# Patient Record
Sex: Male | Born: 1960
Health system: Southern US, Community
[De-identification: ages and names within clinical notes are randomized; demographics above are authoritative.]

## PROBLEM LIST (undated history)

## (undated) DIAGNOSIS — I10 Essential (primary) hypertension: Secondary | ICD-10-CM

## (undated) DIAGNOSIS — R10A1 Flank pain, right side: Secondary | ICD-10-CM

## (undated) DIAGNOSIS — M51369 Other intervertebral disc degeneration, lumbar region without mention of lumbar back pain or lower extremity pain: Secondary | ICD-10-CM

## (undated) DIAGNOSIS — K760 Fatty (change of) liver, not elsewhere classified: Secondary | ICD-10-CM

## (undated) DIAGNOSIS — R7303 Prediabetes: Secondary | ICD-10-CM

## (undated) DIAGNOSIS — I2510 Atherosclerotic heart disease of native coronary artery without angina pectoris: Secondary | ICD-10-CM

## (undated) DIAGNOSIS — K5790 Diverticulosis of intestine, part unspecified, without perforation or abscess without bleeding: Secondary | ICD-10-CM

## (undated) DIAGNOSIS — Z8711 Personal history of peptic ulcer disease: Secondary | ICD-10-CM

## (undated) DIAGNOSIS — Z8719 Personal history of other diseases of the digestive system: Secondary | ICD-10-CM

## (undated) DIAGNOSIS — E785 Hyperlipidemia, unspecified: Secondary | ICD-10-CM

## (undated) DIAGNOSIS — M5136 Other intervertebral disc degeneration, lumbar region: Secondary | ICD-10-CM

## (undated) DIAGNOSIS — H532 Diplopia: Secondary | ICD-10-CM

## (undated) DIAGNOSIS — A630 Anogenital (venereal) warts: Secondary | ICD-10-CM

## (undated) DIAGNOSIS — F39 Unspecified mood [affective] disorder: Secondary | ICD-10-CM

## (undated) DIAGNOSIS — I701 Atherosclerosis of renal artery: Secondary | ICD-10-CM

## (undated) DIAGNOSIS — R109 Unspecified abdominal pain: Secondary | ICD-10-CM

## (undated) HISTORY — DX: Diplopia: H53.2

## (undated) HISTORY — DX: Essential (primary) hypertension: I10

## (undated) HISTORY — DX: Prediabetes: R73.03

## (undated) HISTORY — DX: Personal history of peptic ulcer disease: Z87.11

## (undated) HISTORY — DX: Diverticulosis of intestine, part unspecified, without perforation or abscess without bleeding: K57.90

## (undated) HISTORY — DX: Other intervertebral disc degeneration, lumbar region without mention of lumbar back pain or lower extremity pain: M51.369

## (undated) HISTORY — DX: Atherosclerotic heart disease of native coronary artery without angina pectoris: I25.10

## (undated) HISTORY — DX: Anogenital (venereal) warts: A63.0

## (undated) HISTORY — DX: Other intervertebral disc degeneration, lumbar region: M51.36

## (undated) HISTORY — DX: Unspecified mood (affective) disorder: F39

## (undated) HISTORY — DX: Hyperlipidemia, unspecified: E78.5

## (undated) HISTORY — DX: Fatty (change of) liver, not elsewhere classified: K76.0

## (undated) HISTORY — PX: PALATE SURGERY: SHX729

## (undated) HISTORY — DX: Personal history of other diseases of the digestive system: Z87.19

## (undated) HISTORY — DX: Unspecified abdominal pain: R10.9

## (undated) HISTORY — DX: Flank pain, right side: R10.A1

## (undated) HISTORY — DX: Atherosclerosis of renal artery: I70.1

---
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
## 1981-12-11 HISTORY — PX: KNEE SURGERY: SHX244

## 2003-03-16 ENCOUNTER — Emergency Department (HOSPITAL_COMMUNITY): Admission: EM | Admit: 2003-03-16 | Discharge: 2003-03-16 | Payer: Self-pay | Admitting: Emergency Medicine

## 2005-12-11 HISTORY — PX: KNEE SURGERY: SHX244

## 2005-12-11 HISTORY — PX: COLONOSCOPY: SHX174

## 2007-08-21 ENCOUNTER — Observation Stay (HOSPITAL_COMMUNITY): Admission: AD | Admit: 2007-08-21 | Discharge: 2007-08-22 | Payer: Self-pay | Admitting: Interventional Cardiology

## 2007-08-21 ENCOUNTER — Encounter: Payer: Self-pay | Admitting: Emergency Medicine

## 2009-09-13 ENCOUNTER — Encounter: Payer: Self-pay | Admitting: Emergency Medicine

## 2009-09-13 ENCOUNTER — Observation Stay (HOSPITAL_COMMUNITY): Admission: EM | Admit: 2009-09-13 | Discharge: 2009-09-15 | Payer: Self-pay | Admitting: Cardiovascular Disease

## 2009-09-13 ENCOUNTER — Ambulatory Visit: Payer: Self-pay | Admitting: Diagnostic Radiology

## 2009-09-14 HISTORY — PX: CORONARY STENT PLACEMENT: SHX1402

## 2009-12-11 DIAGNOSIS — I701 Atherosclerosis of renal artery: Secondary | ICD-10-CM

## 2009-12-11 HISTORY — DX: Atherosclerosis of renal artery: I70.1

## 2010-02-01 ENCOUNTER — Inpatient Hospital Stay (HOSPITAL_COMMUNITY): Admission: EM | Admit: 2010-02-01 | Discharge: 2010-02-02 | Payer: Self-pay | Admitting: Emergency Medicine

## 2010-09-27 ENCOUNTER — Ambulatory Visit: Payer: Self-pay | Admitting: Family Medicine

## 2010-09-27 ENCOUNTER — Encounter
Admission: RE | Admit: 2010-09-27 | Discharge: 2010-11-23 | Payer: Self-pay | Source: Home / Self Care | Attending: Family Medicine | Admitting: Family Medicine

## 2011-01-10 NOTE — Assessment & Plan Note (Signed)
Summary: RT SHOULDER PAIN/NP/LP   Vital Signs:  Patient profile:   50 year old male Height:      67.6 inches Weight:      225 pounds BMI:     34.74 Pulse rate:   73 / minute BP sitting:   180 / 90  (left arm)  Vitals Entered By: Terese Door (September 27, 2010 1:49 PM)  Serial Vital Signs/Assessments:  Time      Position  BP       Pulse  Resp  Temp     By                     166/105                        Terese Door 2:17 PM             178/105                        Terese Door  CC: RIGHT SIDED NECK/SHOULDER PAIN   CC:  RIGHT SIDED NECK/SHOULDER PAIN.  History of Present Illness: 50 yo M here for right sided neck pain  Patient reports about 2 weeks of right sided neck pain No known injury but did work more in yard and cutting trees down the day before pain started Pain mostly posterior neck and upper back. Occasional paresthesias into right hand only. No h/o neck problems. No weakness No bowel or bladder issues. Has tried some otc nsaids but not improving. Also tried massage.   Allergies (verified): 1)  ! Penicillin  Past History:  Past Medical History: CAD, s/p stent placement HTN Renal stenosis HLD  Family History: + heart disease in siblings and father + HTN in siblings and father no DM  Social History: nonsmoker, nondrinker.  Is an EMT.  Physical Exam  General:  Well-developed,well-nourished,in no acute distress; alert,appropriate and cooperative throughout examination Msk:  Neck: No gross deformity, swelling, bruising.  Spasm in right trapezius near occiput and mid-thoracic region. TTP in these areas noted above. FROM but pain in right side of neck with right lat rotation. Strength 5/5 BUEs. Reflexes 2+ and equal in biceps, triceps, brachioradialis tendons. Negative spurlings bilaterally.  R Shoulder: Inspection reveals no abnormalities, atrophy or asymmetry. Palpation is normal with no tenderness over AC joint or bicipital groove. ROM is  full in all planes. Rotator cuff strength normal throughout. No signs of impingement with negative Neer and Hawkin's tests, empty can. Speeds and Yergason's tests normal. Normal scapular function observed. No painful arc and no drop arm sign. No apprehension sign.   Impression & Recommendations:  Problem # 1:  CERVICAL RADICULOPATHY, RIGHT (ICD-723.4) Assessment New Symptoms coming from neck - most likely radiculopathy as inciting event.  Does not remember pull within right trapezius for muscle strain to be inciting event.  Treat conservatively with PT, tylenol, heat, massage, muscle relaxant.  if not improving consider further imaging - xrays and mri scan with possible ESIs or trial of neurontin.  Complete Medication List: 1)  Flexeril 10 Mg Tabs (Cyclobenzaprine hcl) .Marland Kitchen.. 1 tab by mouth every 8 hours as needed for muscle spasms 2)  Losartan Potassium 25 Mg Tabs (Losartan potassium) 3)  Plavix 75 Mg Tabs (Clopidogrel bisulfate) 4)  Pravachol 20 Mg Tabs (Pravastatin sodium) 5)  Citalopram Hydrobromide 20 Mg Tabs (Citalopram hydrobromide) 6)  Aspirin 325 Mg Tabs (Aspirin)  Patient Instructions: 1)  Your  symptoms are related to nerve irritation coming out of your neck (responsible for the spasm, paresthesias in your arm). 2)  Initial treatment of this is conservative. 3)  Take tylenol 1000mg  three times a day as needed (avoid anti-inflammatories if possible because this will increase your blood pressure and increases the risk of heart problems albeit mildly). 4)  Take flexeril 10mg  three times a day as needed for spasms - no driving on this though because it can make you sleepy. 5)  Heat 15 minutes at a time 3-4 times a day for spasms. 6)  Massage may be helpful. 7)  Start physical therapy 2 times a week for next 4 weeks for exercises, modalities, stretches to help with this. 8)  If symptoms into arm, weakness, more persistent numbness we can try a steroid dose pack. 9)  Follow up with  me in 4 weeks. 10)  If worsening or not improving with this conservative approach, we would do x-rays and consider MRI with trial of steroid injections into the area of nerve irritation by the radiologist Prescriptions: FLEXERIL 10 MG TABS (CYCLOBENZAPRINE HCL) 1 tab by mouth every 8 hours as needed for muscle spasms  #30 x 1   Entered and Authorized by:   Norton Blizzard MD   Signed by:   Norton Blizzard MD on 09/27/2010   Method used:   Print then Give to Patient   RxID:   [id number]    Orders Added: 1)  New Patient Level III [99203]

## 2011-03-01 LAB — DIFFERENTIAL
Basophils Relative: 0 % (ref 0–1)
Lymphocytes Relative: 26 % (ref 12–46)
Lymphs Abs: 1.7 10*3/uL (ref 0.7–4.0)
Monocytes Relative: 6 % (ref 3–12)
Neutro Abs: 4.1 10*3/uL (ref 1.7–7.7)

## 2011-03-01 LAB — POCT CARDIAC MARKERS: Troponin i, poc: <0.05 ng/mL (ref 0.00–0.09)

## 2011-03-01 LAB — COMPREHENSIVE METABOLIC PANEL
AST: 21 U/L (ref 0–37)
Albumin: 4.2 g/dL (ref 3.5–5.2)
Alkaline Phosphatase: 38 U/L — ABNORMAL LOW (ref 39–117)
BUN: 10 mg/dL (ref 6–23)
CO2: 25 mEq/L (ref 19–32)
Creatinine, Ser: 1.08 mg/dL (ref 0.4–1.5)
GFR calc Af Amer: >60 mL/min (ref >60)
Glucose, Bld: 97 mg/dL (ref 70–99)
Sodium: 137 mEq/L (ref 135–145)
Total Bilirubin: 1.4 mg/dL — ABNORMAL HIGH (ref 0.3–1.2)
Total Protein: 7.1 g/dL (ref 6.0–8.3)

## 2011-03-01 LAB — CK TOTAL AND CKMB (NOT AT ARMC)
CK, MB: 1.7 ng/mL (ref 0.3–4.0)
Total CK: 201 U/L (ref 7–232)

## 2011-03-01 LAB — CBC
HCT: 41.1 % (ref 39.0–52.0)
Platelets: 161 10*3/uL (ref 150–400)
Platelets: 169 10*3/uL (ref 150–400)
RBC: 4.68 MIL/uL (ref 4.22–5.81)
RDW: 13.2 % (ref 11.5–15.5)
WBC: 6.3 10*3/uL (ref 4.0–10.5)
WBC: 6.4 10*3/uL (ref 4.0–10.5)

## 2011-03-01 LAB — CARDIAC PANEL(CRET KIN+CKTOT+MB+TROPI)
CK, MB: 1.6 ng/mL (ref 0.3–4.0)
CK, MB: 1.6 ng/mL (ref 0.3–4.0)
Relative Index: 0.9 (ref 0.0–2.5)
Relative Index: 1.2 (ref 0.0–2.5)
Total CK: 174 U/L (ref 7–232)

## 2011-03-01 LAB — LIPID PANEL: HDL: 39 mg/dL — ABNORMAL LOW (ref >39)

## 2011-03-01 LAB — BASIC METABOLIC PANEL
CO2: 26 mEq/L (ref 19–32)
Calcium: 9.2 mg/dL (ref 8.4–10.5)
Chloride: 104 mEq/L (ref 96–112)
GFR calc Af Amer: >60 mL/min (ref >60)
GFR calc non Af Amer: >60 mL/min (ref >60)
Potassium: 3.6 mEq/L (ref 3.5–5.1)
Sodium: 138 mEq/L (ref 135–145)

## 2011-03-01 LAB — PROTIME-INR: INR: 1.03 (ref 0.00–1.49)

## 2011-03-01 LAB — APTT: aPTT: 28 seconds (ref 24–37)

## 2011-03-16 LAB — OPIATE, QUANTITATIVE, URINE
Codeine Urine: NEGATIVE ng/mL
Hydrocodone: NEGATIVE ng/mL
Hydromorphone GC/MS Conf: NEGATIVE ng/mL
Morphine, Confirm: 2990 ng/mL
Oxymorphone: NEGATIVE ng/mL

## 2011-03-16 LAB — LIPID PANEL: Triglycerides: 180 mg/dL — ABNORMAL HIGH (ref <150)

## 2011-03-16 LAB — DRUGS OF ABUSE SCREEN W/O ALC, ROUTINE URINE
Barbiturate Quant, Ur: NEGATIVE
Benzodiazepines.: NEGATIVE
Cocaine Metabolites: NEGATIVE
Methadone: NEGATIVE
Propoxyphene: NEGATIVE

## 2011-03-16 LAB — HEMOGLOBIN A1C: Mean Plasma Glucose: 128 mg/dL

## 2011-03-16 LAB — CBC
HCT: 37 % — ABNORMAL LOW (ref 39.0–52.0)
HCT: 37.5 % — ABNORMAL LOW (ref 39.0–52.0)
MCHC: 34.1 g/dL (ref 30.0–36.0)
MCV: 87.3 fL (ref 78.0–100.0)
MCV: 87.3 fL (ref 78.0–100.0)
MCV: 87.1 fL (ref 78.0–100.0)
Platelets: 168 10*3/uL (ref 150–400)
Platelets: 185 10*3/uL (ref 150–400)
Platelets: 209 10*3/uL (ref 150–400)
RBC: 5.03 MIL/uL (ref 4.22–5.81)
RDW: 13.5 % (ref 11.5–15.5)
WBC: 7.3 10*3/uL (ref 4.0–10.5)
WBC: 7.6 10*3/uL (ref 4.0–10.5)
WBC: 8.4 10*3/uL (ref 4.0–10.5)

## 2011-03-16 LAB — BASIC METABOLIC PANEL
BUN: 11 mg/dL (ref 6–23)
BUN: 17 mg/dL (ref 6–23)
BUN: 17 mg/dL (ref 6–23)
CO2: 29 mEq/L (ref 19–32)
Calcium: 9.1 mg/dL (ref 8.4–10.5)
Calcium: 10 mg/dL (ref 8.4–10.5)
Chloride: 108 mEq/L (ref 96–112)
Chloride: 102 mEq/L (ref 96–112)
Creatinine, Ser: 1.11 mg/dL (ref 0.4–1.5)
Creatinine, Ser: 1.15 mg/dL (ref 0.4–1.5)
Creatinine, Ser: 1.2 mg/dL (ref 0.4–1.5)
GFR calc Af Amer: >60 mL/min (ref >60)
GFR calc non Af Amer: >60 mL/min (ref >60)
GFR calc non Af Amer: >60 mL/min (ref >60)
GFR calc non Af Amer: >60 mL/min (ref >60)
Glucose, Bld: 109 mg/dL — ABNORMAL HIGH (ref 70–99)
Glucose, Bld: 114 mg/dL — ABNORMAL HIGH (ref 70–99)
Potassium: 3.4 mEq/L — ABNORMAL LOW (ref 3.5–5.1)
Sodium: 139 mEq/L (ref 135–145)

## 2011-03-16 LAB — PROTIME-INR: Prothrombin Time: 12.1 seconds (ref 11.6–15.2)

## 2011-03-16 LAB — URINALYSIS, MICROSCOPIC ONLY
Hgb urine dipstick: NEGATIVE
Nitrite: NEGATIVE
Specific Gravity, Urine: 1.024 (ref 1.005–1.030)
Urobilinogen, UA: 0.2 mg/dL (ref 0.0–1.0)
pH: 5.5 (ref 5.0–8.0)

## 2011-03-16 LAB — DIFFERENTIAL
Eosinophils Absolute: 0.2 10*3/uL (ref 0.0–0.7)
Lymphocytes Relative: 29 % (ref 12–46)
Lymphs Abs: 2.4 10*3/uL (ref 0.7–4.0)
Monocytes Relative: 5 % (ref 3–12)
Neutro Abs: 5.3 10*3/uL (ref 1.7–7.7)
Neutrophils Relative %: 63 % (ref 43–77)

## 2011-03-16 LAB — CARDIAC PANEL(CRET KIN+CKTOT+MB+TROPI)
CK, MB: 3 ng/mL (ref 0.3–4.0)
CK, MB: 3.9 ng/mL (ref 0.3–4.0)
Relative Index: 3.2 — ABNORMAL HIGH (ref 0.0–2.5)
Troponin I: 0.31 ng/mL — ABNORMAL HIGH (ref 0.00–0.06)
Troponin I: 0.38 ng/mL — ABNORMAL HIGH (ref 0.00–0.06)

## 2011-03-16 LAB — POCT CARDIAC MARKERS
CKMB, poc: 3.1 ng/mL (ref 1.0–8.0)
Myoglobin, poc: 86.5 ng/mL (ref 12–200)
Troponin i, poc: 0.15 ng/mL — ABNORMAL HIGH (ref 0.00–0.09)

## 2011-03-16 LAB — D-DIMER, QUANTITATIVE: D-Dimer, Quant: 0.26 ug/mL-FEU (ref 0.00–0.48)

## 2011-03-16 LAB — HEPARIN LEVEL (UNFRACTIONATED): Heparin Unfractionated: 0.6 IU/mL (ref 0.30–0.70)

## 2011-04-25 NOTE — Discharge Summary (Signed)
NAMENALU, TROUBLEFIELD            ACCOUNT NO.:  [id number]   MEDICAL RECORD NO.:  [id number]          PATIENT TYPE:  INP   LOCATION:  4735                         FACILITY:  [facility]   PHYSICIAN:  Corky Crafts, MDDATE OF BIRTH:  03/05/1961   DATE OF ADMISSION:  08/21/2007  DATE OF DISCHARGE:  08/22/2007                               DISCHARGE SUMMARY   DISCHARGE DIAGNOSES:  1. Chest pain consistent with unstable angina.  2. Hypertension.  3. Metabolic syndrome.  4. Family history coronary artery disease.   HISTORY OF PRESENT ILLNESS:  Mr. Elwood is a 50 year old male  patient who had told a history of nausea, vomiting and diarrhea.  On the  night prior to admission, he developed substernal chest pressure,  radiating into the left shoulder and neck.  He has had intermittent  symptoms and then presented to the emergency room.  In the emergency  room, he was found to be bradycardic with a heart rate in the 40s.  He  did present [facility] and was then subsequently sent over to  [facility] for careful cardiac monitoring.   Cardiac enzymes were negative.  BUN 13, creatinine 1.0.  Hemoglobin  14.2, hematocrit 41.1.  Total cholesterol 167, LDL 91, HDL 24,  triglycerides 260.   He was pain free by the day of discharge.  We have scheduled him for an  outpatient Cardiolite on August 28, 2007, at 1 p.m.  He is not to eat  12 hours before the stress test.   MEDICATIONS:  1. Enteric-coated aspirin 325 mg a day.  2. Norvasc 5 mg a day for blood pressure.  3. Sublingual nitroglycerin p.r.n. chest pain.  4. Fish oil 1000 mg p.o. b.i.d.  5. He is not to take his bystolic.  We felt this medication had caused      bradycardia.   DIET:  Remain on a low-sodium, heart-healthy diet.   ACTIVITY:  Increase activity slowly.   Call for any questions or concerns.      Guy Franco, P.A.      Corky Crafts, MD  Electronically Signed    LB/MEDQ  D:   08/22/2007  T:  08/22/2007  Job:  [id number]   cc:   Elana Alm. Nicholos Johns, M.D.

## 2011-08-01 ENCOUNTER — Ambulatory Visit (INDEPENDENT_AMBULATORY_CARE_PROVIDER_SITE_OTHER): Payer: 59 | Admitting: Family Medicine

## 2011-08-01 ENCOUNTER — Encounter: Payer: Self-pay | Admitting: Family Medicine

## 2011-08-01 VITALS — BP 132/89 | HR 71 | Temp 97.8°F | Ht 67.0 in | Wt 230.0 lb

## 2011-08-01 DIAGNOSIS — M25562 Pain in left knee: Secondary | ICD-10-CM

## 2011-08-01 DIAGNOSIS — M25569 Pain in unspecified knee: Secondary | ICD-10-CM

## 2011-08-01 NOTE — Assessment & Plan Note (Signed)
consistent with degenerative medial meniscal tear.  He will drop off a CD of his x-ray images for me to review.  ACE wrap, icing, elevation for swelling.  Cortisone injection may still help him.  Will start PT and home exercises with aggressive quad strengthening.  If not improving over next month, would then consider MRI and possible arthroscopy (especially if his occasional mechanical symptoms continue despite PT).

## 2011-08-01 NOTE — Patient Instructions (Signed)
Your history and exam are consistent with either arthritis on the inside of your knee or, more likely, a degenerative meniscal tear. Ice your knee 15 minutes at a time 3-4 times a day as needed for swelling. ACE wrap and elevation to help keep swelling down. The cortisone injection may still take effect in calming down the inflammation from this. Start physical therapy and home exercises for the next 6 weeks. Follow up with me in 1 month - if not improving, next step would be to do an MRI and you may then need a scope to clean out a meniscal tear. When you get a chance, get a copy of your x-rays on a CD and drop the CD off up front for me. Glucosamine sulfate 750mg  twice a day is a supplement that has been shown to help moderate to severe arthritis - this is very safe if you're not allergic to sulfa but generally would not help a meniscal tear.

## 2011-08-01 NOTE — Progress Notes (Signed)
  Subjective:    Patient ID: Eric Hunter, male    DOB: Aug 22, 1961, 50 y.o.   MRN: [id number]  HPI 50 yo M here for left knee pain.  Patient reports no known injury. States about 3 months ago started to get anteromedial left knee pain when using stairs. Started as an ache that has progressively worsened since then, worse with activity. Knee swelling up now. Feels more unstable, occasional catching/locking as well. Has been icing and taking ibuprofen. Went to Dr. Althea Charon last Wednesday - told x-rays showed mild spur behind patella but no mention of medial arthritis - had 30cc drained from knee and cortisone injection. Has not had much pain relief from this and knee is swelling again. No remote surgeries or injuries either.  Past Medical History  Diagnosis Date  . Coronary artery disease   . Hypertension   . Renal artery stenosis   . Hyperlipidemia     No current outpatient prescriptions on file prior to visit.    Past Surgical History  Procedure Date  . Coronary stent placement     Allergies  Allergen Reactions  . Penicillins     History   Social History  . Marital Status: Single    Spouse Name: N/A    Number of Children: N/A  . Years of Education: N/A   Occupational History  . Not on file.   Social History Main Topics  . Smoking status: Never Smoker   . Smokeless tobacco: Not on file  . Alcohol Use: Not on file  . Drug Use: Not on file  . Sexually Active: Not on file   Other Topics Concern  . Not on file   Social History Narrative  . No narrative on file    Family History  Problem Relation Age of Onset  . Heart attack Father   . Hypertension Father   . Heart attack Sister   . Hypertension Sister   . Heart attack Brother   . Hypertension Brother   . Diabetes Neg Hx     BP 132/89  Pulse 71  Temp(Src) 97.8 F (36.6 C) (Oral)  Ht 5\' 7"  (1.702 m)  Wt 230 lb (104.327 kg)  BMI 36.02 kg/m2  Review of Systems See HPI above.    Objective:    Physical Exam Gen: NAD L knee: Mild effusion.  No gross deformity, ecchymoses, warmth. Mod medial joint line TTP.  No lateral joint line, post patellar facet, pes, other TTP about knee. FROM - tight with full flexion. Negative ant/post drawers. Negative valgus/varus testing. Negative lachmanns. Positive mcmurrays medially.  Positive apleys medially.  Negative patellar apprehension, clarkes. NV intact distally.  R knee: FROM without pain.    Assessment & Plan:  1. Left knee pain - consistent with degenerative medial meniscal tear.  He will drop off a CD of his x-ray images for me to review.  ACE wrap, icing, elevation for swelling.  Cortisone injection may still help him.  Will start PT and home exercises with aggressive quad strengthening.  If not improving over next month, would then consider MRI and possible arthroscopy (especially if his occasional mechanical symptoms continue despite PT).

## 2011-08-08 ENCOUNTER — Ambulatory Visit: Payer: 59 | Attending: Family Medicine | Admitting: Physical Therapy

## 2011-09-01 ENCOUNTER — Ambulatory Visit: Payer: 59 | Admitting: Family Medicine

## 2011-09-22 LAB — CARDIAC PANEL(CRET KIN+CKTOT+MB+TROPI)
CK, MB: 2.3
Relative Index: INVALID
Troponin I: 0.02

## 2011-09-22 LAB — HEPATIC FUNCTION PANEL
ALT: 33
AST: 21
Alkaline Phosphatase: 34 — ABNORMAL LOW
Bilirubin, Direct: 0.1
Total Bilirubin: 1.2

## 2011-09-22 LAB — PROTIME-INR: INR: 1

## 2011-09-22 LAB — TSH: TSH: 2.187

## 2011-09-22 LAB — BASIC METABOLIC PANEL
CO2: 24
Calcium: 9.6
GFR calc Af Amer: >60
GFR calc non Af Amer: >60
Glucose, Bld: 123 — ABNORMAL HIGH
Potassium: 3.9
Sodium: 140

## 2011-09-22 LAB — POCT CARDIAC MARKERS
CKMB, poc: 1
CKMB, poc: <1 — ABNORMAL LOW
Myoglobin, poc: 58.4
Troponin i, poc: <0.05
Troponin i, poc: <0.05

## 2011-09-22 LAB — LIPID PANEL
Cholesterol: 167
HDL: 24 — ABNORMAL LOW
LDL Cholesterol: 91
Total CHOL/HDL Ratio: 7

## 2011-09-22 LAB — DIFFERENTIAL
Eosinophils Relative: 3
Lymphocytes Relative: 29
Monocytes Absolute: 0.5
Monocytes Relative: 9
Neutro Abs: 3.1

## 2011-09-22 LAB — CBC
HCT: 41.1
Hemoglobin: 14.2
MCHC: 34.6
RBC: 4.87

## 2012-07-17 ENCOUNTER — Encounter (HOSPITAL_BASED_OUTPATIENT_CLINIC_OR_DEPARTMENT_OTHER): Payer: Self-pay | Admitting: *Deleted

## 2012-07-17 ENCOUNTER — Emergency Department (HOSPITAL_BASED_OUTPATIENT_CLINIC_OR_DEPARTMENT_OTHER): Payer: 59

## 2012-07-17 ENCOUNTER — Emergency Department (HOSPITAL_BASED_OUTPATIENT_CLINIC_OR_DEPARTMENT_OTHER)
Admission: EM | Admit: 2012-07-17 | Discharge: 2012-07-17 | Disposition: A | Discharge status: Home / Self Care | Payer: 59 | Attending: Emergency Medicine | Admitting: Emergency Medicine

## 2012-07-17 DIAGNOSIS — R61 Generalized hyperhidrosis: Secondary | ICD-10-CM | POA: Insufficient documentation

## 2012-07-17 DIAGNOSIS — R110 Nausea: Secondary | ICD-10-CM | POA: Insufficient documentation

## 2012-07-17 DIAGNOSIS — E785 Hyperlipidemia, unspecified: Secondary | ICD-10-CM | POA: Insufficient documentation

## 2012-07-17 DIAGNOSIS — I2510 Atherosclerotic heart disease of native coronary artery without angina pectoris: Secondary | ICD-10-CM | POA: Insufficient documentation

## 2012-07-17 DIAGNOSIS — Z79899 Other long term (current) drug therapy: Secondary | ICD-10-CM | POA: Insufficient documentation

## 2012-07-17 DIAGNOSIS — I10 Essential (primary) hypertension: Secondary | ICD-10-CM | POA: Insufficient documentation

## 2012-07-17 DIAGNOSIS — N200 Calculus of kidney: Secondary | ICD-10-CM | POA: Insufficient documentation

## 2012-07-17 DIAGNOSIS — R109 Unspecified abdominal pain: Secondary | ICD-10-CM | POA: Insufficient documentation

## 2012-07-17 LAB — URINALYSIS, ROUTINE W REFLEX MICROSCOPIC
Ketones, ur: NEGATIVE mg/dL
Leukocytes, UA: NEGATIVE
Nitrite: NEGATIVE
Specific Gravity, Urine: 1.026 (ref 1.005–1.030)
pH: 7 (ref 5.0–8.0)

## 2012-07-17 LAB — BASIC METABOLIC PANEL
Calcium: 9.7 mg/dL (ref 8.4–10.5)
Creatinine, Ser: 1.1 mg/dL (ref 0.50–1.35)
GFR calc Af Amer: 87 mL/min — ABNORMAL LOW (ref >90)

## 2012-07-17 MED ORDER — OXYCODONE-ACETAMINOPHEN 5-325 MG PO TABS: 1.0000 | ORAL_TABLET | ORAL | Status: DC | PRN

## 2012-07-17 MED ORDER — KETOROLAC TROMETHAMINE 30 MG/ML IJ SOLN
30.0000 mg | Freq: Once | INTRAMUSCULAR | Status: AC
  Administered 2012-07-17: 30 mg via INTRAVENOUS
  Filled 2012-07-17: qty 1

## 2012-07-17 MED ORDER — NAPROXEN 500 MG PO TABS: 500.0000 mg | ORAL_TABLET | Freq: Two times a day (BID) | ORAL | Status: DC

## 2012-07-17 MED ORDER — PROMETHAZINE HCL 25 MG PO TABS: 25.0000 mg | ORAL_TABLET | Freq: Four times a day (QID) | ORAL | Status: DC | PRN

## 2012-07-17 NOTE — ED Notes (Signed)
Pt to room 8 by ems, pt is awake and alert, reports sudden onset of right flank pain  While mowing his grass just pta. ems reports pt was pale, diaphoretic on their arrival. #20 in place to right hand, ems gave 200 mcg of fentanyl en route. Pt reports pain "is much better now."

## 2012-07-17 NOTE — ED Provider Notes (Signed)
History     CSN: [id number]  Arrival date & time 07/17/12  1901   First MD Initiated Contact with Patient 07/17/12 1938      Chief Complaint  Patient presents with  . Flank Pain    (Consider location/radiation/quality/duration/timing/severity/associated sxs/prior treatment) HPI Comments: 51 year old with a history of coronary disease, hypertension and hyperlipidemia who presents with a complaint of acute onset of right flank pain which was sharp and stabbing, occurred at rest and there is no comfortable position with which he could find. He has associated nausea and diaphoresis but no vomiting. He denies fevers chills coughing shortness of breath abdominal pain, dysuria, hematuria, swelling, rashes. Paramedics gave the patient fentanyl and route with complete resolution of his pain. It has not recurred since that time. He has no history of kidney stones we does have intermittent diverticulitis causing left lower quadrant discomfort. He did have a bout of this last week. He denies changes in his bowel habits his  Patient is a 51 y.o. male presenting with flank pain. The history is provided by the patient, the EMS personnel and the spouse.  Flank Pain    Past Medical History  Diagnosis Date  . Coronary artery disease   . Hypertension   . Renal artery stenosis   . Hyperlipidemia     Past Surgical History  Procedure Date  . Coronary stent placement     Family History  Problem Relation Age of Onset  . Heart attack Father   . Hypertension Father   . Heart attack Sister   . Hypertension Sister   . Heart attack Brother   . Hypertension Brother   . Diabetes Neg Hx     History  Substance Use Topics  . Smoking status: Never Smoker   . Smokeless tobacco: Not on file  . Alcohol Use: Not on file      Review of Systems  Genitourinary: Positive for flank pain.  All other systems reviewed and are negative.    Allergies  Penicillins  Home Medications   Current  Outpatient Rx  Name Route Sig Dispense Refill  . ASPIRIN 325 MG PO TABS Oral Take 325 mg by mouth daily.    Marland Kitchen CITALOPRAM HYDROBROMIDE 20 MG PO TABS Oral Take 20 mg by mouth daily.    Marland Kitchen CLOPIDOGREL BISULFATE 75 MG PO TABS Oral Take 75 mg by mouth daily.     Marland Kitchen LOSARTAN POTASSIUM 25 MG PO TABS Oral Take 25 mg by mouth daily.     . CENTRUM SILVER ULTRA MENS PO Oral Take 1 tablet by mouth daily.    Marland Kitchen CITALOPRAM HYDROBROMIDE 20 MG PO TABS Oral Take 20 mg by mouth daily.      Marland Kitchen NAPROXEN 500 MG PO TABS Oral Take 1 tablet (500 mg total) by mouth 2 (two) times daily with a meal. 30 tablet 0  . OXYCODONE-ACETAMINOPHEN 5-325 MG PO TABS Oral Take 1 tablet by mouth every 4 (four) hours as needed for pain. 20 tablet 0  . PRAVASTATIN SODIUM 20 MG PO TABS Oral Take 20 mg by mouth daily.      Marland Kitchen PROMETHAZINE HCL 25 MG PO TABS Oral Take 1 tablet (25 mg total) by mouth every 6 (six) hours as needed for nausea. 12 tablet 0    BP 154/78  Pulse 63  Temp 97.7 F (36.5 C) (Oral)  Resp 18  Ht 5\' 7"  (1.702 m)  Wt 214 lb (97.07 kg)  BMI 33.52 kg/m2  SpO2 96%  Physical  Exam  Nursing note and vitals reviewed. Constitutional: He appears well-developed and well-nourished. No distress.  HENT:  Head: Normocephalic and atraumatic.  Mouth/Throat: Oropharynx is clear and moist. No oropharyngeal exudate.  Eyes: Conjunctivae and EOM are normal. Pupils are equal, round, and reactive to light. Right eye exhibits no discharge. Left eye exhibits no discharge. No scleral icterus.  Neck: Normal range of motion. Neck supple. No JVD present. No thyromegaly present.  Cardiovascular: Normal rate, regular rhythm, normal heart sounds and intact distal pulses.  Exam reveals no gallop and no friction rub.   No murmur heard. Pulmonary/Chest: Effort normal and breath sounds normal. No respiratory distress. He has no wheezes. He has no rales.  Abdominal: Soft. Bowel sounds are normal. He exhibits no distension and no mass. There is no  tenderness.       No abdominal tenderness, soft and no peritoneal signs, no CVA tenderness  Musculoskeletal: Normal range of motion. He exhibits no edema and no tenderness.  Lymphadenopathy:    He has no cervical adenopathy.  Neurological: He is alert. Coordination normal.  Skin: Skin is warm and dry. No rash noted. No erythema.  Psychiatric: He has a normal mood and affect. His behavior is normal.    ED Course  Procedures (including critical care time)  Labs Reviewed  BASIC METABOLIC PANEL - Abnormal; Notable for the following:    Glucose, Bld 106 (*)     GFR calc non Af Amer 75 (*)     GFR calc Af Amer 87 (*)     All other components within normal limits  URINALYSIS, ROUTINE W REFLEX MICROSCOPIC   Ct Abdomen Pelvis Wo Contrast  07/17/2012  *RADIOLOGY REPORT*  Clinical Data: Right flank pain  CT ABDOMEN AND PELVIS WITHOUT CONTRAST  Technique:  Multidetector CT imaging of the abdomen and pelvis was performed following the standard protocol without intravenous contrast.  Comparison: None.  Findings: Lung bases are unremarkable.  Sagittal images of the spine shows disc space flattening at T12 L1 T11-T12 L3-L4 and L4-L5 level.  Unenhanced liver is unremarkable.  No calcified gallstones are noted within gallbladder.  Unenhanced pancreas, spleen and adrenal glands are unremarkable.  Unenhanced kidneys are symmetrical in size.  No nephrolithiasis.  No calcified ureteral calculi are noted bilaterally.  No hydronephrosis or hydroureter.  Multiple colonic diverticula are noted without evidence of acute diverticulitis.  No pericecal inflammation.  Normal appendix is clearly visualized.  No aortic aneurysm.  No small bowel obstruction.  No ascites or free air.  No adenopathy.  Prostate gland and seminal vesicles are unremarkable.  The urinary bladder is unremarkable.  Bilateral distal ureter is unremarkable. Tiny prostate gland calcification measures 3 mm.  IMPRESSION:  1.  No nephrolithiasis.  No  hydronephrosis or hydroureter. 2.  No calcified ureteral calculi are noted. 3.  Colonic diverticula are noted without evidence of acute diverticulitis. 4.  Normal appendix is clearly visualized. 5.  Degenerative changes thoracolumbar spine.  Original Report Authenticated By: Natasha Mead, M.D.     1. Kidney stone       MDM  At this time the patient is well-appearing, he has no symptoms, will give Toradol, short observation, I have given the patient the option of doing a CT scan to further evaluate for what is likely a kidney stone.  CT scan reviewed, no signs of acute kidney stone or obstruction. Kidney function is baseline, urinalysis is clean and the patient has remained asymptomatic. Home with medications and Uro f/u as needed  Discharge Prescriptions include:  Naprosyn Percocet phenergan      Vida Roller, MD 07/17/12 2114

## 2012-07-19 ENCOUNTER — Encounter (HOSPITAL_BASED_OUTPATIENT_CLINIC_OR_DEPARTMENT_OTHER): Payer: Self-pay

## 2012-07-19 ENCOUNTER — Emergency Department (HOSPITAL_BASED_OUTPATIENT_CLINIC_OR_DEPARTMENT_OTHER)
Admission: EM | Admit: 2012-07-19 | Discharge: 2012-07-19 | Disposition: A | Discharge status: Home / Self Care | Payer: 59 | Attending: Emergency Medicine | Admitting: Emergency Medicine

## 2012-07-19 DIAGNOSIS — R109 Unspecified abdominal pain: Secondary | ICD-10-CM

## 2012-07-19 DIAGNOSIS — Z87891 Personal history of nicotine dependence: Secondary | ICD-10-CM | POA: Insufficient documentation

## 2012-07-19 DIAGNOSIS — Z9861 Coronary angioplasty status: Secondary | ICD-10-CM | POA: Insufficient documentation

## 2012-07-19 DIAGNOSIS — E785 Hyperlipidemia, unspecified: Secondary | ICD-10-CM | POA: Insufficient documentation

## 2012-07-19 DIAGNOSIS — I2510 Atherosclerotic heart disease of native coronary artery without angina pectoris: Secondary | ICD-10-CM | POA: Insufficient documentation

## 2012-07-19 DIAGNOSIS — I10 Essential (primary) hypertension: Secondary | ICD-10-CM | POA: Insufficient documentation

## 2012-07-19 MED ORDER — FENTANYL CITRATE 0.05 MG/ML IJ SOLN
100.0000 ug | Freq: Once | INTRAMUSCULAR | Status: AC
  Administered 2012-07-19: 100 ug via INTRAVENOUS

## 2012-07-19 MED ORDER — ONDANSETRON HCL 4 MG/2ML IJ SOLN
INTRAMUSCULAR | Status: AC
  Filled 2012-07-19: qty 2

## 2012-07-19 MED ORDER — LORAZEPAM 1 MG PO TABS: 1.0000 mg | ORAL_TABLET | Freq: Four times a day (QID) | ORAL | Status: AC | PRN

## 2012-07-19 MED ORDER — LORAZEPAM 2 MG/ML IJ SOLN
2.0000 mg | Freq: Once | INTRAMUSCULAR | Status: AC
  Administered 2012-07-19: 2 mg via INTRAVENOUS

## 2012-07-19 MED ORDER — ONDANSETRON HCL 4 MG/2ML IJ SOLN
4.0000 mg | Freq: Once | INTRAMUSCULAR | Status: AC
  Administered 2012-07-19: 4 mg via INTRAVENOUS

## 2012-07-19 MED ORDER — LORAZEPAM 2 MG/ML IJ SOLN
INTRAMUSCULAR | Status: AC
  Filled 2012-07-19: qty 1

## 2012-07-19 MED ORDER — FENTANYL CITRATE 0.05 MG/ML IJ SOLN
INTRAMUSCULAR | Status: AC
  Filled 2012-07-19: qty 2

## 2012-07-19 MED ORDER — FENTANYL CITRATE 0.05 MG/ML IJ SOLN
100.0000 ug | Freq: Once | INTRAMUSCULAR | Status: AC
  Administered 2012-07-19: 100 ug via INTRAVENOUS
  Filled 2012-07-19: qty 2

## 2012-07-19 NOTE — ED Provider Notes (Signed)
History     CSN: [id number]  Arrival date & time 07/19/12  1137   First MD Initiated Contact with Patient 07/19/12 1144      Chief Complaint  Patient presents with  . Flank Pain    (Consider location/radiation/quality/duration/timing/severity/associated sxs/prior treatment) HPI This 51 year old male complains of 2 days of intermittent right flank severe colicky pain associated with nausea and sweats without fever cough chest pain shortness breath abdominal pain or IVDA. He has no midline back pain. He is no radiation down his legs or weakness or numbness. There is no trauma. He was seen yesterday in the emergency room with unremarkable urine and blood counts and CT scan showing no evidence of kidney stones or hydronephrosis. He had normal appendix as well. He was discharged with prescription for Percocet. Today he had sudden onset of severe colicky right flank pain again so returns to the emergency department screaming and writhing in pain. He was given Toradol just prior to arrival by EMS without relief he took a Percocet earlier today at home without relief.  Past Medical History  Diagnosis Date  . Coronary artery disease   . Hypertension   . Renal artery stenosis   . Hyperlipidemia     Past Surgical History  Procedure Date  . Coronary stent placement     Family History  Problem Relation Age of Onset  . Heart attack Father   . Hypertension Father   . Heart attack Sister   . Hypertension Sister   . Heart attack Brother   . Hypertension Brother   . Diabetes Neg Hx     History  Substance Use Topics  . Smoking status: Former Games developer  . Smokeless tobacco: Not on file  . Alcohol Use: No      Review of Systems 10 Systems reviewed and are negative for acute change except as noted in the HPI. Allergies  Penicillins  Home Medications   Current Outpatient Rx  Name Route Sig Dispense Refill  . ASPIRIN 325 MG PO TABS Oral Take 325 mg by mouth daily.    Marland Kitchen CITALOPRAM  HYDROBROMIDE 20 MG PO TABS Oral Take 20 mg by mouth daily.     Marland Kitchen CLOPIDOGREL BISULFATE 75 MG PO TABS Oral Take 75 mg by mouth daily.     Marland Kitchen HYDROMORPHONE HCL 2 MG PO TABS Oral Take 1 tablet (2 mg total) by mouth every 4 (four) hours as needed for pain. 20 tablet 0  . LORAZEPAM 1 MG PO TABS Oral Take 1 tablet (1 mg total) by mouth every 6 (six) hours as needed. 10 tablet 0  . LOSARTAN POTASSIUM 25 MG PO TABS Oral Take 25 mg by mouth daily.     Marland Kitchen NAPROXEN 500 MG PO TABS Oral Take 500 mg by mouth 2 (two) times daily with a meal.    . OXYCODONE-ACETAMINOPHEN 5-325 MG PO TABS Oral Take 1 tablet by mouth every 4 (four) hours as needed. For pain    . PRAVASTATIN SODIUM 20 MG PO TABS Oral Take 20 mg by mouth every evening.     Marland Kitchen PREDNISONE 20 MG PO TABS  3 po once a day for 2 days, then 2 po once a day for 3 days, then 1 po once a day for 3 days 15 tablet 0  . PROMETHAZINE HCL 25 MG PO TABS Oral Take 1 tablet (25 mg total) by mouth every 6 (six) hours as needed for nausea. 12 tablet 0    BP 135/85  Pulse  63  Resp 26  SpO2 100%  Physical Exam  Nursing note and vitals reviewed. Constitutional:       Awake, alert, nontoxic but uncomfortable appearance.  HENT:  Head: Atraumatic.  Eyes: Right eye exhibits no discharge. Left eye exhibits no discharge.  Neck: Neck supple.  Cardiovascular: Normal rate and regular rhythm.   No murmur heard. Pulmonary/Chest: Effort normal and breath sounds normal. No respiratory distress. He has no wheezes. He has no rales. He exhibits no tenderness.  Abdominal: Soft. There is no tenderness. There is no rebound.  Musculoskeletal: He exhibits no edema and no tenderness.       Baseline ROM, no obvious new focal weakness. Dorsalis pedis pulses intact both feet. Both feet have normal light touch with capillary refill less than 2 seconds and good movement. Both legs are nontender and have good movement with no obvious weakness. His back has no midline tenderness. His right  paralumbar region is tender to palpation as well as percussion. His left paralumbar region and thoracic spine are nontender.  Neurological:       Mental status and motor strength appears baseline for patient and situation.  Skin: No rash noted.  Psychiatric: He has a normal mood and affect.    ED Course  Procedures (including critical care time) Much better, minimally tender right paralumbar, suspect muscle spasm most likely & Pt agrees Labs Reviewed - No data to display No results found.   1. Flank pain       MDM  I doubt any other EMC precluding discharge at this time including, but not necessarily limited to the following: AAA, SBI, cauda equina.        Hurman Horn, MD 07/20/12 [phone]

## 2012-07-19 NOTE — ED Notes (Signed)
Pt apologized for his behavior/language upon arrival-NAD

## 2012-07-19 NOTE — ED Notes (Signed)
Cheryll Dessert, charge nurse notified by Ethelene Browns, security that wife came for pt

## 2012-07-19 NOTE — ED Notes (Signed)
Pt wife called requesting to speak with Dr who treated pt in ED today. Pt gave verbal approval for this nurse and provider to speak with wife regarding his care. Dr. Fonnie Jarvis agreed to return call to wife when possible.

## 2012-07-19 NOTE — ED Notes (Signed)
[facility] report-pt brought from [facility] with c/o right flank pain-dx with kidney stone yesterday-pt is screaming, writhing upon arrival-EMS started IV to right hand and gave fentanyl 50mg -? IV infiltated

## 2012-07-19 NOTE — ED Notes (Signed)
Pt is now more calm-states pain started back at 7am today-states he went to PCP office-EMS was called in to bring him to ED

## 2012-07-19 NOTE — ED Notes (Signed)
Advised by Doreatha Martin, security that pt had rx filled and was lying outside pharmacy-was advised he could not lie in that area-pt walked outside ED and laid on the grass-I went to check on pt-pt lying in grass at sidewalk-advised he can not lie outside due to unsafe and advised to come back in and asked when his wife would be here for ride home-pt A/O screaming at me and security guard-states he can not come back in and sit in ED WR due to pain-sitting increases his pain-states wife "will be here when she gets here"-pt crossed road to lie on opposite side of road-advised again lying outside is unsafe and it is beginning to rain-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

## 2012-07-19 NOTE — ED Notes (Signed)
Pt requested to speak to EDP prior to d/c-EDP Bednar back in to talk with pt-pt states he has called his wife for ride home

## 2012-07-19 NOTE — ED Notes (Signed)
Pt resting-in to d/c-pt requested-NAD-requested pain med prior to IV d/c-EDP Bednar notified

## 2012-07-20 ENCOUNTER — Emergency Department (HOSPITAL_COMMUNITY)
Admission: EM | Admit: 2012-07-20 | Discharge: 2012-07-20 | Disposition: A | Discharge status: Home / Self Care | Payer: 59 | Attending: Emergency Medicine | Admitting: Emergency Medicine

## 2012-07-20 ENCOUNTER — Encounter (HOSPITAL_COMMUNITY): Payer: Self-pay | Admitting: *Deleted

## 2012-07-20 DIAGNOSIS — I2510 Atherosclerotic heart disease of native coronary artery without angina pectoris: Secondary | ICD-10-CM | POA: Insufficient documentation

## 2012-07-20 DIAGNOSIS — E785 Hyperlipidemia, unspecified: Secondary | ICD-10-CM | POA: Insufficient documentation

## 2012-07-20 DIAGNOSIS — I10 Essential (primary) hypertension: Secondary | ICD-10-CM | POA: Insufficient documentation

## 2012-07-20 DIAGNOSIS — Z7982 Long term (current) use of aspirin: Secondary | ICD-10-CM | POA: Insufficient documentation

## 2012-07-20 DIAGNOSIS — R109 Unspecified abdominal pain: Secondary | ICD-10-CM | POA: Insufficient documentation

## 2012-07-20 DIAGNOSIS — Z79899 Other long term (current) drug therapy: Secondary | ICD-10-CM | POA: Insufficient documentation

## 2012-07-20 LAB — URINALYSIS, ROUTINE W REFLEX MICROSCOPIC
Bilirubin Urine: NEGATIVE
Glucose, UA: NEGATIVE mg/dL
Ketones, ur: NEGATIVE mg/dL
Leukocytes, UA: NEGATIVE
Nitrite: NEGATIVE
Protein, ur: NEGATIVE mg/dL

## 2012-07-20 MED ORDER — LORAZEPAM 2 MG/ML IJ SOLN
1.0000 mg | Freq: Once | INTRAMUSCULAR | Status: AC
  Administered 2012-07-20: 1 mg via INTRAVENOUS
  Filled 2012-07-20: qty 1

## 2012-07-20 MED ORDER — OXYCODONE-ACETAMINOPHEN 5-325 MG PO TABS
1.0000 | ORAL_TABLET | Freq: Once | ORAL | Status: AC
  Administered 2012-07-20: 1 via ORAL
  Filled 2012-07-20: qty 1

## 2012-07-20 MED ORDER — HYDROMORPHONE HCL PF 1 MG/ML IJ SOLN
1.0000 mg | Freq: Once | INTRAMUSCULAR | Status: AC
  Administered 2012-07-20: 1 mg via INTRAVENOUS
  Filled 2012-07-20: qty 1

## 2012-07-20 MED ORDER — KETOROLAC TROMETHAMINE 30 MG/ML IJ SOLN
15.0000 mg | Freq: Once | INTRAMUSCULAR | Status: AC
  Administered 2012-07-20: 15 mg via INTRAVENOUS
  Filled 2012-07-20: qty 1

## 2012-07-20 MED ORDER — LORAZEPAM 2 MG/ML IJ SOLN: 1.0000 mg | Freq: Once | INTRAMUSCULAR | Status: DC

## 2012-07-20 MED ORDER — PREDNISONE 20 MG PO TABS
60.0000 mg | ORAL_TABLET | Freq: Once | ORAL | Status: AC
  Administered 2012-07-20: 60 mg via ORAL
  Filled 2012-07-20: qty 3

## 2012-07-20 MED ORDER — PREDNISONE 20 MG PO TABS: ORAL_TABLET | ORAL | Status: AC

## 2012-07-20 MED ORDER — HYDROMORPHONE HCL 2 MG PO TABS: 2.0000 mg | ORAL_TABLET | ORAL | Status: AC | PRN

## 2012-07-20 MED ORDER — ONDANSETRON HCL 4 MG/2ML IJ SOLN
4.0000 mg | Freq: Once | INTRAMUSCULAR | Status: AC
  Administered 2012-07-20: 4 mg via INTRAVENOUS
  Filled 2012-07-20: qty 2

## 2012-07-20 NOTE — ED Notes (Signed)
Pt pain now 0/10 following Dilaudid admin. He is relaxed and comfortable. He states he has a "pulling or tugging sensation in the R side now, but it is not painful." ED MD notified. Advised to hold the Ativan for now. Pt aware that a urine specimen is needed. Urinal at bedside. Pt states he will call when he has voided.

## 2012-07-20 NOTE — Progress Notes (Signed)
Pt and wife wish to speak with ED MD prior to d/c. ED MD notified and will come to see them.

## 2012-07-20 NOTE — ED Provider Notes (Addendum)
History     CSN: [id number]  Arrival date & time 07/20/12  1100   First MD Initiated Contact with Patient 07/20/12 1113      Chief Complaint  Patient presents with  . Flank Pain    right side worse    (Consider location/radiation/quality/duration/timing/severity/associated sxs/prior treatment) Patient is a 51 y.o. male presenting with flank pain. The history is provided by the patient.  Flank Pain Pertinent negatives include no chest pain, no abdominal pain, no headaches and no shortness of breath.  pt c/o right flank pain posterior/laterally, constant dull, present for past 2-3 days. Worse w certain movements, change o position. Denies injury or back strain. Pain does not radiate. No leg pain. No scrotal or testicular pain. No hematuria or dysuria. No hx kidney stones. No hx ddd or chronic back pain. No leg numbness/weakness. No nvd. Normal appetite. Having normal stools. No fever or chills. Was in ed for same in past 1-2 days with ct scan done to further evaluate.      Past Medical History  Diagnosis Date  . Coronary artery disease   . Hypertension   . Renal artery stenosis   . Hyperlipidemia     Past Surgical History  Procedure Date  . Coronary stent placement     Family History  Problem Relation Age of Onset  . Heart attack Father   . Hypertension Father   . Heart attack Sister   . Hypertension Sister   . Heart attack Brother   . Hypertension Brother   . Diabetes Neg Hx     History  Substance Use Topics  . Smoking status: Former Games developer  . Smokeless tobacco: Not on file  . Alcohol Use: No      Review of Systems  Constitutional: Negative for fever and chills.  HENT: Negative for neck pain.   Eyes: Negative for redness.  Respiratory: Negative for cough and shortness of breath.   Cardiovascular: Negative for chest pain and leg swelling.  Gastrointestinal: Negative for vomiting, abdominal pain, diarrhea and constipation.  Genitourinary: Positive for  flank pain. Negative for dysuria and hematuria.  Musculoskeletal: Positive for back pain.  Skin: Negative for rash.  Neurological: Negative for weakness, numbness and headaches.  Hematological: Does not bruise/bleed easily.    Allergies  Penicillins  Home Medications   Current Outpatient Rx  Name Route Sig Dispense Refill  . ASPIRIN 325 MG PO TABS Oral Take 325 mg by mouth daily.    Marland Kitchen CITALOPRAM HYDROBROMIDE 20 MG PO TABS Oral Take 20 mg by mouth daily.     Marland Kitchen CLOPIDOGREL BISULFATE 75 MG PO TABS Oral Take 75 mg by mouth daily.     Marland Kitchen LORAZEPAM 1 MG PO TABS Oral Take 1 tablet (1 mg total) by mouth every 6 (six) hours as needed. 10 tablet 0  . LOSARTAN POTASSIUM 25 MG PO TABS Oral Take 25 mg by mouth daily.     Marland Kitchen NAPROXEN 500 MG PO TABS Oral Take 500 mg by mouth 2 (two) times daily with a meal.    . OXYCODONE-ACETAMINOPHEN 5-325 MG PO TABS Oral Take 1 tablet by mouth every 4 (four) hours as needed. For pain    . PRAVASTATIN SODIUM 20 MG PO TABS Oral Take 20 mg by mouth every evening.     Marland Kitchen PROMETHAZINE HCL 25 MG PO TABS Oral Take 1 tablet (25 mg total) by mouth every 6 (six) hours as needed for nausea. 12 tablet 0    BP 178/88  Pulse 84  Temp 98.2 F (36.8 C) (Oral)  Resp 20  SpO2 95%  Physical Exam  Nursing note and vitals reviewed. Constitutional: He is oriented to person, place, and time. He appears well-developed and well-nourished. No distress.  HENT:  Head: Atraumatic.  Eyes: Conjunctivae are normal. No scleral icterus.  Neck: Neck supple. No tracheal deviation present.  Cardiovascular: Normal rate, regular rhythm, normal heart sounds and intact distal pulses.   Pulmonary/Chest: Effort normal and breath sounds normal. No accessory muscle usage. No respiratory distress.  Abdominal: Soft. Bowel sounds are normal. He exhibits no distension and no mass. There is no tenderness. There is no rebound and no guarding.       No hernia.   Genitourinary:       No cva tenderness.  Normal external genitalia. No scrotal/testicular pain, swelling, or tenderness.   Musculoskeletal: Normal range of motion. He exhibits no edema and no tenderness.       Spine nt, aligned, no step off. Right lumbar muscular tenderness.   Neurological: He is alert and oriented to person, place, and time.       Straight leg raise neg. Motor intact bil.   Skin: Skin is warm and dry. No rash noted.       No rash/lesions/vesicles in area of flank pain.   Psychiatric:       Anxious.     ED Course  Procedures (including critical care time)   Labs Reviewed  URINALYSIS, ROUTINE W REFLEX MICROSCOPIC   Results for orders placed during the hospital encounter of 07/20/12  URINALYSIS, ROUTINE W REFLEX MICROSCOPIC      Component Value Range   Color, Urine YELLOW  YELLOW   APPearance CLOUDY (*) CLEAR   Specific Gravity, Urine 1.025  1.005 - 1.030   pH 6.0  5.0 - 8.0   Glucose, UA NEGATIVE  NEGATIVE mg/dL   Hgb urine dipstick NEGATIVE  NEGATIVE   Bilirubin Urine NEGATIVE  NEGATIVE   Ketones, ur NEGATIVE  NEGATIVE mg/dL   Protein, ur NEGATIVE  NEGATIVE mg/dL   Urobilinogen, UA 0.2  0.0 - 1.0 mg/dL   Nitrite NEGATIVE  NEGATIVE   Leukocytes, UA NEGATIVE  NEGATIVE   Ct Abdomen Pelvis Wo Contrast  07/17/2012  *RADIOLOGY REPORT*  Clinical Data: Right flank pain  CT ABDOMEN AND PELVIS WITHOUT CONTRAST  Technique:  Multidetector CT imaging of the abdomen and pelvis was performed following the standard protocol without intravenous contrast.  Comparison: None.  Findings: Lung bases are unremarkable.  Sagittal images of the spine shows disc space flattening at T12 L1 T11-T12 L3-L4 and L4-L5 level.  Unenhanced liver is unremarkable.  No calcified gallstones are noted within gallbladder.  Unenhanced pancreas, spleen and adrenal glands are unremarkable.  Unenhanced kidneys are symmetrical in size.  No nephrolithiasis.  No calcified ureteral calculi are noted bilaterally.  No hydronephrosis or hydroureter.   Multiple colonic diverticula are noted without evidence of acute diverticulitis.  No pericecal inflammation.  Normal appendix is clearly visualized.  No aortic aneurysm.  No small bowel obstruction.  No ascites or free air.  No adenopathy.  Prostate gland and seminal vesicles are unremarkable.  The urinary bladder is unremarkable.  Bilateral distal ureter is unremarkable. Tiny prostate gland calcification measures 3 mm.  IMPRESSION:  1.  No nephrolithiasis.  No hydronephrosis or hydroureter. 2.  No calcified ureteral calculi are noted. 3.  Colonic diverticula are noted without evidence of acute diverticulitis. 4.  Normal appendix is clearly visualized. 5.  Degenerative changes thoracolumbar spine.  Original Report Authenticated By: Natasha Mead, M.D.       MDM  Iv ns. Dilaudid 1 mg iv. zofran iv.   Reviewed nursing notes and prior charts for additional history.   Pt w recent ct for same symptoms neg for acute process.   Pt notes symptoms worse w movement, position change, ?musculoskeletal in etiology.   Recent ct neg for acute process ?possible ddd. abd soft nt. Pt calm, comfortable on recheck.   At d/c, pt/spouse w frustration ie repeat ed visits, no conclusive dx.   Reviewed prior labs, bmet normal, on current and prior ed visits, no fevers.  On repeat abd exam abd soft nt. No focal/point midline spine tenderness. No skin changes/shingles in area of pain. reassurred pt that recent ct, ua x 3, etc uremarkable, x for ?narrowed/flattened disc spaces. Pt requests steroid rx in ed. pred po. States percocet at home didn't control pain, will give small quantity rx dilaudid.  pcp is Dr Foy [place], discussed follow up there Monday.        Suzi Roots, MD 07/20/12 1312  Suzi Roots, MD 07/20/12 [phone]

## 2012-07-20 NOTE — ED Notes (Signed)
Pt states that his pain worsening for the last 24hrs and describes it as severe. Pt is unable to perform daily activities and is mostly writhing in the bed or floor. He has never been in this type of pain before. Denies any N/V/D. No changes in urine at all. No injuries, no strains.

## 2012-07-20 NOTE — ED Notes (Addendum)
Pt states he began having right flank pain Wednesday past.  Pt has been seen at [facility] for same Wednesday and yesterday.  A CT was done on Wednesday with no significant findings.  Pt denies hematuria, dysuria and N/V.  Pt states primary complaint is severe pain.  Pt states pain is worse with movement and palpation.  Pt is restless and expressing pain in triage.

## 2012-08-19 ENCOUNTER — Encounter (HOSPITAL_BASED_OUTPATIENT_CLINIC_OR_DEPARTMENT_OTHER): Payer: Self-pay

## 2012-08-19 ENCOUNTER — Emergency Department (HOSPITAL_BASED_OUTPATIENT_CLINIC_OR_DEPARTMENT_OTHER): Payer: 59

## 2012-08-19 ENCOUNTER — Emergency Department (HOSPITAL_BASED_OUTPATIENT_CLINIC_OR_DEPARTMENT_OTHER)
Admission: EM | Admit: 2012-08-19 | Discharge: 2012-08-19 | Disposition: A | Discharge status: Home / Self Care | Payer: 59 | Attending: Emergency Medicine | Admitting: Emergency Medicine

## 2012-08-19 DIAGNOSIS — M62838 Other muscle spasm: Secondary | ICD-10-CM | POA: Insufficient documentation

## 2012-08-19 DIAGNOSIS — I2510 Atherosclerotic heart disease of native coronary artery without angina pectoris: Secondary | ICD-10-CM | POA: Insufficient documentation

## 2012-08-19 DIAGNOSIS — M5137 Other intervertebral disc degeneration, lumbosacral region: Secondary | ICD-10-CM | POA: Insufficient documentation

## 2012-08-19 DIAGNOSIS — M51379 Other intervertebral disc degeneration, lumbosacral region without mention of lumbar back pain or lower extremity pain: Secondary | ICD-10-CM | POA: Insufficient documentation

## 2012-08-19 DIAGNOSIS — I10 Essential (primary) hypertension: Secondary | ICD-10-CM | POA: Insufficient documentation

## 2012-08-19 DIAGNOSIS — R109 Unspecified abdominal pain: Secondary | ICD-10-CM | POA: Insufficient documentation

## 2012-08-19 LAB — BASIC METABOLIC PANEL
BUN: 13 mg/dL (ref 6–23)
Calcium: 9.9 mg/dL (ref 8.4–10.5)
Creatinine, Ser: 1.2 mg/dL (ref 0.50–1.35)
GFR calc Af Amer: 78 mL/min — ABNORMAL LOW (ref >90)
GFR calc non Af Amer: 67 mL/min — ABNORMAL LOW (ref >90)
Glucose, Bld: 140 mg/dL — ABNORMAL HIGH (ref 70–99)
Potassium: 4.2 mEq/L (ref 3.5–5.1)

## 2012-08-19 LAB — URINALYSIS, ROUTINE W REFLEX MICROSCOPIC
Bilirubin Urine: NEGATIVE
Hgb urine dipstick: NEGATIVE
Ketones, ur: NEGATIVE mg/dL
Nitrite: NEGATIVE
Protein, ur: NEGATIVE mg/dL
Urobilinogen, UA: 0.2 mg/dL (ref 0.0–1.0)

## 2012-08-19 LAB — CBC WITH DIFFERENTIAL/PLATELET
Basophils Absolute: 0 10*3/uL (ref 0.0–0.1)
Eosinophils Absolute: 0.3 10*3/uL (ref 0.0–0.7)
HCT: 41.8 % (ref 39.0–52.0)
Lymphocytes Relative: 20 % (ref 12–46)
MCH: 28.9 pg (ref 26.0–34.0)
MCHC: 35.2 g/dL (ref 30.0–36.0)
Monocytes Absolute: 0.6 10*3/uL (ref 0.1–1.0)
Neutro Abs: 4.9 10*3/uL (ref 1.7–7.7)
RDW: 12.9 % (ref 11.5–15.5)

## 2012-08-19 LAB — POCT I-STAT, CHEM 8
Calcium, Ion: 1.23 mmol/L (ref 1.12–1.23)
Creatinine, Ser: 1.2 mg/dL (ref 0.50–1.35)
Glucose, Bld: 138 mg/dL — ABNORMAL HIGH (ref 70–99)
HCT: 46 % (ref 39.0–52.0)
Hemoglobin: 15.6 g/dL (ref 13.0–17.0)

## 2012-08-19 MED ORDER — HYDROMORPHONE HCL PF 1 MG/ML IJ SOLN
1.0000 mg | Freq: Once | INTRAMUSCULAR | Status: AC
  Administered 2012-08-19: 1 mg via INTRAVENOUS
  Filled 2012-08-19: qty 1

## 2012-08-19 MED ORDER — IOHEXOL 300 MG/ML  SOLN
100.0000 mL | Freq: Once | INTRAMUSCULAR | Status: AC | PRN
  Administered 2012-08-19: 100 mL via INTRAVENOUS

## 2012-08-19 MED ORDER — IBUPROFEN 600 MG PO TABS: 600.0000 mg | ORAL_TABLET | Freq: Four times a day (QID) | ORAL | Status: AC | PRN

## 2012-08-19 MED ORDER — HYDROMORPHONE HCL PF 1 MG/ML IJ SOLN
1.0000 mg | Freq: Once | INTRAMUSCULAR | Status: AC
  Administered 2012-08-19: 1 mg via INTRAVENOUS
  Filled 2012-08-19 (×2): qty 1

## 2012-08-19 MED ORDER — METHOCARBAMOL 100 MG/ML IJ SOLN
1000.0000 mg | Freq: Once | INTRAMUSCULAR | Status: AC
  Administered 2012-08-19: 1000 mg via INTRAVENOUS
  Filled 2012-08-19: qty 10

## 2012-08-19 MED ORDER — METHOCARBAMOL 750 MG PO TABS: 750.0000 mg | ORAL_TABLET | Freq: Two times a day (BID) | ORAL | Status: AC

## 2012-08-19 MED ORDER — ONDANSETRON HCL 4 MG/2ML IJ SOLN
4.0000 mg | Freq: Once | INTRAMUSCULAR | Status: AC
  Administered 2012-08-19: 4 mg via INTRAVENOUS
  Filled 2012-08-19: qty 2

## 2012-08-19 NOTE — ED Notes (Signed)
Patient reports that he developed severe right flank pain yesterday. Denies nausea and vomiting. Reports that he has had same in past and no diagnosis made. Patient reports that he was seen in [place] this weekend for same and taking valium w/o relief

## 2012-08-19 NOTE — ED Notes (Signed)
Patient demanded to see the new EDP to discuss being admitted for his pain.  Discussed with Dr. Rosalia Hammers, will offer patient the pain medications which was ordered earlier and patient refused.  After receiving dilaudid 1mg  iv patient stated that his pain was improved to a 3 of 10.  Patient was offered a paper shirt or a gown to go home in, and he refused.  Patient was given info on hot water bottle to use for comfort and pain relief.  Once patient was ready for discharge, pt repeatedly refused a wheelchair or a stedy to assist to the car.  While in the lobby, the patient got down on his knees at the registration desk and refused to sit.  Patient continued to hold his right side and moan in pain, repeatedly stating that the dilaudid he received had decreased his pain and that he "could feel the pain medication".  Patient walked across the lobby, refusing any assistance, and laid down on the floor with his head resting on the couch.  Encouraged pt to get off of the floor due to the germs on the hospital floor.  Patient got up and stood up straight and walked to the car.

## 2012-08-19 NOTE — ED Notes (Signed)
Returned from CT, son remains at bedside. Resting at present.

## 2012-08-19 NOTE — ED Notes (Signed)
Family at bedside. 

## 2012-08-19 NOTE — ED Notes (Signed)
Additional 1mg  of dilaudid ordered and patient reports that he is comfortable at this point and does not currently need.

## 2012-08-19 NOTE — ED Provider Notes (Signed)
History     CSN: [id number]  Arrival date & time 08/19/12  0554   First MD Initiated Contact with Patient 08/19/12 0559      Chief Complaint  Patient presents with  . Flank Pain    (Consider location/radiation/quality/duration/timing/severity/associated sxs/prior treatment) Patient is a 51 y.o. male presenting with flank pain and abdominal pain. The history is provided by the patient. No language interpreter was used.  Flank Pain This is a recurrent problem. The current episode started yesterday. The problem occurs constantly. Progression since onset: resolved and saw a physician in [place] for same and then it recurred.  Has been seen for same in ED.  Was given valium and stated that the MD was reluctant for some reason to give him narcotic pain medication.   Associated symptoms include abdominal pain. Pertinent negatives include no chest pain, no headaches and no shortness of breath. Nothing aggravates the symptoms. Nothing relieves the symptoms. He has tried nothing for the symptoms. The treatment provided no relief.  Abdominal Pain The primary symptoms of the illness include abdominal pain. The primary symptoms of the illness do not include shortness of breath, nausea, vomiting or diarrhea. The current episode started less than 1 hour ago. The onset of the illness was sudden. The problem has not changed since onset. The abdominal pain began less than 1 hour ago. The pain came on suddenly. The abdominal pain has been unchanged since its onset. Pain Location: right inguinal region. The abdominal pain does not radiate. The severity of the abdominal pain is 10/10. The abdominal pain is relieved by nothing. Exacerbated by: nothing.  The patient states that she believes she is currently not pregnant. The patient has not had a change in bowel habit. Risk factors: none. Symptoms associated with the illness do not include chills. Significant associated medical issues do not include diabetes.    Past  Medical History  Diagnosis Date  . Coronary artery disease   . Hypertension   . Renal artery stenosis   . Hyperlipidemia     Past Surgical History  Procedure Date  . Coronary stent placement     Family History  Problem Relation Age of Onset  . Heart attack Father   . Hypertension Father   . Heart attack Sister   . Hypertension Sister   . Heart attack Brother   . Hypertension Brother   . Diabetes Neg Hx     History  Substance Use Topics  . Smoking status: Former Games developer  . Smokeless tobacco: Not on file  . Alcohol Use: No      Review of Systems  Constitutional: Negative for chills.  Respiratory: Negative for shortness of breath.   Cardiovascular: Negative for chest pain.  Gastrointestinal: Positive for abdominal pain. Negative for nausea, vomiting and diarrhea.  Genitourinary: Positive for flank pain.  Neurological: Negative for headaches.  All other systems reviewed and are negative.    Allergies  Penicillins  Home Medications   Current Outpatient Rx  Name Route Sig Dispense Refill  . DIAZEPAM 5 MG PO TABS Oral Take 5 mg by mouth every 6 (six) hours as needed.    . ASPIRIN 325 MG PO TABS Oral Take 325 mg by mouth daily.    Marland Kitchen CITALOPRAM HYDROBROMIDE 20 MG PO TABS Oral Take 20 mg by mouth daily.     Marland Kitchen CLOPIDOGREL BISULFATE 75 MG PO TABS Oral Take 75 mg by mouth daily.     Marland Kitchen LOSARTAN POTASSIUM 25 MG PO TABS Oral Take  25 mg by mouth daily.     Marland Kitchen NAPROXEN 500 MG PO TABS Oral Take 500 mg by mouth 2 (two) times daily with a meal.    . PRAVASTATIN SODIUM 20 MG PO TABS Oral Take 20 mg by mouth every evening.     Marland Kitchen PROMETHAZINE HCL 25 MG PO TABS Oral Take 1 tablet (25 mg total) by mouth every 6 (six) hours as needed for nausea. 12 tablet 0    SpO2 98%  Physical Exam  Constitutional: He appears well-developed and well-nourished.  HENT:  Head: Normocephalic and atraumatic.  Mouth/Throat: Oropharynx is clear and moist.  Eyes: Conjunctivae are normal. Pupils are  equal, round, and reactive to light.  Neck: Normal range of motion. Neck supple.  Cardiovascular: Normal rate and regular rhythm.   Pulmonary/Chest: Effort normal and breath sounds normal. He has no wheezes. He has no rales.  Abdominal: Soft. Bowel sounds are normal. He exhibits no distension. There is no rebound and no guarding.  Musculoskeletal: Normal range of motion.  Skin:          Gait intact    ED Course  Procedures (including critical care time)  Labs Reviewed  POCT I-STAT, CHEM 8 - Abnormal; Notable for the following:    Glucose, Bld 138 (*)     All other components within normal limits  CBC WITH DIFFERENTIAL  BASIC METABOLIC PANEL  URINALYSIS, ROUTINE W REFLEX MICROSCOPIC   No results found.   No diagnosis found.    MDM  Case d/w Dr. Wynelle Link of [facility].  Concern regarding frequent ED visits.  [facility] to call with close follow up.  There is no pathology on CT scans to explain pain.  Will treat with anti inflamatories and robaxin as patient indicates he already has valium        Eric Bezdek K Judah Carchi-Rasch, MD 08/19/12 [phone]

## 2012-08-19 NOTE — ED Notes (Signed)
Patient transported to CT 

## 2012-11-05 ENCOUNTER — Ambulatory Visit: Payer: 59 | Admitting: Family Medicine

## 2012-11-06 ENCOUNTER — Encounter: Payer: Self-pay | Admitting: Family Medicine

## 2012-11-06 ENCOUNTER — Ambulatory Visit (INDEPENDENT_AMBULATORY_CARE_PROVIDER_SITE_OTHER): Payer: 59 | Admitting: Family Medicine

## 2012-11-06 VITALS — BP 174/112 | HR 62 | Temp 98.0°F | Ht 67.0 in | Wt 220.0 lb

## 2012-11-06 DIAGNOSIS — E782 Mixed hyperlipidemia: Secondary | ICD-10-CM

## 2012-11-06 DIAGNOSIS — I10 Essential (primary) hypertension: Secondary | ICD-10-CM

## 2012-11-06 LAB — COMPREHENSIVE METABOLIC PANEL
ALT: 24 U/L (ref 0–53)
AST: 21 U/L (ref 0–37)
Albumin: 4.5 g/dL (ref 3.5–5.2)
Calcium: 9.8 mg/dL (ref 8.4–10.5)
Chloride: 104 mEq/L (ref 96–112)
Potassium: 4.1 mEq/L (ref 3.5–5.1)
Sodium: 139 mEq/L (ref 135–145)
Total Protein: 7.7 g/dL (ref 6.0–8.3)

## 2012-11-06 LAB — LIPID PANEL: Total CHOL/HDL Ratio: 5

## 2012-11-06 MED ORDER — LOSARTAN POTASSIUM 100 MG PO TABS: 100.0000 mg | ORAL_TABLET | Freq: Every day | ORAL | Status: DC

## 2012-11-06 NOTE — Assessment & Plan Note (Signed)
Poor control but is off of his meds x 2 wks now. Will restart 100mg  cozaar, which he says controlled things nicely in the past. He'll monitor bp at home. He'll arrange routine cardiology f/u soon with Dr. Allyson Sabal (?continue plavix?). Continue ASA and plavix and statin at this time. Check CMET and FLP today.

## 2012-11-06 NOTE — Progress Notes (Signed)
Office Note 11/06/2012  CC:  Chief Complaint  Patient presents with  . Establish Care    elevated BP    HPI:  Eric Hunter is a 51 y.o. White male who is here to establish care and discuss HTN--poor control. Patient's most recent primary MD: Brooks Sailors at [facility].  Says he had an issue over money with [facility]. He feels like he was treated poorly with regard to this. Old records in EPIC/HL were reviewed prior to or during today's visit.  Out of bp med lately--about 15d.  Says "i can feel it, too". When on cozaar says bp 130s/80s at random checks at work or at [employer].  Labs last done in the last 7mo per pt, says there was some concern over his glucose and pt proceeded to lose wt.  Also, this glucose was done around the time when he was on some systemic steroids for his chest wall injury/pain.   Past Medical History  Diagnosis Date  . Coronary artery disease   . Hypertension   . Renal artery stenosis   . Hyperlipidemia   . Diverticular disease     h/o 'itis  . Right flank pain     apparently had a trigger point b/c once the orthopedist injected the region he had no further pain.  (extensive w/u done for this problem prior to the injection, though).    Past Surgical History  Procedure Date  . Coronary stent placement 09/14/09    RCA  . Palate surgery age 42 mo  . Knee surgery 2007    Rt knee arthroscopy  . Knee surgery 1983    Bilat knee debridement  . Tympanostomy tube placement 1967    Family History  Problem Relation Age of Onset  . Heart attack Father   . Hypertension Father   . Heart attack Sister   . Hypertension Sister   . Heart attack Brother   . Hypertension Brother   . Diabetes Neg Hx     History   Social History  . Marital Status: Married    Spouse Name: N/A    Number of Children: N/A  . Years of Education: N/A   Occupational History  . Not on file.   Social History Main Topics  . Smoking status: Former Games developer  . Smokeless  tobacco: Never Used  . Alcohol Use: No  . Drug Use: Yes    Special: Marijuana  . Sexually Active: Not on file   Other Topics Concern  . 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  Rare use of marijuana (recreational).  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    Outpatient Encounter Prescriptions as of 11/06/2012  Medication Sig Dispense Refill  . aspirin 325 MG tablet Take 325 mg by mouth daily.      . clopidogrel (PLAVIX) 75 MG tablet Take 75 mg by mouth daily.       Marland Kitchen ibuprofen (ADVIL,MOTRIN) 200 MG tablet Take 200 mg by mouth every 6 (six) hours as needed.      Marland Kitchen losartan (COZAAR) 100 MG tablet Take 1 tablet (100 mg total) by mouth daily.  30 tablet  3  . nitroGLYCERIN (NITROSTAT) 0.4 MG  SL tablet Place 0.4 mg under the tongue every 5 (five) minutes as needed.      . simvastatin (ZOCOR) 40 MG tablet Take 40 mg by mouth every evening.      . [DISCONTINUED] losartan (COZAAR) 100 MG tablet Take 100 mg by mouth daily.      . [DISCONTINUED] citalopram (CELEXA) 20 MG tablet Take 20 mg by mouth daily.       . [DISCONTINUED] diazepam (VALIUM) 5 MG tablet Take 5 mg by mouth every 6 (six) hours as needed.      . [DISCONTINUED] losartan (COZAAR) 25 MG tablet Take 25 mg by mouth daily.       . [DISCONTINUED] naproxen (NAPROSYN) 500 MG tablet Take 500 mg by mouth 2 (two) times daily with a meal.      . [DISCONTINUED] pravastatin (PRAVACHOL) 20 MG tablet Take 20 mg by mouth every evening.       . [DISCONTINUED] promethazine (PHENERGAN) 25 MG tablet Take 1 tablet (25 mg total) by mouth every 6 (six) hours as needed for nausea.  12 tablet  0    Allergies  Allergen Reactions  .  Penicillins Rash and Other (See Comments)    Angio-edema    ROS: foggy, a bit irritable feeling.  No HA, no dizziness, no CP or SOB. PE; Blood pressure 174/112, pulse 62, temperature 98 F (36.7 C), temperature source Temporal, height 5\' 7"  (1.702 m), weight 220 lb (99.791 kg), SpO2 94.00%. Gen: Alert, well appearing.  Patient is oriented to person, place, time, and situation. AFFECT: pleasant, lucid thought and speech. Neck: supple/nontender.  No LAD, mass, or TM.  Carotid pulses 2+ bilaterally, without bruits. CV: RRR, no m/r/g.   LUNGS: CTA bilat, nonlabored resps, good aeration in all lung fields. ABD: soft, NT, ND, BS normal.  No hepatospenomegaly or mass.  No bruits. EXT: no clubbing, cyanosis, or edema.   Pertinent labs:  None today  ASSESSMENT AND PLAN:   New pt: obtain old records.  HTN (hypertension), benign Poor control but is off of his meds x 2 wks now. Will restart 100mg  cozaar, which he says controlled things nicely in the past. He'll monitor bp at home. He'll arrange routine cardiology f/u soon with Dr. Allyson Sabal (?continue plavix?). Continue ASA and plavix and statin at this time. Check CMET and FLP today.   An After Visit Summary was printed and given to the patient.  Return in about 4 months (around 03/06/2013) for f/u HTN.

## 2012-11-06 NOTE — Patient Instructions (Signed)
Monitor bp at home: if consistently >140/90 then call or return prior to your scheduled f/u appt.

## 2012-11-07 ENCOUNTER — Encounter: Payer: Self-pay | Admitting: Family Medicine

## 2012-11-08 ENCOUNTER — Other Ambulatory Visit: Payer: Self-pay | Admitting: Family Medicine

## 2012-11-08 MED ORDER — ATORVASTATIN CALCIUM 20 MG PO TABS: 20.0000 mg | ORAL_TABLET | Freq: Every day | ORAL | Status: DC

## 2013-01-08 ENCOUNTER — Encounter: Payer: Self-pay | Admitting: Family Medicine

## 2013-03-06 ENCOUNTER — Ambulatory Visit: Payer: 59 | Admitting: Family Medicine

## 2013-03-06 DIAGNOSIS — Z0289 Encounter for other administrative examinations: Secondary | ICD-10-CM

## 2013-04-11 ENCOUNTER — Ambulatory Visit (INDEPENDENT_AMBULATORY_CARE_PROVIDER_SITE_OTHER): Payer: 59 | Admitting: Family Medicine

## 2013-04-11 ENCOUNTER — Other Ambulatory Visit: Payer: Self-pay | Admitting: Family Medicine

## 2013-04-11 ENCOUNTER — Encounter: Payer: Self-pay | Admitting: Family Medicine

## 2013-04-11 VITALS — BP 176/117 | HR 82 | Temp 98.3°F | Resp 16 | Wt 218.2 lb

## 2013-04-11 DIAGNOSIS — M7918 Myalgia, other site: Secondary | ICD-10-CM

## 2013-04-11 DIAGNOSIS — IMO0001 Reserved for inherently not codable concepts without codable children: Secondary | ICD-10-CM

## 2013-04-11 MED ORDER — NITROGLYCERIN 0.4 MG SL SUBL: 0.4000 mg | SUBLINGUAL_TABLET | SUBLINGUAL | Status: AC | PRN

## 2013-04-11 MED ORDER — METOPROLOL SUCCINATE ER 25 MG PO TB24: 25.0000 mg | ORAL_TABLET | Freq: Every day | ORAL | Status: DC

## 2013-04-11 MED ORDER — OXYCODONE HCL 5 MG PO TABS: 5.0000 mg | ORAL_TABLET | Freq: Four times a day (QID) | ORAL | Status: DC | PRN

## 2013-04-11 NOTE — Telephone Encounter (Signed)
Rx request to pharmacy/SLS  

## 2013-04-11 NOTE — Progress Notes (Signed)
OFFICE NOTE  04/11/2013  CC:  Chief Complaint  Patient presents with  . Headache    Pt c/o asymptomatic HTN headache began this morning.     HPI: Patient is a 52 y.o. Caucasian male who is here for headache. Onset this morning during masturbation as he was nearing climax.  Intense at first (6-7/10), and waxing and waning for the last 4-5 hours now at milder level: 4-5/10 intensity.  No nausea.  No signif dizziness.  The HA started occipitally and then covered his entire head.  This is the first time he has had this type of HA. BP: has not been monitoring.  [place] and was 140/80.  Right subscap muscle soreness/tightness over the last year, worse under stress. Stressed more lately, hasn't worked in a while--no financial probs but he feels a lack of fulfillment in life.   Pertinent PMH:  Past Medical History  Diagnosis Date  . Coronary artery disease     DES to RCA 2010 in setting of acute event; repeat cath 03/02/10 for chest pain showed widely patent stent and normal LV function.  . Hypertension   . Renal artery stenosis 2011    Discordant data (mild bilat ostial disease on distal aortogram at time of cath, renal arteries appeared normal.  Then renal doppler u/s showed R>L RAS, renal sizes similar.  Watchful waiting approach agreed upon, with plan for duplex u/s of  renal arteries on semiannual basis.  . Hyperlipidemia   . Diverticular disease     h/o 'itis  . Right flank pain     apparently had a trigger point b/c once the orthopedist injected the region he had no further pain.  (extensive w/u done for this problem prior to the injection, though).  . Metabolic syndrome   . History of pancreatitis   . History of peptic ulcer disease   . Mood disorder     per old records; anger and irritability + elevated libido--improved on sertraline.   Past Surgical History  Procedure Laterality Date  . Coronary stent placement  09/14/09    RCA (Dr.  Clarene Duke)  . Palate surgery  age 81 mo  . Knee surgery  2007    Rt knee arthroscopy  . Knee surgery  1983    Bilat knee debridement  . Tympanostomy tube placement  1967   Past family and social history reviewed and there are no changes since the patient's last office visit with me.  MEDS:  Outpatient Prescriptions Prior to Visit  Medication Sig Dispense Refill  . aspirin 325 MG tablet Take 325 mg by mouth daily.      Marland Kitchen atorvastatin (LIPITOR) 20 MG tablet Take 1 tablet (20 mg total) by mouth daily.  30 tablet  2  . losartan (COZAAR) 100 MG tablet Take 1 tablet (100 mg total) by mouth daily.  30 tablet  3  . nitroGLYCERIN (NITROSTAT) 0.4 MG SL tablet Place 0.4 mg under the tongue every 5 (five) minutes as needed.      . simvastatin (ZOCOR) 40 MG tablet Take 40 mg by mouth every evening.      Marland Kitchen ibuprofen (ADVIL,MOTRIN) 200 MG tablet Take 200 mg by mouth every 6 (six) hours as needed.      . clopidogrel (PLAVIX) 75 MG tablet Take 75 mg by mouth daily.        No facility-administered medications prior to visit.  **Not taking Zocor or Plavix as listed above  PE: Blood pressure 176/117, pulse 82, temperature 98.3 F (36.8 C), temperature source Oral, resp. rate 16, weight 218 lb 4 oz (98.998 kg), SpO2 97.00%. Gen: Alert, well appearing.  Patient is oriented to person, place, time, and situation. AFFECT: pleasant, lucid thought and speech. ENT: Ears: EACs clear, normal epithelium.  TMs with good light reflex and landmarks bilaterally.  Eyes: no injection, icteris, swelling, or exudate.  EOMI, PERRLA. Nose: no drainage or turbinate edema/swelling.  No injection or focal lesion.  Mouth: lips without lesion/swelling.  Oral mucosa pink and moist.  Dentition intact and without obvious caries or gingival swelling.  Oropharynx without erythema, exudate, or swelling.  BACK: tenderness to palpation in right mid back/scapula region, but no trigger point can be identified.  No rash or erythema. CV: RRR,  no m/r/g.   LUNGS: CTA bilat, nonlabored resps, good aeration in all lung fields.    IMPRESSION AND PLAN:  1) Climax HA: reassured pt of benign nature of this.  Discussed possible use of triptan but with his bp elevated lately I decided against this class of meds.   Decided to rx oxycodone 5mg , 1-2 tabs q6h prn, #10, no RF.  2) HTN, poor control.  Add Toprol XL 25mg  qd.  Check bp daily for the next 1 wk.    3) Right scapular region myofascial pain: refer to PT.  FOLLOW UP: 1 wk

## 2013-04-17 ENCOUNTER — Telehealth: Payer: Self-pay | Admitting: Family Medicine

## 2013-04-17 ENCOUNTER — Other Ambulatory Visit: Payer: Self-pay | Admitting: Family Medicine

## 2013-04-17 DIAGNOSIS — E785 Hyperlipidemia, unspecified: Secondary | ICD-10-CM

## 2013-04-17 DIAGNOSIS — E8881 Metabolic syndrome: Secondary | ICD-10-CM

## 2013-04-17 NOTE — Telephone Encounter (Signed)
Orders entered (fasting). However, he also needs to get a bp check at that time (nurse visit is ok, but office visit with me would be fine if he prefers).-thx

## 2013-04-23 ENCOUNTER — Other Ambulatory Visit (INDEPENDENT_AMBULATORY_CARE_PROVIDER_SITE_OTHER): Payer: 59

## 2013-04-23 DIAGNOSIS — E8881 Metabolic syndrome: Secondary | ICD-10-CM

## 2013-04-23 DIAGNOSIS — E785 Hyperlipidemia, unspecified: Secondary | ICD-10-CM

## 2013-04-23 LAB — BASIC METABOLIC PANEL
BUN: 16 mg/dL (ref 6–23)
CO2: 27 mEq/L (ref 19–32)
Calcium: 9.7 mg/dL (ref 8.4–10.5)
GFR: 79.07 mL/min (ref >60.00)
Glucose, Bld: 109 mg/dL — ABNORMAL HIGH (ref 70–99)

## 2013-04-23 LAB — LIPID PANEL
Cholesterol: 170 mg/dL (ref 0–200)
HDL: 37.7 mg/dL — ABNORMAL LOW (ref >39.00)
VLDL: 20.2 mg/dL (ref 0.0–40.0)

## 2013-04-23 NOTE — Progress Notes (Signed)
Labs only

## 2013-04-24 ENCOUNTER — Telehealth: Payer: Self-pay | Admitting: *Deleted

## 2013-04-24 MED ORDER — ATORVASTATIN CALCIUM 40 MG PO TABS: 40.0000 mg | ORAL_TABLET | Freq: Every day | ORAL | Status: DC

## 2013-04-24 NOTE — Telephone Encounter (Signed)
Patient notified of results and is agreeable to atorvastatin being increased to 40 mg qd. Sent in to pharmacy.

## 2013-04-24 NOTE — Telephone Encounter (Signed)
Message copied by Marlene Lard on Thu Apr 24, 2013  1:23 PM ------      Message from: Jeoffrey Massed      Created: Thu Apr 24, 2013  1:33 AM       Pls notify pt that his cholesterol numbers were stable, but as per the latest cholesterol guidelines, I recommend he increase his lipitor to 40mg  qd.  If he's ok with this, please send in rx for atorvastatin 40mg , 1 tab po qd, #30, RF x 2.      Recheck cholesterol in 2-3 mo.-thx ------

## 2013-06-23 ENCOUNTER — Ambulatory Visit (INDEPENDENT_AMBULATORY_CARE_PROVIDER_SITE_OTHER): Payer: 59 | Admitting: Family Medicine

## 2013-06-23 ENCOUNTER — Encounter: Payer: Self-pay | Admitting: Family Medicine

## 2013-06-23 VITALS — BP 170/98 | HR 75 | Temp 99.0°F | Resp 18 | Ht 67.0 in | Wt 220.0 lb

## 2013-06-23 DIAGNOSIS — A630 Anogenital (venereal) warts: Secondary | ICD-10-CM

## 2013-06-23 DIAGNOSIS — M549 Dorsalgia, unspecified: Secondary | ICD-10-CM

## 2013-06-23 DIAGNOSIS — M546 Pain in thoracic spine: Secondary | ICD-10-CM

## 2013-06-23 DIAGNOSIS — I701 Atherosclerosis of renal artery: Secondary | ICD-10-CM

## 2013-06-23 DIAGNOSIS — I10 Essential (primary) hypertension: Secondary | ICD-10-CM

## 2013-06-23 HISTORY — DX: Anogenital (venereal) warts: A63.0

## 2013-06-23 MED ORDER — IMIQUIMOD 5 % EX CREA: TOPICAL_CREAM | CUTANEOUS | Status: DC

## 2013-06-23 MED ORDER — METOPROLOL SUCCINATE ER 50 MG PO TB24: 50.0000 mg | ORAL_TABLET | Freq: Every day | ORAL | Status: DC

## 2013-06-23 NOTE — Assessment & Plan Note (Signed)
Uncontrolled.  Home bps better than office. Increase Toprol XL to 50mg  qAM and continue cozaar 100mg  qd. Continue daily home bp monitoring and recheck in office in 2 wks.

## 2013-06-23 NOTE — Progress Notes (Signed)
OFFICE NOTE  06/23/2013  CC:  Chief Complaint  Patient presents with  . Hospitalization Follow-up    [facility]     HPI: Patient is a 52 y.o. Caucasian male who is here for f/u from recent hospitalization at [facility] (06/15/13-06/16/13). Describes onset of right subscapular pain the day of admission, then got a bad HA and went to [facility] for help with his LBP.  His bp was up. He ended up getting admitted for CP r/o MI (he says he never complained of any cardiopulmonary sx's at all)--all was normal.  Eventually got 80mg  solumedrol IM, oral steroids x 5d, and massage therapy and feels 1000 % better. I reviewed his entire hospital chart today in office.  Also doing door stretching.   C/o lesion on penis, getting larger for a few months, warty appearing.  Has never had one before.  Has no new sex partner.  [place] thinks [place].  BP monitoring at home: 140s/80s early in day, 150s/90s later in the day.  Denies HAs since the day he went to the ED on 7/06.  No chest pain, no SOB, no palpitations, no dizziness/presyncope. He is walking 3 times per week.   Pertinent PMH:  Past Medical History  Diagnosis Date  . Coronary artery disease     DES to RCA 2010 in setting of acute event; repeat cath 03/02/10 for chest pain showed widely patent stent and normal LV function.  . Hypertension   . Renal artery stenosis 2011    Discordant data (mild bilat ostial disease on distal aortogram at time of cath, renal arteries appeared normal.  Then renal doppler u/s showed R>L RAS, renal sizes similar.  Watchful waiting approach agreed upon, with plan for duplex u/s of  renal arteries on semiannual basis.  . Hyperlipidemia   . Diverticular disease     h/o 'itis  . Right flank pain     apparently had a trigger point b/c once the orthopedist injected the region he had no further pain.  (extensive w/u done for this problem  prior to the injection, though).  . Metabolic syndrome   . History of pancreatitis   . History of peptic ulcer disease   . Mood disorder     per old records; anger and irritability + elevated libido--improved on sertraline.   Past Surgical History  Procedure Laterality Date  . Coronary stent placement  09/14/09    RCA (Dr. Clarene Duke)  . Palate surgery  age 51 mo  . Knee surgery  2007    Rt knee arthroscopy  . Knee surgery  1983    Bilat knee debridement  . Tympanostomy tube placement  1967   Past family and social history reviewed and there are no changes since the patient's last office visit with me.  MEDS:  Outpatient Prescriptions Prior to Visit  Medication Sig Dispense Refill  . aspirin 325 MG tablet Take 325 mg by mouth daily.      Marland Kitchen atorvastatin (LIPITOR) 40 MG tablet Take 1 tablet (40 mg total) by mouth daily.  30 tablet  2  . losartan (COZAAR) 100 MG tablet TAKE 1 TABLET (100 MG TOTAL) BY MOUTH DAILY.  30 tablet  5  . metoprolol succinate (TOPROL-XL) 25 MG 24 hr tablet Take 1 tablet (25 mg total) by mouth daily.  30 tablet  3  . nitroGLYCERIN (NITROSTAT) 0.4 MG SL tablet Place  1 tablet (0.4 mg total) under the tongue every 5 (five) minutes as needed.  30 tablet  11  . ibuprofen (ADVIL,MOTRIN) 200 MG tablet Take 200 mg by mouth every 6 (six) hours as needed.      Marland Kitchen oxyCODONE (OXY IR/ROXICODONE) 5 MG immediate release tablet Take 1 tablet (5 mg total) by mouth every 6 (six) hours as needed for pain.  10 tablet  0   No facility-administered medications prior to visit.  Not taking oxycodone or ibup or nitrostat  PE: Blood pressure 170/98, pulse 75, temperature 99 F (37.2 C), temperature source Temporal, resp. rate 18, height 5\' 7"  (1.702 m), weight 220 lb (99.791 kg), SpO2 96.00%. Gen: Alert, well appearing.  Patient is oriented to person, place, time, and situation.  Pt visibly upset when talking about his experience at [facility] recently. Also upset when talking about his  problems at home with his wife (no sexual contact in 8 yrs) and lack of a job lately. Neck: carotids 1+ bilat, no bruits. CV: RRR, 1-2/6 systolic ejection murmur at RUSB, S1 and S2 normal.  No rub or gallop. LUNGS: CTA bilat, nonlabored resps. EXT: no clubbing, cyanosis, or edema.  GU: distal shaft of penis with two condylomatous lesions side by side--each about 3 mm in diameter.   IMPRESSION AND PLAN:  HTN (hypertension), benign Uncontrolled.  Home bps better than office. Increase Toprol XL to 50mg  qAM and continue cozaar 100mg  qd. Continue daily home bp monitoring and recheck in office in 2 wks.  Renal artery stenosis Last documentation of this was 2011.  He is overdue for recheck of renal artery doppler u/s. This was done at [facility] and we'll arrange for repeat to be through them as well. Renal function has remained normal, although bp still a problem.  Trigger point of thoracic region Resolved s/p recent systemic steroids (IM and PO) + massage therapy after being seen at [facility] for acute worsening of this pain. He is pain-free today on no pain meds at all.  Continue home stretching.  Genital warts Two warts right next to each other on shaft of penis. Aldara 0.05% cream, apply nightly and wash off after 8 hrs--for 24 treatments.  May repeat (RF given) if wart not gone at that time.   An After Visit Summary was printed and given to the patient.  FOLLOW UP: 2 wks

## 2013-06-23 NOTE — Assessment & Plan Note (Signed)
Resolved s/p recent systemic steroids (IM and PO) + massage therapy after being seen at [facility] for acute worsening of this pain. He is pain-free today on no pain meds at all.  Continue home stretching.

## 2013-06-23 NOTE — Assessment & Plan Note (Signed)
Last documentation of this was 2011.  He is overdue for recheck of renal artery doppler u/s. This was done at [facility] and we'll arrange for repeat to be through them as well. Renal function has remained normal, although bp still a problem.

## 2013-06-23 NOTE — Assessment & Plan Note (Signed)
Two warts right next to each other on shaft of penis. Aldara 0.05% cream, apply nightly and wash off after 8 hrs--for 24 treatments.  May repeat (RF given) if wart not gone at that time.

## 2013-07-03 ENCOUNTER — Ambulatory Visit (HOSPITAL_COMMUNITY)
Admission: RE | Admit: 2013-07-03 | Discharge: 2013-07-03 | Disposition: A | Discharge status: Home / Self Care | Payer: 59 | Source: Ambulatory Visit | Attending: Cardiovascular Disease | Admitting: Cardiovascular Disease

## 2013-07-03 DIAGNOSIS — I10 Essential (primary) hypertension: Secondary | ICD-10-CM

## 2013-07-03 DIAGNOSIS — I150 Renovascular hypertension: Secondary | ICD-10-CM | POA: Insufficient documentation

## 2013-07-03 DIAGNOSIS — I701 Atherosclerosis of renal artery: Secondary | ICD-10-CM

## 2013-07-03 NOTE — Progress Notes (Signed)
Renal Duplex Completed. Lesleyanne Politte, RDMS, RVT  

## 2013-07-04 ENCOUNTER — Encounter: Payer: Self-pay | Admitting: Family Medicine

## 2013-07-07 ENCOUNTER — Ambulatory Visit: Payer: 59 | Admitting: Family Medicine

## 2013-07-09 ENCOUNTER — Encounter: Payer: Self-pay | Admitting: Family Medicine

## 2013-07-09 ENCOUNTER — Ambulatory Visit (INDEPENDENT_AMBULATORY_CARE_PROVIDER_SITE_OTHER): Payer: 59 | Admitting: Family Medicine

## 2013-07-09 VITALS — BP 161/94 | HR 56 | Temp 98.4°F | Resp 18 | Ht 67.0 in | Wt 229.0 lb

## 2013-07-09 DIAGNOSIS — I10 Essential (primary) hypertension: Secondary | ICD-10-CM

## 2013-07-09 DIAGNOSIS — I701 Atherosclerosis of renal artery: Secondary | ICD-10-CM

## 2013-07-09 NOTE — Progress Notes (Signed)
OFFICE NOTE  07/09/2013  CC:  Chief Complaint  Patient presents with  . Follow-up  . Results    U/S     HPI: Patient is a 52 y.o. Caucasian male who is here for 2 wk f/u HTN and review of results of recent repeat renal artery u/s for hx of mild RAS.  Results were unchanged and a repeat in 12 mo was recommended. He likely has essential HTN, no secondary HTN. Monitoring at home shows systolic 140-160 range and diastolic 88-94 range.  No HR checks. Says he feels great since getting on toprol XL 50mg  qd---head is clearer and muscle pains have resolved.   Pertinent PMH:  Past Medical History  Diagnosis Date  . Coronary artery disease     DES to RCA 2010 in setting of acute event; repeat cath 03/02/10 for chest pain showed widely patent stent and normal LV function.  . Hypertension   . Renal artery stenosis 2011    Discordant data (mild bilat ostial disease on distal aortogram at time of cath, renal arteries appeared normal.  Then renal doppler u/s showed R>L RAS, renal sizes similar.  Recheck of renal artery dopplers 06/2013 showed no significant changes--repeat in 12 months recommended.  . Hyperlipidemia   . Diverticular disease     h/o 'itis  . Right flank pain     apparently had a trigger point b/c once the orthopedist injected the region he had no further pain.  (extensive w/u done for this problem prior to the injection, though).  . Metabolic syndrome   . History of pancreatitis   . History of peptic ulcer disease   . Mood disorder     per old records; anger and irritability + elevated libido--improved on sertraline.   Past surgical, social, and family history reviewed and no changes noted since last office visit.  MEDS:  Outpatient Prescriptions Prior to Visit  Medication Sig Dispense Refill  . aspirin 325 MG tablet Take 325 mg by mouth daily.      Marland Kitchen atorvastatin (LIPITOR) 40 MG tablet Take 1 tablet (40 mg total) by mouth daily.  30 tablet  2  . imiquimod (ALDARA) 5 %  cream Apply to affected area qhs --wash off every morning--for 24 days  24 each  1  . losartan (COZAAR) 100 MG tablet TAKE 1 TABLET (100 MG TOTAL) BY MOUTH DAILY.  30 tablet  5  . metoprolol succinate (TOPROL-XL) 50 MG 24 hr tablet Take 1 tablet (50 mg total) by mouth daily. Take with or immediately following a meal.  30 tablet  1  . nitroGLYCERIN (NITROSTAT) 0.4 MG SL tablet Place 1 tablet (0.4 mg total) under the tongue every 5 (five) minutes as needed.  30 tablet  11  . oxyCODONE (OXY IR/ROXICODONE) 5 MG immediate release tablet Take 1 tablet (5 mg total) by mouth every 6 (six) hours as needed for pain.  10 tablet  0  . ibuprofen (ADVIL,MOTRIN) 200 MG tablet Take 200 mg by mouth every 6 (six) hours as needed.       No facility-administered medications prior to visit.  Not taking ibuprofen or oxycodone---will take these off his med list.  PE: Blood pressure 161/94, pulse 56, temperature 98.4 F (36.9 C), temperature source Temporal, resp. rate 18, height 5\' 7"  (1.702 m), weight 229 lb (103.874 kg), SpO2 96.00%. Gen: Alert, well appearing.  Patient is oriented to person, place, time, and situation. AFFECT: pleasant.  Displays lucid thought and speech. CV: RRR, no m/r/g.  (  HR 60/min) LUNGS: CTA bilat, nonlabored resps, good aeration in all lung fields.   IMPRESSION AND PLAN:  HTN (hypertension), benign He is feeling MUCH better. I'll not change his meds today: will give toprol XL longer to take full effect in combo with his cozaar. Continue BP monitoring; reviewed parameters today to call or return for. Bring numbers to next visit for review--in 3 mo.  Renal artery stenosis Mild and unchanged on u/s about a week ago. Repeat renal artery dopplers in 12 mo.   An After Visit Summary was printed and given to the patient.  FOLLOW UP: 3 mo

## 2013-07-09 NOTE — Assessment & Plan Note (Signed)
Mild and unchanged on u/s about a week ago. Repeat renal artery dopplers in 12 mo.

## 2013-07-09 NOTE — Assessment & Plan Note (Signed)
He is feeling MUCH better. I'll not change his meds today: will give toprol XL longer to take full effect in combo with his cozaar. Continue BP monitoring; reviewed parameters today to call or return for. Bring numbers to next visit for review--in 3 mo.

## 2013-08-19 ENCOUNTER — Other Ambulatory Visit: Payer: Self-pay | Admitting: Family Medicine

## 2013-08-19 MED ORDER — METOPROLOL SUCCINATE ER 50 MG PO TB24: 50.0000 mg | ORAL_TABLET | Freq: Every day | ORAL | Status: DC

## 2013-11-27 ENCOUNTER — Encounter: Payer: Self-pay | Admitting: Family Medicine

## 2013-11-27 ENCOUNTER — Ambulatory Visit (INDEPENDENT_AMBULATORY_CARE_PROVIDER_SITE_OTHER): Payer: 59 | Admitting: Family Medicine

## 2013-11-27 VITALS — BP 174/117 | HR 68 | Temp 98.8°F | Resp 20 | Ht 67.0 in | Wt 228.0 lb

## 2013-11-27 DIAGNOSIS — I10 Essential (primary) hypertension: Secondary | ICD-10-CM

## 2013-11-27 DIAGNOSIS — J029 Acute pharyngitis, unspecified: Secondary | ICD-10-CM

## 2013-11-27 DIAGNOSIS — J111 Influenza due to unidentified influenza virus with other respiratory manifestations: Secondary | ICD-10-CM

## 2013-11-27 MED ORDER — OSELTAMIVIR PHOSPHATE 75 MG PO CAPS: 75.0000 mg | ORAL_CAPSULE | Freq: Two times a day (BID) | ORAL | Status: DC

## 2013-11-27 MED ORDER — HYDROCODONE-HOMATROPINE 5-1.5 MG/5ML PO SYRP: ORAL_SOLUTION | ORAL | Status: DC

## 2013-11-27 NOTE — Progress Notes (Signed)
Pre visit review using our clinic review tool, if applicable. No additional management support is needed unless otherwise documented below in the visit note.  OFFICE NOTE  11/27/2013  CC:  Chief Complaint  Patient presents with  . Sore Throat    last night  . Cough     HPI: Patient is a 52 y.o. Caucasian male who is here for cough.  Onset last night, HA/ST, right ear fullness, some cough, sniffles/sneezing, some PND. Feeling tired, diffusely achy.  Subjective fever today, took 1000mg  tylenol. No flu vaccine this season. Has taken all of chronic meds, no OTC cold meds.  No n/v/d.  Pertinent PMH:  Past Medical History  Diagnosis Date  . Coronary artery disease     DES to RCA 2010 in setting of acute event; repeat cath 03/02/10 for chest pain showed widely patent stent and normal LV function.  . Hypertension   . Renal artery stenosis 2011    Discordant data (mild bilat ostial disease on distal aortogram at time of cath, renal arteries appeared normal.  Then renal doppler u/s showed R>L RAS, renal sizes similar.  Recheck of renal artery dopplers 06/2013 showed no significant changes--repeat in 12 months recommended.  . Hyperlipidemia   . Diverticular disease     h/o 'itis  . Right flank pain     apparently had a trigger point b/c once the orthopedist injected the region he had no further pain.  (extensive w/u done for this problem prior to the injection, though).  . Metabolic syndrome   . History of pancreatitis   . History of peptic ulcer disease   . Mood disorder     per old records; anger and irritability + elevated libido--improved on sertraline.   Past surgical, social, and family history reviewed and no changes noted since last office visit.  MEDS:  Outpatient Prescriptions Prior to Visit  Medication Sig Dispense Refill  . aspirin 325 MG tablet Take 325 mg by mouth daily.      Marland Kitchen atorvastatin (LIPITOR) 40 MG tablet Take 1 tablet (40 mg total) by mouth daily.  30 tablet   2  . losartan (COZAAR) 100 MG tablet TAKE 1 TABLET (100 MG TOTAL) BY MOUTH DAILY.  30 tablet  5  . metoprolol succinate (TOPROL-XL) 50 MG 24 hr tablet Take 1 tablet (50 mg total) by mouth daily. Take with or immediately following a meal.  30 tablet  1  . nitroGLYCERIN (NITROSTAT) 0.4 MG SL tablet Place 1 tablet (0.4 mg total) under the tongue every 5 (five) minutes as needed.  30 tablet  11  . imiquimod (ALDARA) 5 % cream Apply to affected area qhs --wash off every morning--for 24 days  24 each  1   No facility-administered medications prior to visit.    PE: Blood pressure 174/117, pulse 68, temperature 98.8 F (37.1 C), temperature source Temporal, resp. rate 20, height 5\' 7"  (1.702 m), weight 228 lb (103.42 kg), SpO2 98.00%. VS: noted--normal. Gen: alert, NAD, NONTOXIC APPEARING. HEENT: eyes without injection, drainage, or swelling.  Ears: EACs clear, TMs with normal light reflex and landmarks.  Nose: Clear rhinorrhea, with some dried, crusty exudate adherent to mildly injected mucosa.  No purulent d/c.  No paranasal sinus TTP.  No facial swelling.  Throat and mouth without focal lesion.  No pharyngial swelling but mild erythema with clear PND. No exudate.   Neck: supple, mild ant cerv LAD (nontender) LUNGS: CTA bilat, nonlabored resps.   CV: RRR, no m/r/g. EXT:  no c/c/e SKIN: no rash  Rapid strep today was negative  IMPRESSION AND PLAN:  Influenza-like illness Onset about 24h ago. Discussed tx options with pt and decided to use tamiflu 75mg  bid x 7d. Hycodan susp 1-2 tsp q6h prn, #132ml, no RF.  HTN (hypertension), benign BP up here today. Continue current meds. Add clonidine 0.1mg  tab, 1 tab q8h prn bp >160/100. Call if still remaining >160/100 with this plan.   Group A strep throat clx sent today.  FOLLOW UP: prn

## 2013-11-27 NOTE — Assessment & Plan Note (Signed)
Onset about 24h ago. Discussed tx options with pt and decided to use tamiflu 75mg  bid x 7d. Hycodan susp 1-2 tsp q6h prn, #161ml, no RF.

## 2013-11-27 NOTE — Assessment & Plan Note (Signed)
BP up here today. Continue current meds. Add clonidine 0.1mg  tab, 1 tab q8h prn bp >160/100. Call if still remaining >160/100 with this plan.

## 2013-12-01 ENCOUNTER — Telehealth: Payer: Self-pay | Admitting: Family Medicine

## 2013-12-01 DIAGNOSIS — R05 Cough: Secondary | ICD-10-CM

## 2013-12-01 MED ORDER — HYDROCODONE-HOMATROPINE 5-1.5 MG/5ML PO SYRP: ORAL_SOLUTION | ORAL | Status: DC

## 2013-12-01 NOTE — Telephone Encounter (Signed)
Patient notified

## 2013-12-01 NOTE — Telephone Encounter (Signed)
Please advise 

## 2013-12-01 NOTE — Telephone Encounter (Signed)
Hycodan rx printed, but since this is schedule II now he'll have to come to the office and pick it up.

## 2013-12-01 NOTE — Telephone Encounter (Signed)
Patient says he is getting better but is running out of his cough syrup which is helping him. Can he get a refill?

## 2013-12-02 NOTE — Telephone Encounter (Signed)
Patient refused CXR.  He states that it was " not necessary"

## 2013-12-02 NOTE — Telephone Encounter (Signed)
No more tamiflu.  This won't help. I recommend he get a chest x-ray.  I'll put order in for med [facility].

## 2013-12-02 NOTE — Telephone Encounter (Signed)
Patient is still experiencing body aches, cough, headache. Can he get a Tamiflu refill?

## 2013-12-02 NOTE — Telephone Encounter (Signed)
Please advise 

## 2013-12-02 NOTE — Telephone Encounter (Signed)
Noted  

## 2013-12-09 ENCOUNTER — Other Ambulatory Visit: Payer: Self-pay | Admitting: Family Medicine

## 2013-12-09 MED ORDER — LOSARTAN POTASSIUM 100 MG PO TABS: ORAL_TABLET | ORAL | Status: DC

## 2013-12-16 ENCOUNTER — Other Ambulatory Visit: Payer: Self-pay | Admitting: Family Medicine

## 2013-12-16 MED ORDER — LOSARTAN POTASSIUM 100 MG PO TABS: ORAL_TABLET | ORAL | Status: DC

## 2013-12-16 MED ORDER — CLONIDINE HCL 0.1 MG PO TABS: ORAL_TABLET | ORAL | Status: DC

## 2014-04-25 ENCOUNTER — Other Ambulatory Visit: Payer: Self-pay | Admitting: Family Medicine

## 2014-04-27 NOTE — Telephone Encounter (Signed)
Patient refill request came through for pt's cozaar.  Please contact patient to get follow up appointment.  Last seen for chronic illnesses in July of last year.  Rx sent in for 30 day supply.

## 2014-04-29 NOTE — Telephone Encounter (Signed)
Patient is scheduled 05/07/14

## 2014-05-07 ENCOUNTER — Encounter: Payer: Self-pay | Admitting: Family Medicine

## 2014-05-07 ENCOUNTER — Ambulatory Visit (INDEPENDENT_AMBULATORY_CARE_PROVIDER_SITE_OTHER): Payer: BC Managed Care – PPO | Admitting: Family Medicine

## 2014-05-07 VITALS — BP 148/95 | HR 65 | Temp 98.2°F | Resp 18 | Ht 67.0 in | Wt 230.0 lb

## 2014-05-07 DIAGNOSIS — E782 Mixed hyperlipidemia: Secondary | ICD-10-CM

## 2014-05-07 DIAGNOSIS — E669 Obesity, unspecified: Secondary | ICD-10-CM | POA: Insufficient documentation

## 2014-05-07 DIAGNOSIS — I10 Essential (primary) hypertension: Secondary | ICD-10-CM

## 2014-05-07 LAB — LIPID PANEL
CHOL/HDL RATIO: 6
Cholesterol: 203 mg/dL — ABNORMAL HIGH (ref 0–200)
HDL: 34.8 mg/dL — ABNORMAL LOW (ref >39.00)
LDL Cholesterol: 135 mg/dL — ABNORMAL HIGH (ref 0–99)
TRIGLYCERIDES: 168 mg/dL — AB (ref 0.0–149.0)
VLDL: 33.6 mg/dL (ref 0.0–40.0)

## 2014-05-07 LAB — COMPREHENSIVE METABOLIC PANEL
ALT: 28 U/L (ref 0–53)
AST: 23 U/L (ref 0–37)
Albumin: 4.7 g/dL (ref 3.5–5.2)
Alkaline Phosphatase: 43 U/L (ref 39–117)
BUN: 15 mg/dL (ref 6–23)
CALCIUM: 10.4 mg/dL (ref 8.4–10.5)
CHLORIDE: 107 meq/L (ref 96–112)
CO2: 30 meq/L (ref 19–32)
CREATININE: 1.4 mg/dL (ref 0.4–1.5)
GFR: 57.79 mL/min — AB (ref >60.00)
GLUCOSE: 115 mg/dL — AB (ref 70–99)
Potassium: 4.9 mEq/L (ref 3.5–5.1)
SODIUM: 146 meq/L — AB (ref 135–145)
TOTAL PROTEIN: 7.6 g/dL (ref 6.0–8.3)
Total Bilirubin: 2.2 mg/dL — ABNORMAL HIGH (ref 0.2–1.2)

## 2014-05-07 NOTE — Progress Notes (Signed)
Pre visit review using our clinic review tool, if applicable. No additional management support is needed unless otherwise documented below in the visit note. 

## 2014-05-07 NOTE — Progress Notes (Signed)
OFFICE NOTE  05/07/2014  CC:  Chief Complaint  Patient presents with  . Follow-up     HPI: Patient is a 53 y.o. Caucasian male who is here for routine f/u HTN, hyperlipidemia, CAD. Feeling pretty good.  No signif HA's.  Home bp's avg 140/90, sometimes lower. Trying to get more serious about diet and exercise.     Pertinent PMH:  Past medical, surgical, social, and family history reviewed and no changes are noted since last office visit.  MEDS:  Outpatient Prescriptions Prior to Visit  Medication Sig Dispense Refill  . aspirin 325 MG tablet Take 325 mg by mouth daily.      Marland Kitchen atorvastatin (LIPITOR) 40 MG tablet Take 1 tablet (40 mg total) by mouth daily.  30 tablet  2  . losartan (COZAAR) 100 MG tablet TAKE 1 TABLET (100 MG TOTAL) BY MOUTH DAILY.  30 tablet  0  . metoprolol succinate (TOPROL-XL) 50 MG 24 hr tablet Take 1 tablet (50 mg total) by mouth daily. Take with or immediately following a meal.  30 tablet  1  . nitroGLYCERIN (NITROSTAT) 0.4 MG SL tablet Place 1 tablet (0.4 mg total) under the tongue every 5 (five) minutes as needed.  30 tablet  11  . HYDROcodone-homatropine (HYCODAN) 5-1.5 MG/5ML syrup 1-2 tsp po q6h prn cough  120 mL  0  . imiquimod (ALDARA) 5 % cream Apply to affected area qhs --wash off every morning--for 24 days  24 each  1  . oseltamivir (TAMIFLU) 75 MG capsule Take 1 capsule (75 mg total) by mouth 2 (two) times daily.  14 capsule  0  . cloNIDine (CATAPRES) 0.1 MG tablet Take one tablet by mouth every 8 hours BP>160/100  60 tablet  6   No facility-administered medications prior to visit.    PE: Blood pressure 148/95, pulse 65, temperature 98.2 F (36.8 C), temperature source Temporal, resp. rate 18, height 5\' 7"  (1.702 m), weight 230 lb (104.327 kg), SpO2 98.00%. Gen: Alert, well appearing.  Patient is oriented to person, place, time, and situation. CV: RRR, no m/r/g.   LUNGS: CTA bilat, nonlabored resps, good aeration in all lung fields. EXT: no  clubbing, cyanosis, or edema.    IMPRESSION AND PLAN:  1) HTN; The current medical regimen is effective;  continue present plan and medications. CMET today.  2) Hyperlipidemia;The current medical regimen is effective;  continue present plan and medications. FLP today.  3) Obesity/metabolic syndrome: encouraged pt to continue working on diet/exercise and be consistent--this is the key for him.   An After Visit Summary was printed and given to the patient.  FOLLOW UP: 6 mo for fasting CPE

## 2014-05-08 ENCOUNTER — Telehealth: Payer: Self-pay | Admitting: Family Medicine

## 2014-05-08 NOTE — Telephone Encounter (Signed)
Relevant patient education mailed to patient.  

## 2014-05-11 ENCOUNTER — Encounter: Payer: Self-pay | Admitting: Family Medicine

## 2014-05-13 NOTE — Progress Notes (Signed)
Yes, pls send in rx for 30d supply of generic atorvastatin 80mg , 1 tab po qd, with 6 RFs.-thx

## 2014-06-01 ENCOUNTER — Other Ambulatory Visit: Payer: Self-pay | Admitting: Family Medicine

## 2014-06-17 ENCOUNTER — Telehealth (HOSPITAL_COMMUNITY): Payer: Self-pay | Admitting: *Deleted

## 2014-07-02 ENCOUNTER — Other Ambulatory Visit: Payer: Self-pay | Admitting: Family Medicine

## 2014-07-09 ENCOUNTER — Ambulatory Visit (INDEPENDENT_AMBULATORY_CARE_PROVIDER_SITE_OTHER): Payer: BC Managed Care – PPO | Admitting: Family Medicine

## 2014-07-09 ENCOUNTER — Encounter: Payer: Self-pay | Admitting: Family Medicine

## 2014-07-09 VITALS — BP 166/89 | HR 64 | Temp 98.2°F | Resp 18 | Ht 67.0 in | Wt 222.0 lb

## 2014-07-09 DIAGNOSIS — E8881 Metabolic syndrome: Secondary | ICD-10-CM

## 2014-07-09 DIAGNOSIS — A630 Anogenital (venereal) warts: Secondary | ICD-10-CM

## 2014-07-09 DIAGNOSIS — I10 Essential (primary) hypertension: Secondary | ICD-10-CM

## 2014-07-09 DIAGNOSIS — I701 Atherosclerosis of renal artery: Secondary | ICD-10-CM

## 2014-07-09 DIAGNOSIS — Z125 Encounter for screening for malignant neoplasm of prostate: Secondary | ICD-10-CM

## 2014-07-09 DIAGNOSIS — Z0000 Encounter for general adult medical examination without abnormal findings: Secondary | ICD-10-CM

## 2014-07-09 DIAGNOSIS — Z23 Encounter for immunization: Secondary | ICD-10-CM

## 2014-07-09 DIAGNOSIS — E782 Mixed hyperlipidemia: Secondary | ICD-10-CM

## 2014-07-09 DIAGNOSIS — E785 Hyperlipidemia, unspecified: Secondary | ICD-10-CM

## 2014-07-09 LAB — LIPID PANEL
Cholesterol: 181 mg/dL (ref 0–200)
HDL: 35.2 mg/dL — ABNORMAL LOW (ref >39.00)
NonHDL: 145.8
Total CHOL/HDL Ratio: 5
Triglycerides: 202 mg/dL — ABNORMAL HIGH (ref 0.0–149.0)
VLDL: 40.4 mg/dL — ABNORMAL HIGH (ref 0.0–40.0)

## 2014-07-09 LAB — COMPREHENSIVE METABOLIC PANEL
ALBUMIN: 4.6 g/dL (ref 3.5–5.2)
ALT: 23 U/L (ref 0–53)
AST: 21 U/L (ref 0–37)
Alkaline Phosphatase: 43 U/L (ref 39–117)
BUN: 15 mg/dL (ref 6–23)
CALCIUM: 10.3 mg/dL (ref 8.4–10.5)
CO2: 28 meq/L (ref 19–32)
Chloride: 103 mEq/L (ref 96–112)
Creatinine, Ser: 1.1 mg/dL (ref 0.4–1.5)
GFR: 70.8 mL/min (ref >60.00)
GLUCOSE: 93 mg/dL (ref 70–99)
POTASSIUM: 4.2 meq/L (ref 3.5–5.1)
SODIUM: 140 meq/L (ref 135–145)
TOTAL PROTEIN: 7.5 g/dL (ref 6.0–8.3)
Total Bilirubin: 2.2 mg/dL — ABNORMAL HIGH (ref 0.2–1.2)

## 2014-07-09 LAB — HEMOGLOBIN A1C: Hgb A1c MFr Bld: 6 % (ref 4.6–6.5)

## 2014-07-09 LAB — PSA: PSA: 0.36 ng/mL (ref 0.10–4.00)

## 2014-07-09 MED ORDER — CLONIDINE HCL 0.1 MG/24HR TD PTWK: 0.1000 mg | MEDICATED_PATCH | TRANSDERMAL | Status: DC

## 2014-07-09 MED ORDER — IMIQUIMOD 5 % EX CREA: TOPICAL_CREAM | CUTANEOUS | Status: DC

## 2014-07-09 NOTE — Progress Notes (Signed)
Pre visit review using our clinic review tool, if applicable. No additional management support is needed unless otherwise documented below in the visit note. 

## 2014-07-09 NOTE — Progress Notes (Signed)
Office Note 07/12/2014  CC:  Chief Complaint  Patient presents with  . Annual Exam    fasting   HPI:  Eric Hunter is a 53 y.o. White male who is here for annual CPE. About 2 mo ago we increased his lipitor to 80 mg qd.  No side effects felt. Also need to f/u insulin resistance/metabolic syndrome with HbA1c and CMET today.  Home bp monitoring systolics 150s/80s, HR 60s usually.  Taking some clonidine 0.1 but only prn.  Compliant with other bp meds. He is excited b/c he has been making some great dietary changes and has lost some weight.  He had a colonoscopy approx age 53 done in [place].  No polyps.  Brother with colon cancer. Plan is for repeat approx 2017.   Past Medical History  Diagnosis Date  . Coronary artery disease     DES to RCA 2010 in setting of acute event; repeat cath 03/02/10 for chest pain showed widely patent stent and normal LV function.  . Hypertension   . Renal artery stenosis 2011    Discordant data (mild bilat ostial disease on distal aortogram at time of cath, renal arteries appeared normal.  Then renal doppler u/s showed R>L RAS, renal sizes similar.  Recheck of renal artery dopplers 06/2013 showed no significant changes--repeat in 12 months recommended.  . Hyperlipidemia   . Diverticular disease     h/o 'itis  . Right flank pain     apparently had a trigger point b/c once the orthopedist injected the region he had no further pain.  (extensive w/u done for this problem prior to the injection, though).  . Metabolic syndrome   . History of pancreatitis   . History of peptic ulcer disease   . Mood disorder     per old records; anger and irritability + elevated libido--improved on sertraline.  . Genital warts 06/23/2013  . Fatty liver     Past Surgical History  Procedure Laterality Date  . Coronary stent placement  09/14/09    RCA (Dr. Clarene DukeLittle)  . Palate surgery  age 89 mo  . Knee surgery  2007    Rt knee arthroscopy  . Knee surgery  1983   Bilat knee debridement  . Tympanostomy tube placement  1967    Family History  Problem Relation Age of Onset  . Heart attack Father   . Hypertension Father   . Heart attack Sister   . Hypertension Sister   . Heart attack Brother   . Hypertension Brother   . Diabetes Neg Hx     History   Social History  . Marital Status: Married    Spouse Name: N/A    Number of Children: N/A  . Years of Education: N/A   Occupational History  . Not on file.   Social History Main Topics  . Smoking status: Former Smoker    Types: Pipe  . Smokeless tobacco: Never Used     Comment: Pipe Only.  . Alcohol Use: No  . Drug Use: Yes    Special: Marijuana  . Sexual Activity: Not on file   Other Topics Concern  . Not on file   Social History Narrative   Married, no children.   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   Education: BS from [employer]---health sciences.   No tobacco.  Rare use of marijuana (recreational).  No alcohol or drugs (other than marij).   Exercise: yard work,  golf, occ tennis and horseback riding.   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            MEDS: lipitor is actually 80mg  qd  Outpatient Prescriptions Prior to Visit  Medication Sig Dispense Refill  . aspirin 325 MG tablet Take 325 mg by mouth daily.      Marland Kitchen atorvastatin (LIPITOR) 40 MG tablet Take 1 tablet (40 mg total) by mouth daily.  30 tablet  2  . cloNIDine (CATAPRES) 0.1 MG tablet Take one tablet by mouth every 8 hours BP>160/100  60 tablet  6  . losartan (COZAAR) 100 MG tablet TAKE 1 TABLET (100 MG TOTAL) BY MOUTH DAILY.  30 tablet  0  . metoprolol succinate (TOPROL-XL) 50 MG 24 hr tablet Take 1 tablet (50 mg total) by mouth daily. Take with or immediately following a meal.  30 tablet  1  . nitroGLYCERIN (NITROSTAT) 0.4 MG SL tablet Place 1 tablet (0.4 mg total) under the tongue every 5 (five) minutes as  needed.  30 tablet  11   No facility-administered medications prior to visit.    Allergies  Allergen Reactions  . Penicillins Rash and Other (See Comments)    Angio-edema    ROS Review of Systems  Constitutional: Negative for fever, chills, appetite change and fatigue.  HENT: Negative for congestion, dental problem, ear pain and sore throat.   Eyes: Negative for discharge, redness and visual disturbance.  Respiratory: Negative for cough, chest tightness, shortness of breath and wheezing.   Cardiovascular: Negative for chest pain, palpitations and leg swelling.  Gastrointestinal: Negative for nausea, vomiting, abdominal pain, diarrhea and blood in stool.  Genitourinary: Negative for dysuria, urgency, frequency, hematuria, flank pain and difficulty urinating.  Musculoskeletal: Negative for arthralgias, back pain, joint swelling, myalgias and neck stiffness.  Skin: Negative for pallor and rash.  Neurological: Negative for dizziness, speech difficulty, weakness and headaches.  Hematological: Negative for adenopathy. Does not bruise/bleed easily.  Psychiatric/Behavioral: Negative for confusion and sleep disturbance. The patient is not nervous/anxious.     PE; Blood pressure 166/89, pulse 64, temperature 98.2 F (36.8 C), temperature source Temporal, resp. rate 18, height 5\' 7"  (1.702 m), weight 222 lb (100.699 kg), SpO2 98.00%. Gen: Alert, well appearing.  Patient is oriented to person, place, time, and situation. AFFECT: pleasant, lucid thought and speech. ENT: Ears: EACs clear, normal epithelium.  TMs with good light reflex and landmarks bilaterally.  Eyes: no injection, icteris, swelling, or exudate.  EOMI, PERRLA. Nose: no drainage or turbinate edema/swelling.  No injection or focal lesion.  Mouth: lips without lesion/swelling.  Oral mucosa pink and moist.  Dentition intact and without obvious caries or gingival swelling.  Oropharynx without erythema, exudate, or swelling.  Neck:  supple/nontender.  No LAD, mass, or TM.  Carotid pulses 2+ bilaterally, without bruits. CV: RRR, no m/r/g.   LUNGS: CTA bilat, nonlabored resps, good aeration in all lung fields. ABD: soft, NT, ND, BS normal.  No hepatospenomegaly or mass.  No bruits. EXT: no clubbing, cyanosis, or edema.  Musculoskeletal: no joint swelling, erythema, warmth, or tenderness.  ROM of all joints intact. Skin - no sores or suspicious lesions or rashes or color changes Rectal exam: negative without mass, lesions or tenderness, PROSTATE EXAM: smooth and symmetric without nodules or tenderness.   Pertinent labs:  None today  ASSESSMENT AND PLAN:   HTN (hypertension), benign Control not ideal. Start clonidine  0.1 TTS patch, 1 patch q7d.  May still use 0.1mg  clonidine tabs prn for stage 2 bp readings, but call if these are frequent.   Hyperlipemia, mixed Tolerating statin, recent dose increase, due for repeast FLP today.  Renal artery stenosis Due for repeat/follow up renal artery dopplers-ordered today.  Health maintenance examination Reviewed age and gender appropriate health maintenance issues (prudent diet, regular exercise, health risks of tobacco and excessive alcohol, use of seatbelts, fire alarms in home, use of sunscreen).  Also reviewed age and gender appropriate health screening as well as vaccine recommendations. DRE normal today, PSA drawn. Next colonoscopy due 2017. Pneumovax 23 given today.   An After Visit Summary was printed and given to the patient.  FOLLOW UP:  Return in about 6 months (around 01/09/2015) for routine chronic illness f/u.

## 2014-07-10 LAB — LDL CHOLESTEROL, DIRECT: Direct LDL: 115.2 mg/dL

## 2014-07-12 DIAGNOSIS — Z0000 Encounter for general adult medical examination without abnormal findings: Secondary | ICD-10-CM | POA: Insufficient documentation

## 2014-07-12 DIAGNOSIS — A630 Anogenital (venereal) warts: Secondary | ICD-10-CM | POA: Insufficient documentation

## 2014-07-12 NOTE — Assessment & Plan Note (Signed)
Reviewed age and gender appropriate health maintenance issues (prudent diet, regular exercise, health risks of tobacco and excessive alcohol, use of seatbelts, fire alarms in home, use of sunscreen).  Also reviewed age and gender appropriate health screening as well as vaccine recommendations. DRE normal today, PSA drawn. Next colonoscopy due 2017. Pneumovax 23 given today.

## 2014-07-12 NOTE — Assessment & Plan Note (Addendum)
Control not ideal. Start clonidine 0.1 TTS patch, 1 patch q7d.  May still use 0.1mg  clonidine tabs prn for stage 2 bp readings, but call if these are frequent.

## 2014-07-12 NOTE — Assessment & Plan Note (Signed)
Tolerating statin, recent dose increase, due for repeast FLP today.

## 2014-07-12 NOTE — Assessment & Plan Note (Signed)
Due for repeat/follow up renal artery dopplers-ordered today.

## 2014-08-06 ENCOUNTER — Other Ambulatory Visit: Payer: Self-pay | Admitting: Family Medicine

## 2014-08-10 ENCOUNTER — Ambulatory Visit (INDEPENDENT_AMBULATORY_CARE_PROVIDER_SITE_OTHER): Payer: BC Managed Care – PPO | Admitting: Family Medicine

## 2014-08-10 ENCOUNTER — Encounter: Payer: Self-pay | Admitting: Family Medicine

## 2014-08-10 VITALS — BP 168/112 | HR 68 | Temp 97.5°F | Resp 18 | Ht 67.0 in | Wt 214.0 lb

## 2014-08-10 DIAGNOSIS — L259 Unspecified contact dermatitis, unspecified cause: Secondary | ICD-10-CM

## 2014-08-10 DIAGNOSIS — I10 Essential (primary) hypertension: Secondary | ICD-10-CM

## 2014-08-10 MED ORDER — PREDNISONE 20 MG PO TABS: ORAL_TABLET | ORAL | Status: DC

## 2014-08-10 MED ORDER — CLONIDINE HCL 0.2 MG/24HR TD PTWK: 0.2000 mg | MEDICATED_PATCH | TRANSDERMAL | Status: DC

## 2014-08-10 NOTE — Progress Notes (Signed)
Pre visit review using our clinic review tool, if applicable. No additional management support is needed unless otherwise documented below in the visit note. 

## 2014-08-10 NOTE — Progress Notes (Signed)
OFFICE NOTE  08/10/2014  CC:  Chief Complaint  Patient presents with  . Medication Problem     HPI: Patient is a 53 y.o. Caucasian male who is here for concern about rash.  Onset at least a week ago, had been working in yard but not sure about contact with poison ivy/oak.  NO fevers or malaise.  He has tried OTC cortisone ointment--no help. Areas involved are left antecubital fossa and both sides around belt line. Home bps 150s-160s/80s-100 since starting catapres 0.1 patch.  He has also had HAs starting in occipital area that then involve entire head, mild intensity--since starting the catapress. He plans on trying to switch his metoprolol to bedtime dosing to see if this alleviates this HA problem.  Pertinent PMH:  Past medical, surgical, social, and family history reviewed and no changes are noted since last office visit.  MEDS:  Outpatient Prescriptions Prior to Visit  Medication Sig Dispense Refill  . aspirin 325 MG tablet Take 325 mg by mouth daily.      Marland Kitchen atorvastatin (LIPITOR) 40 MG tablet Take 1 tablet (40 mg total) by mouth daily.  30 tablet  2  . cloNIDine (CATAPRES - DOSED IN MG/24 HR) 0.1 mg/24hr patch Place 1 patch (0.1 mg total) onto the skin once a week.  4 patch  12  . imiquimod (ALDARA) 5 % cream Apply to affected area qhs --wash off every morning--for 24 days  24 each  6  . losartan (COZAAR) 100 MG tablet TAKE 1 TABLET (100 MG TOTAL) BY MOUTH DAILY.  30 tablet  0  . metoprolol succinate (TOPROL-XL) 50 MG 24 hr tablet Take 1 tablet (50 mg total) by mouth daily. Take with or immediately following a meal.  30 tablet  1  . nitroGLYCERIN (NITROSTAT) 0.4 MG SL tablet Place 1 tablet (0.4 mg total) under the tongue every 5 (five) minutes as needed.  30 tablet  11  . cloNIDine (CATAPRES) 0.1 MG tablet Take one tablet by mouth every 8 hours BP>160/100  60 tablet  6   No facility-administered medications prior to visit.    PE: Blood pressure 168/112, pulse 68, temperature  97.5 F (36.4 C), temperature source Temporal, resp. rate 18, height  (1.702 m), weight 214 lb (97.07 kg), SpO2 98.00%. Gen: Alert, well appearing.  Patient is oriented to person, place, time, and situation. AFFECT: pleasant, lucid thought and speech. SKIN: patch of pink papular rash in left antecubital fossa without vesicle, pustule, streaking, or tenderness. Similar type rash on large swath of skin over each iliac crest region.  No groin rash.  No rash where patient applies his catapres patch.  IMPRESSION AND PLAN:  1) Contact derm rash: I don't think this rash is coming from his catapres patch. Prednisone  qd x 5d, then  qd x 5d, then  qd x 4d. Therapeutic expectations and side effect profile of medication discussed today.  Patient's questions answered. Monitor closely for HTN effect of steroid.  2) Uncontrolled HTN: he continues to do well with losing weight/TLC. No signif change in bp control with 0.1mg  catapres patch for the last month. Increase to 0.2mg  catapres q week, continue toprol XL  and change to qhs timing of this dose, continue  losartan qd. He'll call in 3-4 d with bp and HA report and if HA's not better then we'll d/c catapres and replace it with amlodipine  qd.  An After Visit Summary was printed and given to the patient.  FOLLOW UP:  as stated above, also--has appt for routine f/u 12/2014.

## 2014-08-18 ENCOUNTER — Other Ambulatory Visit: Payer: Self-pay | Admitting: Family Medicine

## 2014-09-03 ENCOUNTER — Telehealth: Payer: Self-pay

## 2014-09-03 NOTE — Telephone Encounter (Signed)
Pt called and wanted to relay some messages. The first week on the patches were inclusive because they kept coming off. This week great results, his patches stayed on and his average BP was 140/80, feeling great, no headaches and lost another 4 pounds. He states he is feeling better and has no headaches. FYI Dr Milinda Cave.

## 2014-09-03 NOTE — Telephone Encounter (Signed)
Noted  

## 2014-10-16 ENCOUNTER — Encounter: Payer: Self-pay | Admitting: Family Medicine

## 2014-10-16 ENCOUNTER — Other Ambulatory Visit: Payer: Self-pay | Admitting: Family Medicine

## 2014-10-16 ENCOUNTER — Ambulatory Visit (INDEPENDENT_AMBULATORY_CARE_PROVIDER_SITE_OTHER): Payer: BC Managed Care – PPO | Admitting: Family Medicine

## 2014-10-16 VITALS — BP 167/116 | HR 59 | Temp 98.1°F | Resp 18 | Ht 67.0 in | Wt 221.0 lb

## 2014-10-16 DIAGNOSIS — M791 Myalgia, unspecified site: Secondary | ICD-10-CM

## 2014-10-16 DIAGNOSIS — I10 Essential (primary) hypertension: Secondary | ICD-10-CM

## 2014-10-16 DIAGNOSIS — M792 Neuralgia and neuritis, unspecified: Secondary | ICD-10-CM

## 2014-10-16 DIAGNOSIS — I701 Atherosclerosis of renal artery: Secondary | ICD-10-CM

## 2014-10-16 DIAGNOSIS — R109 Unspecified abdominal pain: Secondary | ICD-10-CM

## 2014-10-16 LAB — POCT URINALYSIS DIPSTICK
BILIRUBIN UA: NEGATIVE
GLUCOSE UA: NEGATIVE
KETONES UA: NEGATIVE
Leukocytes, UA: NEGATIVE
Nitrite, UA: NEGATIVE
PH UA: 6.5
Protein, UA: NEGATIVE
RBC UA: NEGATIVE
Spec Grav, UA: 1.015
Urobilinogen, UA: 1

## 2014-10-16 MED ORDER — METHYLPREDNISOLONE ACETATE 40 MG/ML IJ SUSP
40.0000 mg | Freq: Once | INTRAMUSCULAR | Status: AC
  Administered 2014-10-16: 20 mg via INTRA_ARTICULAR

## 2014-10-16 NOTE — Progress Notes (Signed)
Pre visit review using our clinic review tool, if applicable. No additional management support is needed unless otherwise documented below in the visit note. 

## 2014-10-16 NOTE — Addendum Note (Signed)
Addended by: Jeoffrey MassedMCGOWEN, Kieffer Blatz H on: 10/16/2014 05:11 PM   Modules accepted: Level of Service

## 2014-10-16 NOTE — Addendum Note (Signed)
Addended by: Eulah PontALBRIGHT, Cabot Cromartie M on: 10/16/2014 02:27 PM   Modules accepted: Orders

## 2014-10-16 NOTE — Progress Notes (Signed)
OFFICE VISIT  10/16/2014   CC:  Chief Complaint  Patient presents with  . Flank Pain    yesterday   HPI:    Patient is a 53 y.o. Caucasian male who presents for right flank pain onset 24h ago, started as pain in right lower flank that "grabs" him and extends upward towards rib cage.  Dorsal aspect of iliac crest seems to be the focus. Getting up and standing seems to alleviate it.  LYing down or sitting up in chair seems to make it worse. Didn't take any OTC meds for it. In the past, this pain has been eventually dx'd as trigger point and he was given a steroid injection and it helped SIGNIFICANTLY. No radiation of pain into groin area, no hematuria, no polyuria, no dysuria, no abdominal pain.  Home bp monitoring up until this pain showed normal readings mostly, with occ stage 1 systolic.  Past Medical History  Diagnosis Date  . Coronary artery disease     DES to RCA 2010 in setting of acute event; repeat cath 03/02/10 for chest pain showed widely patent stent and normal LV function.  . Hypertension   . Renal artery stenosis 2011    Discordant data (mild bilat ostial disease on distal aortogram at time of cath, renal arteries appeared normal.  Then renal doppler u/s showed R>L RAS, renal sizes similar.  Recheck of renal artery dopplers 06/2013 showed no significant changes--repeat in 12 months recommended.  . Hyperlipidemia   . Diverticular disease     h/o 'itis  . Right flank pain     apparently had a trigger point b/c once the orthopedist injected the region he had no further pain.  (extensive w/u done for this problem prior to the injection, though).  . Metabolic syndrome   . History of pancreatitis   . History of peptic ulcer disease   . Mood disorder     per old records; anger and irritability + elevated libido--improved on sertraline.  . Genital warts 06/23/2013  . Fatty liver     Past Surgical History  Procedure Laterality Date  . Coronary stent placement  09/14/09     RCA (Dr. Clarene DukeLittle)  . Palate surgery  age 53 mo  . Knee surgery  2007    Rt knee arthroscopy  . Knee surgery  1983    Bilat knee debridement  . Tympanostomy tube placement  1967  . Colonoscopy  2007    Normal per pt.  Recall [place] ([place]   MEDS: not on prednisone Outpatient Prescriptions Prior to Visit  Medication Sig Dispense Refill  . aspirin 325 MG tablet Take 325 mg by mouth daily.    Marland Kitchen. atorvastatin (LIPITOR) 40 MG tablet Take 1 tablet (40 mg total) by mouth daily. 30 tablet 2  . cloNIDine (CATAPRES - DOSED IN MG/24 HR) 0.2 mg/24hr patch Place 1 patch (0.2 mg total) onto the skin once a week. 4 patch 6  . imiquimod (ALDARA) 5 % cream Apply to affected area qhs --wash off every morning--for 24 days 24 each 6  . losartan (COZAAR) 100 MG tablet TAKE 1 TABLET (100 MG TOTAL) BY MOUTH DAILY. 30 tablet 0  . metoprolol succinate (TOPROL-XL) 50 MG 24 hr tablet TAKE 1 TABLET (50 MG TOTAL) BY MOUTH DAILY. TAKE WITH OR IMMEDIATELY FOLLOWING A MEAL. 30 tablet 1  . nitroGLYCERIN (NITROSTAT) 0.4 MG SL tablet Place 1 tablet (0.4 mg total) under the tongue every 5 (five) minutes as needed. 30  tablet 11  . predniSONE (DELTASONE) 20 MG tablet 2 tabs po qd x 5d, then 1 tab po qd x 5d, then 1/2 tab po qd x 4d, then stop 17 tablet 0   No facility-administered medications prior to visit.    Allergies  Allergen Reactions  . Penicillins Rash and Other (See Comments)    Angio-edema   ROS As per HPI  PE: Blood pressure 167/116, pulse 59, temperature 98.1 F (36.7 C), temperature source Temporal, resp. rate 18, height 5\' 7"  (1.702 m), weight 221 lb (100.245 kg), SpO2 99 %. Gen: Alert, well appearing.  Patient is oriented to person, place, time, and situation. CV: RRR, no m/r/g.   LUNGS: CTA bilat, nonlabored resps, good aeration in all lung fields. ABD: soft, NT/ND Right rib cage and flank are nontender.  In fact, palpation in area of pain ALLEVIATES some of his pain in the right  flank. Palpation along T11-12 level in right paraspinous soft tissues reveals a significant "trigger point" of tenderness.  LABS:  CC UA today: normal  IMPRESSION AND PLAN:  1) Trigger point: pt was in favor of injection of steroid into this today. Procedure: Trigger point injection today in right low/mid back paraspinous soft tissues. Used 1 inch 25 gauge needle with 1/2 cc of depo medrol 40mg /ml + 1/2 cc of 2% lidocaine to inject the area of most tenderness. Pt tolerated procedure well, no immediate complications.  In fact, about 30 seconds after the injection pt voiced resolution of his pain.  2) Poorly controlled HTN: better lately, but pt has questionable hx of RAS and he inadvertently missed appt to get renal artery doppler f/u 06/2014, so I'll reorder this.  An After Visit Summary was printed and given to the patient.  FOLLOW UP: prn

## 2014-10-23 ENCOUNTER — Ambulatory Visit
Admission: RE | Admit: 2014-10-23 | Discharge: 2014-10-23 | Disposition: A | Discharge status: Home / Self Care | Payer: BC Managed Care – PPO | Source: Ambulatory Visit | Attending: Family Medicine | Admitting: Family Medicine

## 2014-10-23 ENCOUNTER — Other Ambulatory Visit: Payer: Self-pay | Admitting: *Deleted

## 2014-10-23 ENCOUNTER — Other Ambulatory Visit: Payer: Self-pay | Admitting: Family Medicine

## 2014-10-23 DIAGNOSIS — I701 Atherosclerosis of renal artery: Secondary | ICD-10-CM

## 2014-10-23 DIAGNOSIS — I10 Essential (primary) hypertension: Secondary | ICD-10-CM

## 2014-10-23 MED ORDER — METOPROLOL SUCCINATE ER 50 MG PO TB24: ORAL_TABLET | ORAL | Status: DC

## 2014-10-30 ENCOUNTER — Other Ambulatory Visit: Payer: BC Managed Care – PPO

## 2014-11-02 ENCOUNTER — Other Ambulatory Visit: Payer: BC Managed Care – PPO

## 2014-12-14 ENCOUNTER — Other Ambulatory Visit: Payer: Self-pay | Admitting: Family Medicine

## 2014-12-14 MED ORDER — LOSARTAN POTASSIUM 100 MG PO TABS: ORAL_TABLET | ORAL | Status: DC

## 2014-12-30 ENCOUNTER — Ambulatory Visit: Payer: BC Managed Care – PPO | Admitting: Family Medicine

## 2015-01-08 ENCOUNTER — Telehealth: Payer: Self-pay | Admitting: Family Medicine

## 2015-01-08 NOTE — Telephone Encounter (Signed)
Flexril 5 MG [facility]. Please call patient.

## 2015-01-08 NOTE — Telephone Encounter (Signed)
Patient requesting Rx for flexeril because he had taken them before when he saw Dr. Foy GuadalajaraFried for muscular skeletal pain in back and shoulder.  Can he get Rx or does he needs OV?  Please advise.

## 2015-01-11 MED ORDER — CYCLOBENZAPRINE HCL 5 MG PO TABS: ORAL_TABLET | ORAL | Status: DC

## 2015-01-11 NOTE — Telephone Encounter (Signed)
Flexeril 5mg  rx sent to pt's pharmacy per his request.

## 2015-02-04 ENCOUNTER — Other Ambulatory Visit: Payer: Self-pay | Admitting: Family Medicine

## 2015-02-04 MED ORDER — METOPROLOL SUCCINATE ER 50 MG PO TB24: ORAL_TABLET | ORAL | Status: DC

## 2015-02-23 ENCOUNTER — Encounter: Payer: Self-pay | Admitting: Family Medicine

## 2015-02-23 ENCOUNTER — Ambulatory Visit (INDEPENDENT_AMBULATORY_CARE_PROVIDER_SITE_OTHER): Payer: BLUE CROSS/BLUE SHIELD | Admitting: Family Medicine

## 2015-02-23 VITALS — BP 158/106 | HR 71 | Temp 97.3°F | Resp 18 | Ht 67.0 in | Wt 223.0 lb

## 2015-02-23 DIAGNOSIS — S61452A Open bite of left hand, initial encounter: Secondary | ICD-10-CM

## 2015-02-23 DIAGNOSIS — Z23 Encounter for immunization: Secondary | ICD-10-CM

## 2015-02-23 DIAGNOSIS — S51852A Open bite of left forearm, initial encounter: Secondary | ICD-10-CM

## 2015-02-23 DIAGNOSIS — L03119 Cellulitis of unspecified part of limb: Secondary | ICD-10-CM

## 2015-02-23 DIAGNOSIS — W540XXA Bitten by dog, initial encounter: Secondary | ICD-10-CM

## 2015-02-23 MED ORDER — CEPHALEXIN 500 MG PO CAPS: 500.0000 mg | ORAL_CAPSULE | Freq: Three times a day (TID) | ORAL | Status: DC

## 2015-02-23 MED ORDER — CEFTRIAXONE SODIUM 1 G IJ SOLR
1.0000 g | Freq: Once | INTRAMUSCULAR | Status: AC
  Administered 2015-02-23: 1 g via INTRAMUSCULAR

## 2015-02-23 NOTE — Progress Notes (Signed)
OFFICE NOTE  02/23/2015  CC:  Chief Complaint  Patient presents with  . Animal Bite    left hand   HPI: Patient is a 54 y.o. Caucasian male who is here for left hand bite. Was breaking up a scuffle between his dogs when they were eating recently (3 days ago), as he held one of the dogs by the neck it bit his left wrist area and then again on proximal hand region.  Has some abrasions and puncture wounds. One puncture wound is more tender than the others and has drained a bit of serosanquinous-looking fluid when he applies pressure.  Warm compresses and hydrogen peroxide helping.  Moderate pain. No fever or malaise.    Pertinent PMH:  Past medical, surgical, social, and family history reviewed and no changes are noted since last office visit.  MEDS:  Outpatient Prescriptions Prior to Visit  Medication Sig Dispense Refill  . aspirin 325 MG tablet Take 325 mg by mouth daily.    Marland Kitchen. atorvastatin (LIPITOR) 40 MG tablet Take 1 tablet (40 mg total) by mouth daily. 30 tablet 2  . losartan (COZAAR) 100 MG tablet TAKE 1 TABLET (100 MG TOTAL) BY MOUTH DAILY. 30 tablet 3  . metoprolol succinate (TOPROL-XL) 50 MG 24 hr tablet TAKE 1 TABLET (50 MG TOTAL) BY MOUTH DAILY. TAKE WITH OR IMMEDIATELY FOLLOWING A MEAL. 30 tablet 3  . nitroGLYCERIN (NITROSTAT) 0.4 MG SL tablet Place 1 tablet (0.4 mg total) under the tongue every 5 (five) minutes as needed. 30 tablet 11  . cloNIDine (CATAPRES - DOSED IN MG/24 HR) 0.2 mg/24hr patch Place 1 patch (0.2 mg total) onto the skin once a week. (Patient not taking: Reported on 02/23/2015) 4 patch 6  . cyclobenzaprine (FLEXERIL) 5 MG tablet 1-2 tabs po q8h prn muscle spasms (Patient not taking: Reported on 02/23/2015) 30 tablet 1  . imiquimod (ALDARA) 5 % cream Apply to affected area qhs --wash off every morning--for 24 days (Patient not taking: Reported on 02/23/2015) 24 each 6   No facility-administered medications prior to visit.    PE: Blood pressure 158/106, pulse  71, temperature 97.3 F (36.3 C), temperature source Temporal, resp. rate 18, height 5\' 7"  (1.702 m), weight 223 lb (101.152 kg), SpO2 96 %. Gen: Alert, well appearing.  Patient is oriented to person, place, time, and situation. Left forearm with a few superficial abrasions that are scabbed, with minimal tenderness and erythema, no drainage. Puncture wound on dorsal an volar aspect of wrist with some erythema, tenderness, and warmth.  No areas of drainage noted, no nodules or fluctuance or streaking.  Mild soft tissue swelling diffusely on dorsal surface of hand up to the beginning of his fingers.  IMPRESSION AND PLAN:  1) Dog bite, acute cellulitis.  No sign of systemic illness. Tetanus is UTD. Will give rocephin 1g IM in office (penicillin allergic but pt states he has taken cephalosporins in the past w/out problem). Start cephalexin 500 mg tid x 7d tomorrow. Signs/symptoms to call or return for were reviewed and pt expressed understanding.  An After Visit Summary was printed and given to the patient.  FOLLOW UP: prn

## 2015-02-23 NOTE — Progress Notes (Signed)
Pre visit review using our clinic review tool, if applicable. No additional management support is needed unless otherwise documented below in the visit note. 

## 2015-04-26 ENCOUNTER — Other Ambulatory Visit: Payer: Self-pay | Admitting: *Deleted

## 2015-04-26 MED ORDER — LOSARTAN POTASSIUM 100 MG PO TABS: ORAL_TABLET | ORAL | Status: DC

## 2015-04-26 NOTE — Telephone Encounter (Signed)
[facility] requesting refill for Losartan Potassium 100mg  1 tab daily. LOV 10/16/14, no up coming ov, last written 12/14/14 w/ 3RF. Rx sent for #30 w/ 0RF, pt need ov.

## 2015-05-25 ENCOUNTER — Other Ambulatory Visit: Payer: Self-pay | Admitting: *Deleted

## 2015-05-25 NOTE — Telephone Encounter (Signed)
[facility] Losartan. LOV 02/23/15 (acute visit) 10/16/14 (f/u visit), no up coming office visit. Last written 04/26/15 #30 w/ 0RF (needed ov). Left message on cell vm to call back. Needs to schedule ov before refill can be sent.

## 2015-05-26 ENCOUNTER — Other Ambulatory Visit: Payer: Self-pay | Admitting: Family Medicine

## 2015-05-26 MED ORDER — LOSARTAN POTASSIUM 100 MG PO TABS: ORAL_TABLET | ORAL | Status: DC

## 2015-05-31 NOTE — Telephone Encounter (Signed)
Rx approved and sent in by [facility] on 05/26/15.

## 2015-06-21 ENCOUNTER — Other Ambulatory Visit: Payer: Self-pay | Admitting: *Deleted

## 2015-06-21 MED ORDER — METOPROLOL SUCCINATE ER 50 MG PO TB24: ORAL_TABLET | ORAL | Status: DC

## 2015-06-21 NOTE — Telephone Encounter (Signed)
RF request for metoprolol.  LOV: 02/23/15 Next ov: None Last written: 02/04/15 #30 w/ 3RF

## 2015-10-14 ENCOUNTER — Ambulatory Visit (INDEPENDENT_AMBULATORY_CARE_PROVIDER_SITE_OTHER): Payer: BLUE CROSS/BLUE SHIELD | Admitting: Family Medicine

## 2015-10-14 ENCOUNTER — Encounter: Payer: Self-pay | Admitting: Family Medicine

## 2015-10-14 VITALS — BP 133/88 | HR 63 | Temp 98.3°F | Resp 16 | Ht 67.0 in | Wt 220.0 lb

## 2015-10-14 DIAGNOSIS — I10 Essential (primary) hypertension: Secondary | ICD-10-CM

## 2015-10-14 DIAGNOSIS — E785 Hyperlipidemia, unspecified: Secondary | ICD-10-CM

## 2015-10-14 DIAGNOSIS — M7061 Trochanteric bursitis, right hip: Secondary | ICD-10-CM

## 2015-10-14 DIAGNOSIS — Z23 Encounter for immunization: Secondary | ICD-10-CM

## 2015-10-14 DIAGNOSIS — E8881 Metabolic syndrome: Secondary | ICD-10-CM

## 2015-10-14 LAB — COMPREHENSIVE METABOLIC PANEL
ALT: 17 U/L (ref 0–53)
AST: 15 U/L (ref 0–37)
Albumin: 4.4 g/dL (ref 3.5–5.2)
Alkaline Phosphatase: 46 U/L (ref 39–117)
BUN: 17 mg/dL (ref 6–23)
CALCIUM: 10.2 mg/dL (ref 8.4–10.5)
CHLORIDE: 106 meq/L (ref 96–112)
CO2: 29 mEq/L (ref 19–32)
Creatinine, Ser: 1.27 mg/dL (ref 0.40–1.50)
GFR: 62.22 mL/min (ref >60.00)
Glucose, Bld: 100 mg/dL — ABNORMAL HIGH (ref 70–99)
POTASSIUM: 4.4 meq/L (ref 3.5–5.1)
Sodium: 141 mEq/L (ref 135–145)
Total Bilirubin: 1.2 mg/dL (ref 0.2–1.2)
Total Protein: 7.3 g/dL (ref 6.0–8.3)

## 2015-10-14 LAB — HEMOGLOBIN A1C: Hgb A1c MFr Bld: 6 % (ref 4.6–6.5)

## 2015-10-14 NOTE — Progress Notes (Signed)
Pre visit review using our clinic review tool, if applicable. No additional management support is needed unless otherwise documented below in the visit note. 

## 2015-10-14 NOTE — Addendum Note (Signed)
Addended by: Westley HummerKIRBY, HEATHER L on: 10/14/2015 08:30 AM   Modules accepted: Orders

## 2015-10-14 NOTE — Progress Notes (Signed)
OFFICE VISIT  10/14/2015   CC:  Chief Complaint  Patient presents with  . Hip Pain    right x 3 weeks   HPI:    Patient is a 54 y.o. Caucasian male who presents for pain in R hip area for about the last 3 wks. Started after playing more tennis and golf.  Points to lateral hip and glut, extends down lateral aspect of R thigh down to knee.  Worse with sitting and with striding forward in his gait.  Intensity is 2-6/10 intensity.  No trauma to the area.  Hx of metabolic syndrome/prediabetes: Exercising pretty regularly, trying to eat a better diet. HTN: occ home bp monitoring near normal or normal every time per pt. Compliant with all chronic meds.  Not fasting today.   Past Medical History  Diagnosis Date  . Coronary artery disease     DES to RCA 2010 in setting of acute event; repeat cath 03/02/10 for chest pain showed widely patent stent and normal LV function.  . Hypertension   . Renal artery stenosis (HCC) 2011    Discordant data (mild bilat ostial disease on distal aortogram at time of cath, renal arteries appeared normal.  Then renal doppler u/s showed R>L RAS, renal sizes similar.  Recheck of renal artery dopplers 06/2013 showed no significant changes--repeat in 12 months recommended.  . Hyperlipidemia   . Diverticular disease     h/o 'itis  . Right flank pain     apparently had a trigger point b/c once the orthopedist injected the region he had no further pain.  (extensive w/u done for this problem prior to the injection, though).  . Metabolic syndrome   . History of pancreatitis   . History of peptic ulcer disease   . Mood disorder (HCC)     per old records; anger and irritability + elevated libido--improved on sertraline.  . Genital warts 06/23/2013  . Fatty liver     Past Surgical History  Procedure Laterality Date  . Coronary stent placement  09/14/09    RCA (Dr. Clarene DukeLittle)  . Palate surgery  age 56 mo  . Knee surgery  2007    Rt knee arthroscopy  . Knee surgery   1983    Bilat knee debridement  . Tympanostomy tube placement  1967  . Colonoscopy  2007    Normal per pt.  Recall 2017 (done in [place]    Outpatient Prescriptions Prior to Visit  Medication Sig Dispense Refill  . aspirin 325 MG tablet Take 325 mg by mouth daily.    Marland Kitchen. atorvastatin (LIPITOR) 40 MG tablet Take 1 tablet (40 mg total) by mouth daily. 30 tablet 2  . losartan (COZAAR) 100 MG tablet TAKE 1 TABLET (100 MG TOTAL) BY MOUTH DAILY. 30 tablet 6  . metoprolol succinate (TOPROL-XL) 50 MG 24 hr tablet TAKE 1 TABLET (50 MG TOTAL) BY MOUTH DAILY. TAKE WITH OR IMMEDIATELY FOLLOWING A MEAL. 30 tablet 3  . nitroGLYCERIN (NITROSTAT) 0.4 MG SL tablet Place 1 tablet (0.4 mg total) under the tongue every 5 (five) minutes as needed. 30 tablet 11  . cephALEXin (KEFLEX) 500 MG capsule Take 1 capsule (500 mg total) by mouth 3 (three) times daily. (Patient not taking: Reported on 10/14/2015) 21 capsule 0  . cloNIDine (CATAPRES - DOSED IN MG/24 HR) 0.2 mg/24hr patch Place 1 patch (0.2 mg total) onto the skin once a week. (Patient not taking: Reported on 02/23/2015) 4 patch 6  . cyclobenzaprine (FLEXERIL) 5 MG  tablet 1-2 tabs po q8h prn muscle spasms (Patient not taking: Reported on 02/23/2015) 30 tablet 1  . imiquimod (ALDARA) 5 % cream Apply to affected area qhs --wash off every morning--for 24 days (Patient not taking: Reported on 02/23/2015) 24 each 6   No facility-administered medications prior to visit.    Allergies  Allergen Reactions  . Penicillins Rash and Other (See Comments)    Angio-edema    ROS As per HPI  PE: Blood pressure 133/88, pulse 63, temperature 98.3 F (36.8 C), temperature source Oral, resp. rate 16, height  (1.702 m), weight 220 lb (99.791 kg), SpO2 94 %. Gen: Alert, well appearing.  Patient is oriented to person, place, time, and situation. No glut tenderness.  Hip ROM fully intact w/out pain. Focal TTP over R greater trochanter.  Tensor fascia lata seems a bit  tight on R. No knee pain/popping/or tenderness on R.  LABS:    Chemistry      Component Value Date/Time   NA 140 07/09/2014 1018   K 4.2 07/09/2014 1018   CL 103 07/09/2014 1018   CO2 28 07/09/2014 1018   BUN 15 07/09/2014 1018   CREATININE 1.1 07/09/2014 1018      Component Value Date/Time   CALCIUM 10.3 07/09/2014 1018   ALKPHOS 43 07/09/2014 1018   AST 21 07/09/2014 1018   ALT 23 07/09/2014 1018   BILITOT 2.2* 07/09/2014 1018       IMPRESSION AND PLAN:  1) R greater trochanteric bursitis: PT referral.  Tylenol prn.  Activity modification short term.  2) Metabolic syndrome/prediabetes: recheck glucose (not fasting) and HbA1c today.  3) HTN; The current medical regimen is effective;  continue present plan and medications. Check BMET today.  4) Hyperlipidemia: tolerating statin.  Not fasting for lipid panel recheck today.  Check AST/ALT today. Continue diet/exercise.  Flu vaccine given today.  An After Visit Summary was printed and given to the patient.  FOLLOW UP: Return in about 6 months (around 04/12/2016) for routine chronic illness f/u.

## 2015-11-02 ENCOUNTER — Other Ambulatory Visit: Payer: Self-pay | Admitting: *Deleted

## 2015-11-02 MED ORDER — METOPROLOL SUCCINATE ER 50 MG PO TB24: ORAL_TABLET | ORAL | Status: DC

## 2015-11-02 NOTE — Telephone Encounter (Signed)
RF request for metoprolol LOV: 10/14/15 Next ov: 04/12/16 Last written: 06/21/15 #30 w/ 3RF

## 2016-01-10 ENCOUNTER — Other Ambulatory Visit: Payer: Self-pay | Admitting: *Deleted

## 2016-01-10 MED ORDER — LOSARTAN POTASSIUM 100 MG PO TABS: ORAL_TABLET | ORAL | Status: DC

## 2016-01-10 NOTE — Telephone Encounter (Signed)
RF request for losartan LOV: 10/14/15 Next ov: 04/12/16 Last written: 05/26/15 #30 w/ 6RF

## 2016-04-12 ENCOUNTER — Ambulatory Visit: Payer: BLUE CROSS/BLUE SHIELD | Admitting: Family Medicine

## 2016-04-12 DIAGNOSIS — Z0289 Encounter for other administrative examinations: Secondary | ICD-10-CM

## 2016-10-03 DIAGNOSIS — M79606 Pain in leg, unspecified: Secondary | ICD-10-CM | POA: Diagnosis not present

## 2016-10-09 DIAGNOSIS — Z4802 Encounter for removal of sutures: Secondary | ICD-10-CM | POA: Diagnosis not present

## 2016-10-24 DIAGNOSIS — L03119 Cellulitis of unspecified part of limb: Secondary | ICD-10-CM | POA: Diagnosis not present

## 2016-10-24 DIAGNOSIS — I10 Essential (primary) hypertension: Secondary | ICD-10-CM | POA: Diagnosis not present

## 2016-11-15 ENCOUNTER — Other Ambulatory Visit: Payer: Self-pay | Admitting: *Deleted

## 2016-11-15 MED ORDER — METOPROLOL SUCCINATE ER 50 MG PO TB24: ORAL_TABLET | ORAL | 0 refills | Status: DC

## 2016-11-15 NOTE — Telephone Encounter (Signed)
[facility].  LOV: 10/14/15 NOV: None Last written: 11/02/15 #30 w/ [id number]  Will send Rx for #30 w/ 0RF. Pt is over due for f/u RCI, needs office visit for more refills.

## 2016-11-15 NOTE — Telephone Encounter (Signed)
Pt advised and voiced understanding.  Apt made for 11/24/16 at 11:15am.

## 2016-11-15 NOTE — Telephone Encounter (Signed)
Left message for pt to call back  °

## 2016-11-24 ENCOUNTER — Encounter: Payer: Self-pay | Admitting: Family Medicine

## 2016-11-24 ENCOUNTER — Ambulatory Visit (INDEPENDENT_AMBULATORY_CARE_PROVIDER_SITE_OTHER): Payer: BLUE CROSS/BLUE SHIELD | Admitting: Family Medicine

## 2016-11-24 VITALS — BP 156/87 | HR 48 | Temp 98.3°F | Resp 16 | Ht 67.0 in | Wt 230.5 lb

## 2016-11-24 DIAGNOSIS — I2510 Atherosclerotic heart disease of native coronary artery without angina pectoris: Secondary | ICD-10-CM | POA: Diagnosis not present

## 2016-11-24 DIAGNOSIS — I10 Essential (primary) hypertension: Secondary | ICD-10-CM | POA: Diagnosis not present

## 2016-11-24 DIAGNOSIS — E782 Mixed hyperlipidemia: Secondary | ICD-10-CM

## 2016-11-24 DIAGNOSIS — E8881 Metabolic syndrome: Secondary | ICD-10-CM

## 2016-11-24 DIAGNOSIS — E88819 Insulin resistance, unspecified: Secondary | ICD-10-CM | POA: Insufficient documentation

## 2016-11-24 LAB — COMPREHENSIVE METABOLIC PANEL
ALBUMIN: 4.7 g/dL (ref 3.5–5.2)
ALT: 20 U/L (ref 0–53)
AST: 17 U/L (ref 0–37)
Alkaline Phosphatase: 41 U/L (ref 39–117)
BILIRUBIN TOTAL: 1.6 mg/dL — AB (ref 0.2–1.2)
BUN: 16 mg/dL (ref 6–23)
CALCIUM: 10.2 mg/dL (ref 8.4–10.5)
CHLORIDE: 104 meq/L (ref 96–112)
CO2: 30 mEq/L (ref 19–32)
CREATININE: 1.14 mg/dL (ref 0.40–1.50)
GFR: 70.2 mL/min (ref >60.00)
Glucose, Bld: 102 mg/dL — ABNORMAL HIGH (ref 70–99)
Potassium: 4.4 mEq/L (ref 3.5–5.1)
Sodium: 141 mEq/L (ref 135–145)
Total Protein: 7.3 g/dL (ref 6.0–8.3)

## 2016-11-24 LAB — LIPID PANEL
CHOLESTEROL: 193 mg/dL (ref 0–200)
HDL: 39.3 mg/dL (ref >39.00)
LDL Cholesterol: 119 mg/dL — ABNORMAL HIGH (ref 0–99)
NonHDL: 153.63
TRIGLYCERIDES: 171 mg/dL — AB (ref 0.0–149.0)
Total CHOL/HDL Ratio: 5
VLDL: 34.2 mg/dL (ref 0.0–40.0)

## 2016-11-24 LAB — HEMOGLOBIN A1C: Hgb A1c MFr Bld: 6.3 % (ref 4.6–6.5)

## 2016-11-24 NOTE — Progress Notes (Signed)
Pre visit review using our clinic review tool, if applicable. No additional management support is needed unless otherwise documented below in the visit note. 

## 2016-11-24 NOTE — Progress Notes (Signed)
OFFICE VISIT  11/24/2016   CC:  Chief Complaint  Patient presents with  . Follow-up    RCI, pt is fasting.    HPI:    Patient is a 55 y.o. Caucasian male who presents for f/u HTN, HLD, metabolic syndrome, hx of CAD with stent. Not exercising any lately due to wound on LLE---this is healing nicely. Diet: avoiding white foods and high fructuse drinks.  Cut out french fries.  BP systolics 140-146/88, HR 70s.  He stresses to me today that when he is more active/exercising, that his bp is better controlled.  HLD: taking generic lipitor w/out side effect.  ROS: no CP, no SOB, no palpitations, no LE swelling, no palpitations.   Past Medical History:  Diagnosis Date  . Coronary artery disease    DES to RCA 2010 in setting of acute event; repeat cath 03/02/10 for chest pain showed widely patent stent and normal LV function.  . Diverticular disease    h/o 'itis  . Fatty liver   . Genital warts 06/23/2013  . History of pancreatitis   . History of peptic ulcer disease   . Hyperlipidemia   . Hypertension   . Insulin resistance 11/24/2016  . Metabolic syndrome   . Mood disorder (HCC)    per old records; anger and irritability + elevated libido--improved on sertraline.  . Renal artery stenosis (HCC) 2011   Discordant data (mild bilat ostial disease on distal aortogram at time of cath, renal arteries appeared normal.  Then renal doppler u/s showed R>L RAS, renal sizes similar.  Recheck of renal artery dopplers 06/2013 showed no significant changes--repeat in 12 months recommended.  . Right flank pain    apparently had a trigger point b/c once the orthopedist injected the region he had no further pain.  (extensive w/u done for this problem prior to the injection, though).    Past Surgical History:  Procedure Laterality Date  . COLONOSCOPY  2007   Normal per pt.  Recall 2017 (done in [place]  . CORONARY STENT PLACEMENT  09/14/09   RCA (Dr. Clarene DukeLittle)  . KNEE SURGERY  2007   Rt knee  arthroscopy  . KNEE SURGERY  1983   Bilat knee debridement  . PALATE SURGERY  age 57 mo  . TYMPANOSTOMY TUBE PLACEMENT  1967    Outpatient Medications Prior to Visit  Medication Sig Dispense Refill  . aspirin 325 MG tablet Take 325 mg by mouth daily.    Marland Kitchen. atorvastatin (LIPITOR) 40 MG tablet Take 1 tablet (40 mg total) by mouth daily. 30 tablet 2  . losartan (COZAAR) 100 MG tablet TAKE 1 TABLET (100 MG TOTAL) BY MOUTH DAILY. 30 tablet 11  . metoprolol succinate (TOPROL-XL) 50 MG 24 hr tablet TAKE 1 TABLET (50 MG TOTAL) BY MOUTH DAILY. TAKE WITH OR IMMEDIATELY FOLLOWING A MEAL. 30 tablet 0  . nitroGLYCERIN (NITROSTAT) 0.4 MG SL tablet Place 1 tablet (0.4 mg total) under the tongue every 5 (five) minutes as needed. 30 tablet 11   No facility-administered medications prior to visit.     Allergies  Allergen Reactions  . Penicillins Rash and Other (See Comments)    Angio-edema    ROS As per HPI  PE: Blood pressure (!) 156/87, pulse (!) 48, temperature 98.3 F (36.8 C), temperature source Oral, resp. rate 16, height 5\' 7"  (1.702 m), weight 230 lb 8 oz (104.6 kg), SpO2 95 %. Gen: Alert, well appearing.  Patient is oriented to person, place, time, and situation.  CV: RRR, no m/r/g.   LUNGS: CTA bilat, nonlabored resps, good aeration in all lung fields. EXT: no clubbing, cyanosis, or edema.    LABS:   Lab Results  Component Value Date   WBC 7.3 08/19/2012   HGB 15.6 08/19/2012   HCT 46.0 08/19/2012   MCV 82.1 08/19/2012   PLT 217 08/19/2012   Lab Results  Component Value Date   CREATININE 1.27 10/14/2015   BUN 17 10/14/2015   NA 141 10/14/2015   K 4.4 10/14/2015   CL 106 10/14/2015   CO2 29 10/14/2015   Lab Results  Component Value Date   ALT 17 10/14/2015   AST 15 10/14/2015   ALKPHOS 46 10/14/2015   BILITOT 1.2 10/14/2015   Lab Results  Component Value Date   CHOL 181 07/09/2014   Lab Results  Component Value Date   HDL 35.20 (L) 07/09/2014   Lab Results   Component Value Date   LDLCALC 135 (H) 05/07/2014   Lab Results  Component Value Date   TRIG 202.0 (H) 07/09/2014   Lab Results  Component Value Date   CHOLHDL 5 07/09/2014   Lab Results  Component Value Date   PSA 0.36 07/09/2014   Lab Results  Component Value Date   HGBA1C 6.0 10/14/2015    IMPRESSION AND PLAN:  1) HTN; not ideal control but no med change today b/c pt will start exercising again soon, which he says always brings it under control. Lytes/cr today.  2) HLD: tolerating statin.  FLP today, AST and ALT today.  3) Insulin resistance: he is doing fine on TLC except for recent hiccup in exercise regimen. HbA1c today.  4) Hx of CAD, hx of stent: asymptomatic.  Continue diet and exercise, ASA, BB, statin.  An After Visit Summary was printed and given to the patient.  FOLLOW UP: Return in about 6 months (around 05/25/2017) for annual CPE (fasting).  Signed:  Santiago BumpersPhil Juliya Magill, MD           11/24/2016

## 2016-11-28 ENCOUNTER — Encounter: Payer: Self-pay | Admitting: *Deleted

## 2016-11-28 ENCOUNTER — Other Ambulatory Visit: Payer: Self-pay | Admitting: *Deleted

## 2016-11-28 DIAGNOSIS — E782 Mixed hyperlipidemia: Secondary | ICD-10-CM

## 2016-11-28 DIAGNOSIS — R7303 Prediabetes: Secondary | ICD-10-CM | POA: Insufficient documentation

## 2016-11-28 MED ORDER — ATORVASTATIN CALCIUM 80 MG PO TABS: 80.0000 mg | ORAL_TABLET | Freq: Every day | ORAL | 3 refills | Status: DC

## 2016-11-28 MED ORDER — METFORMIN HCL 500 MG PO TABS: 500.0000 mg | ORAL_TABLET | Freq: Two times a day (BID) | ORAL | 3 refills | Status: DC

## 2016-12-21 ENCOUNTER — Other Ambulatory Visit: Payer: Self-pay | Admitting: *Deleted

## 2016-12-21 MED ORDER — METOPROLOL SUCCINATE ER 50 MG PO TB24: ORAL_TABLET | ORAL | 6 refills | Status: DC

## 2016-12-21 NOTE — Telephone Encounter (Signed)
[facility] from [facility]  RF request for metoprolol LOV: 11/24/16 Next ov: None Last written: 11/15/16 #30 w/ 0RF

## 2017-01-01 ENCOUNTER — Telehealth: Payer: Self-pay | Admitting: Family Medicine

## 2017-01-01 ENCOUNTER — Emergency Department (HOSPITAL_COMMUNITY)
Admission: EM | Admit: 2017-01-01 | Discharge: 2017-01-01 | Disposition: A | Discharge status: Home / Self Care | Payer: BLUE CROSS/BLUE SHIELD | Attending: Dermatology | Admitting: Dermatology

## 2017-01-01 DIAGNOSIS — I10 Essential (primary) hypertension: Secondary | ICD-10-CM | POA: Insufficient documentation

## 2017-01-01 DIAGNOSIS — Z5321 Procedure and treatment not carried out due to patient leaving prior to being seen by health care provider: Secondary | ICD-10-CM | POA: Diagnosis not present

## 2017-01-01 NOTE — Telephone Encounter (Signed)
Patient is having a headache. [facility] like to Rx [facility] to [facility]. Patient made appt to be seen

## 2017-01-01 NOTE — ED Triage Notes (Signed)
After getting pts vitals pt stated "he has high blood pressure and he is ok with his bp-168/112 and that he would go back and monitor it there."

## 2017-01-02 ENCOUNTER — Ambulatory Visit (INDEPENDENT_AMBULATORY_CARE_PROVIDER_SITE_OTHER): Payer: BLUE CROSS/BLUE SHIELD | Admitting: Family Medicine

## 2017-01-02 ENCOUNTER — Encounter: Payer: Self-pay | Admitting: Family Medicine

## 2017-01-02 VITALS — BP 173/111 | HR 81 | Temp 98.8°F | Resp 16 | Wt 214.0 lb

## 2017-01-02 DIAGNOSIS — G4489 Other headache syndrome: Secondary | ICD-10-CM | POA: Diagnosis not present

## 2017-01-02 DIAGNOSIS — I10 Essential (primary) hypertension: Secondary | ICD-10-CM | POA: Diagnosis not present

## 2017-01-02 MED ORDER — BUTALBITAL-ACETAMINOPHEN 50-300 MG PO TABS: ORAL_TABLET | ORAL | 0 refills | Status: DC

## 2017-01-02 MED ORDER — METOPROLOL SUCCINATE ER 100 MG PO TB24: 100.0000 mg | ORAL_TABLET | Freq: Every day | ORAL | 1 refills | Status: DC

## 2017-01-02 MED ORDER — HYDROCHLOROTHIAZIDE 25 MG PO TABS: 25.0000 mg | ORAL_TABLET | Freq: Every day | ORAL | 1 refills | Status: DC

## 2017-01-02 NOTE — Telephone Encounter (Signed)
Patient went to ER and has an appointment for 01/02/17.

## 2017-01-02 NOTE — Progress Notes (Signed)
Pre visit review using our clinic review tool, if applicable. No additional management support is needed unless otherwise documented below in the visit note. 

## 2017-01-02 NOTE — Patient Instructions (Signed)
Check bp and heart rate 1-2 times per day and write these numbers down so we can review them at f/u visit in 7-10 days.

## 2017-01-02 NOTE — Progress Notes (Signed)
OFFICE VISIT  01/02/2017   CC:  Chief Complaint  Patient presents with  . Headache    Elevated blood pressure   HPI:    Patient is [name] who presents for uncontrolled HTN, causing HAs. Started feeling more HA 5 d/a, started checking bp more regularly and noted it was 190-200 over 100-120.  He went to ED yesterday briefly, says he left after bp was checked (164/112).  He has been taking his bp meds.  Not exercising in last 2 weeks. No NSAID use, no otc decongestants.    Took an old cephadyne tab yesterday and it helped his HA some. HA 4/10 today.  He admits he is stressed out quite a bit about his bp lately and this could be adding to the elevated bp.  ROS: no vision complaints, no focal weakness, no CP, no SOB.  No vomiting.   Past Medical History:  Diagnosis Date  . Coronary artery disease    DES to RCA 2010 in setting of acute event; repeat cath 03/02/10 for chest pain showed widely patent stent and normal LV function.  . Diverticular disease    h/o 'itis  . Fatty liver   . Genital warts 06/23/2013  . History of pancreatitis   . History of peptic ulcer disease   . Hyperlipidemia   . Hypertension   . Insulin resistance 11/24/2016  . Metabolic syndrome   . Mood disorder (HCC)    per old records; anger and irritability + elevated libido--improved on sertraline.  . Prediabetes 11/28/2016  . Renal artery stenosis (HCC) 2011   Discordant data (mild bilat ostial disease on distal aortogram at time of cath, renal arteries appeared normal.  Then renal doppler u/s showed R>L RAS, renal sizes similar.  Recheck of renal artery dopplers 06/2013 showed no significant changes--repeat in 12 months recommended.  . Right flank pain    apparently had a trigger point b/c once the orthopedist injected the region he had no further pain.  (extensive w/u done for this problem prior to the injection, though).    Past Surgical History:  Procedure Laterality Date  .  COLONOSCOPY  2007   Normal per pt.  Recall 2017 (done in [place]  . CORONARY STENT PLACEMENT  09/14/09   RCA (Dr. Clarene DukeLittle)  . KNEE SURGERY  2007   Rt knee arthroscopy  . KNEE SURGERY  1983   Bilat knee debridement  . PALATE SURGERY  age 56 mo  . TYMPANOSTOMY TUBE PLACEMENT  1967    Outpatient Medications Prior to Visit  Medication Sig Dispense Refill  . aspirin 325 MG tablet Take 325 mg by mouth daily.    Marland Kitchen. atorvastatin (LIPITOR) 80 MG tablet Take 1 tablet (80 mg total) by mouth daily. 30 tablet 3  . losartan (COZAAR) 100 MG tablet TAKE 1 TABLET (100 MG TOTAL) BY MOUTH DAILY. 30 tablet 11  . metFORMIN (GLUCOPHAGE) 500 MG tablet Take 1 tablet (500 mg total) by mouth 2 (two) times daily with a meal. 60 tablet 3  . nitroGLYCERIN (NITROSTAT) 0.4 MG SL tablet Place 1 tablet (0.4 mg total) under the tongue every 5 (five) minutes as needed. 30 tablet 11  . metoprolol succinate (TOPROL-XL) 50 MG 24 hr tablet TAKE 1 TABLET (50 MG TOTAL) BY MOUTH DAILY. TAKE WITH OR IMMEDIATELY FOLLOWING A MEAL. 30 tablet 6   No facility-administered medications prior to visit.     Allergies  Allergen Reactions  . Penicillins Rash and Other (See  Comments)    Angio-edema    ROS As per HPI  PE: Blood pressure (!) 173/111, pulse 81, temperature 98.8 F (37.1 C), temperature source Oral, resp. rate 16, weight 214 lb (97.1 kg), SpO2 98 %. Gen: Alert, well appearing.  Patient is oriented to person, place, time, and situation. AFFECT: anxious, lucid thought and speech. ZOX:WRUE: no injection, icteris, swelling, or exudate.  EOMI, PERRLA. Mouth: lips without lesion/swelling.  Oral mucosa pink and moist. Oropharynx without erythema, exudate, or swelling.  Neck - No masses or thyromegaly or limitation in range of motion CV: RRR, no m/r/g.   LUNGS: CTA bilat, nonlabored resps, good aeration in all lung fields. EXT: no clubbing, cyanosis, or edema.   LABS:    Chemistry      Component Value Date/Time    NA 141 11/24/2016 1134   K 4.4 11/24/2016 1134   CL 104 11/24/2016 1134   CO2 30 11/24/2016 1134   BUN 16 11/24/2016 1134   CREATININE 1.14 11/24/2016 1134      Component Value Date/Time   CALCIUM 10.2 11/24/2016 1134   ALKPHOS 41 11/24/2016 1134   AST 17 11/24/2016 1134   ALT 20 11/24/2016 1134   BILITOT 1.6 (H) 11/24/2016 1134       IMPRESSION AND PLAN:  Uncontrolled HTN, causing HA's. Increase toprol xl to 100 mg qd.  If bp not <140/90 in the next 24-48h, then he'll start the HCTZ 25mg  I sent in today as well.   Hold ASA until bp controlled.  I rx'd bupap (butalbital 50/APAP 300 mg per tab), take 1-2 tabs q6h prn severe HA, #20, no RF. Instructions: Check bp and heart rate 1-2 times per day and write these numbers down so we can review them at f/u visit in 7-10 days.  An After Visit Summary was printed and given to the patient.  FOLLOW UP: Return 7-10d f/u HTN/get BMET.  Signed:  Santiago Bumpers, MD           01/02/2017

## 2017-01-10 ENCOUNTER — Encounter: Payer: Self-pay | Admitting: Family Medicine

## 2017-01-10 ENCOUNTER — Ambulatory Visit (INDEPENDENT_AMBULATORY_CARE_PROVIDER_SITE_OTHER): Payer: BLUE CROSS/BLUE SHIELD | Admitting: Family Medicine

## 2017-01-10 VITALS — BP 117/84 | HR 63 | Temp 98.2°F | Resp 16 | Wt 214.0 lb

## 2017-01-10 DIAGNOSIS — I10 Essential (primary) hypertension: Secondary | ICD-10-CM | POA: Diagnosis not present

## 2017-01-10 LAB — BASIC METABOLIC PANEL
BUN: 18 mg/dL (ref 6–23)
CALCIUM: 10.6 mg/dL — AB (ref 8.4–10.5)
CO2: 30 meq/L (ref 19–32)
Chloride: 101 mEq/L (ref 96–112)
Creatinine, Ser: 1.26 mg/dL (ref 0.40–1.50)
GFR: 62.51 mL/min (ref >60.00)
Glucose, Bld: 96 mg/dL (ref 70–99)
Potassium: 4.6 mEq/L (ref 3.5–5.1)
Sodium: 138 mEq/L (ref 135–145)

## 2017-01-10 MED ORDER — METFORMIN HCL 500 MG PO TABS: 500.0000 mg | ORAL_TABLET | Freq: Two times a day (BID) | ORAL | 3 refills | Status: DC

## 2017-01-10 MED ORDER — ATORVASTATIN CALCIUM 80 MG PO TABS: 80.0000 mg | ORAL_TABLET | Freq: Every day | ORAL | 3 refills | Status: DC

## 2017-01-10 MED ORDER — HYDROCHLOROTHIAZIDE 25 MG PO TABS: 25.0000 mg | ORAL_TABLET | Freq: Every day | ORAL | 3 refills | Status: DC

## 2017-01-10 MED ORDER — LOSARTAN POTASSIUM 100 MG PO TABS: ORAL_TABLET | ORAL | 3 refills | Status: DC

## 2017-01-10 MED ORDER — METOPROLOL SUCCINATE ER 100 MG PO TB24: 100.0000 mg | ORAL_TABLET | Freq: Every day | ORAL | 3 refills | Status: DC

## 2017-01-10 NOTE — Progress Notes (Signed)
OFFICE VISIT  01/10/2017   CC:  Chief Complaint  Patient presents with  . Follow-up    HTN   HPI:    Patient is [name] who presents for 8d f/u uncontrolled HTN. I increased his toprol xl, also gave instructions to start hctz 25mg  qd if bp not <140/90 in 2d. Feeling MUCH better.  Home bps 130s to lower 140s, diast 80s, HR 75 avg.   Past Medical History:  Diagnosis Date  . Coronary artery disease    DES to RCA 2010 in setting of acute event; repeat cath 03/02/10 for chest pain showed widely patent stent and normal LV function.  . Diverticular disease    h/o 'itis  . Fatty liver   . Genital warts 06/23/2013  . History of pancreatitis   . History of peptic ulcer disease   . Hyperlipidemia   . Hypertension   . Insulin resistance 11/24/2016  . Metabolic syndrome   . Mood disorder (HCC)    per old records; anger and irritability + elevated libido--improved on sertraline.  . Prediabetes 11/28/2016  . Renal artery stenosis (HCC) 2011   Discordant data (mild bilat ostial disease on distal aortogram at time of cath, renal arteries appeared normal.  Then renal doppler u/s showed R>L RAS, renal sizes similar.  Recheck of renal artery dopplers 06/2013 showed no significant changes--repeat in 12 months recommended.  . Right flank pain    apparently had a trigger point b/c once the orthopedist injected the region he had no further pain.  (extensive w/u done for this problem prior to the injection, though).    Past Surgical History:  Procedure Laterality Date  . COLONOSCOPY  2007   Normal per pt.  Recall 2017 ([place]  . CORONARY STENT PLACEMENT  09/14/09   RCA (Dr. Clarene DukeLittle)  . KNEE SURGERY  2007   Rt knee arthroscopy  . KNEE SURGERY  1983   Bilat knee debridement  . PALATE SURGERY  age 56 mo  . TYMPANOSTOMY TUBE PLACEMENT  1967    Outpatient Medications Prior to Visit  Medication Sig Dispense Refill  . Butalbital-Acetaminophen 50-300 MG TABS 1-2  tabs po q6h prn HA 20 tablet 0  . nitroGLYCERIN (NITROSTAT) 0.4 MG SL tablet Place 1 tablet (0.4 mg total) under the tongue every 5 (five) minutes as needed. 30 tablet 11  . atorvastatin (LIPITOR) 80 MG tablet Take 1 tablet (80 mg total) by mouth daily. 30 tablet 3  . hydrochlorothiazide (HYDRODIURIL) 25 MG tablet Take 1 tablet (25 mg total) by mouth daily. 30 tablet 1  . losartan (COZAAR) 100 MG tablet TAKE 1 TABLET (100 MG TOTAL) BY MOUTH DAILY. 30 tablet 11  . metFORMIN (GLUCOPHAGE) 500 MG tablet Take 1 tablet (500 mg total) by mouth 2 (two) times daily with a meal. 60 tablet 3  . metoprolol succinate (TOPROL-XL) 100 MG 24 hr tablet Take 1 tablet (100 mg total) by mouth daily. Take with or immediately following a meal. 30 tablet 1  . aspirin 325 MG tablet Take 325 mg by mouth daily.     No facility-administered medications prior to visit.     Allergies  Allergen Reactions  . Penicillins Rash and Other (See Comments)    Angio-edema    ROS As per HPI  PE: Blood pressure 117/84, pulse 63, temperature 98.2 F (36.8 C), temperature source Temporal, resp. rate 16, weight 214 lb (97.1 kg), SpO2 96 %. Gen: Alert, well appearing.  Patient  is oriented to person, place, time, and situation. AFFECT: pleasant, lucid thought and speech. CV: RRR, no m/r/g.   LUNGS: CTA bilat, nonlabored resps, good aeration in all lung fields. EXT: no clubbing, cyanosis, or edema.   LABS:    Chemistry      Component Value Date/Time   NA 141 11/24/2016 1134   K 4.4 11/24/2016 1134   CL 104 11/24/2016 1134   CO2 30 11/24/2016 1134   BUN 16 11/24/2016 1134   CREATININE 1.14 11/24/2016 1134      Component Value Date/Time   CALCIUM 10.2 11/24/2016 1134   ALKPHOS 41 11/24/2016 1134   AST 17 11/24/2016 1134   ALT 20 11/24/2016 1134   BILITOT 1.6 (H) 11/24/2016 1134     Lab Results  Component Value Date   HGBA1C 6.3 11/24/2016   IMPRESSION AND PLAN:  HTN, controlled now. The current medical  regimen is effective;  continue present plan and medications. He can restart his daily aspirin now. Check BMET today.  An After Visit Summary was printed and given to the patient.  FOLLOW UP: Return in about 4 months (around 05/10/2017) for routine chronic illness f/u.  Signed:  Santiago Bumpers, MD           01/10/2017

## 2017-02-24 DIAGNOSIS — K645 Perianal venous thrombosis: Secondary | ICD-10-CM | POA: Diagnosis not present

## 2017-04-04 ENCOUNTER — Ambulatory Visit (INDEPENDENT_AMBULATORY_CARE_PROVIDER_SITE_OTHER): Payer: BLUE CROSS/BLUE SHIELD | Admitting: Family Medicine

## 2017-04-04 ENCOUNTER — Encounter: Payer: Self-pay | Admitting: Family Medicine

## 2017-04-04 VITALS — BP 164/77 | HR 53 | Temp 98.5°F | Resp 16 | Ht 67.0 in | Wt 204.8 lb

## 2017-04-04 DIAGNOSIS — K644 Residual hemorrhoidal skin tags: Secondary | ICD-10-CM

## 2017-04-04 MED ORDER — HYDROCORTISONE 2.5 % RE CREA: 1.0000 "application " | TOPICAL_CREAM | Freq: Two times a day (BID) | RECTAL | 2 refills | Status: DC

## 2017-04-04 NOTE — Progress Notes (Signed)
Pre visit review using our clinic review tool, if applicable. No additional management support is needed unless otherwise documented below in the visit note. 

## 2017-04-04 NOTE — Progress Notes (Signed)
OFFICE VISIT  04/04/2017   CC:  Chief Complaint  Patient presents with  . Hemorrhoids    seen at [facility] urgent care   HPI:    Patient is [name] who presents for hemorrhoid pain. Over the last month he has started getting constipation, started feeling hemorrhoids.   Got a thrombosed hemorrhoid opened up 3 weeks ago at [facility] urgent clinic. Using prep H, was using anusol but ran out.  Is soaking daily to bid and using tylenol. Now is taking stool softener and hydrating better and stools are now soft and easy to pass, but hurts to have BM.  No bleeding.  Past Medical History:  Diagnosis Date  . Coronary artery disease    DES to RCA 2010 in setting of acute event; repeat cath 03/02/10 for chest pain showed widely patent stent and normal LV function.  . Diverticular disease    h/o 'itis  . Fatty liver   . Genital warts 06/23/2013  . History of pancreatitis   . History of peptic ulcer disease   . Hyperlipidemia   . Hypertension   . Insulin resistance 11/24/2016  . Metabolic syndrome   . Mood disorder (HCC)    per old records; anger and irritability + elevated libido--improved on sertraline.  . Prediabetes 11/28/2016  . Renal artery stenosis (HCC) 2011   Discordant data (mild bilat ostial disease on distal aortogram at time of cath, renal arteries appeared normal.  Then renal doppler u/s showed R>L RAS, renal sizes similar.  Recheck of renal artery dopplers 06/2013 showed no significant changes--repeat in 12 months recommended.  . Right flank pain    apparently had a trigger point b/c once the orthopedist injected the region he had no further pain.  (extensive w/u done for this problem prior to the injection, though).    Past Surgical History:  Procedure Laterality Date  . COLONOSCOPY  2007   Normal per pt.  Recall 2017 (done in [place], [place]).  . CORONARY STENT PLACEMENT  09/14/09   RCA (Dr. Clarene Duke)  . KNEE SURGERY  2007   Rt knee arthroscopy  . KNEE SURGERY   1983   Bilat knee debridement  . PALATE SURGERY  age 78 mo  . TYMPANOSTOMY TUBE PLACEMENT  1967    Outpatient Medications Prior to Visit  Medication Sig Dispense Refill  . aspirin 325 MG tablet Take 325 mg by mouth daily.    Marland Kitchen atorvastatin (LIPITOR) 80 MG tablet Take 1 tablet (80 mg total) by mouth daily. 90 tablet 3  . hydrochlorothiazide (HYDRODIURIL) 25 MG tablet Take 1 tablet (25 mg total) by mouth daily. 90 tablet 3  . losartan (COZAAR) 100 MG tablet TAKE 1 TABLET (100 MG TOTAL) BY MOUTH DAILY. 90 tablet 3  . metFORMIN (GLUCOPHAGE) 500 MG tablet Take 1 tablet (500 mg total) by mouth 2 (two) times daily with a meal. 180 tablet 3  . metoprolol succinate (TOPROL-XL) 100 MG 24 hr tablet Take 1 tablet (100 mg total) by mouth daily. Take with or immediately following a meal. 90 tablet 3  . nitroGLYCERIN (NITROSTAT) 0.4 MG SL tablet Place 1 tablet (0.4 mg total) under the tongue every 5 (five) minutes as needed. 30 tablet 11  . Butalbital-Acetaminophen 50-300 MG TABS 1-2 tabs po q6h prn HA (Patient not taking: Reported on 04/04/2017) 20 tablet 0   No facility-administered medications prior to visit.     Allergies  Allergen Reactions  . Penicillins Rash and Other (See Comments)  Angio-edema    ROS As per HPI  PE: Blood pressure (!) 164/77, pulse (!) 53, temperature 98.5 F (36.9 C), temperature source Oral, resp. rate 16, height  (1.702 m), weight 204 lb 12 oz (92.9 kg), SpO2 98 %. Gen: Alert, well appearing.  Patient is oriented to person, place, time, and situation. AFFECT: pleasant, lucid thought and speech. Rectal: a few fibrous tags around anus, no palpable external hemorrhoid, no fissure.  He is moderately TTP around superior aspect and left lateral aspect of external anal opening.  VEry tender on trial of digital rectal exam so this was not fully completed. No bleeding.  LABS:  none  IMPRESSION AND PLAN:  Inflamed external hemorrhoids.  These are very small. One  was recently thrombosed and thrombectomy was done at [facility] UC clinic.  No sign of thrombosed hemorrhoid today. Anusol HC 2.5% cream, apply bid prn. Continue qd-bid soaks. Continue constipation regimen.  An After Visit Summary was printed and given to the patient.  FOLLOW UP: Return if symptoms worsen or fail to improve.  Signed:  Santiago Bumpers, MD           04/04/2017

## 2017-05-03 ENCOUNTER — Ambulatory Visit (INDEPENDENT_AMBULATORY_CARE_PROVIDER_SITE_OTHER): Payer: BLUE CROSS/BLUE SHIELD | Admitting: Family Medicine

## 2017-05-03 ENCOUNTER — Encounter: Payer: Self-pay | Admitting: Family Medicine

## 2017-05-03 VITALS — BP 133/85 | HR 76 | Temp 98.4°F | Resp 16 | Ht 67.0 in | Wt 197.5 lb

## 2017-05-03 DIAGNOSIS — R1900 Intra-abdominal and pelvic swelling, mass and lump, unspecified site: Secondary | ICD-10-CM | POA: Diagnosis not present

## 2017-05-03 DIAGNOSIS — R1032 Left lower quadrant pain: Secondary | ICD-10-CM | POA: Diagnosis not present

## 2017-05-03 DIAGNOSIS — F341 Dysthymic disorder: Secondary | ICD-10-CM

## 2017-05-03 LAB — CBC WITH DIFFERENTIAL/PLATELET
BASOS PCT: 0.8 % (ref 0.0–3.0)
Basophils Absolute: 0.1 10*3/uL (ref 0.0–0.1)
EOS ABS: 0.2 10*3/uL (ref 0.0–0.7)
Eosinophils Relative: 2.7 % (ref 0.0–5.0)
HEMATOCRIT: 42.4 % (ref 39.0–52.0)
HEMOGLOBIN: 14.4 g/dL (ref 13.0–17.0)
LYMPHS PCT: 13.5 % (ref 12.0–46.0)
Lymphs Abs: 1.2 10*3/uL (ref 0.7–4.0)
MCHC: 33.9 g/dL (ref 30.0–36.0)
MCV: 86.4 fl (ref 78.0–100.0)
Monocytes Absolute: 0.7 10*3/uL (ref 0.1–1.0)
Monocytes Relative: 8.3 % (ref 3.0–12.0)
NEUTROS ABS: 6.7 10*3/uL (ref 1.4–7.7)
Neutrophils Relative %: 74.7 % (ref 43.0–77.0)
PLATELETS: 196 10*3/uL (ref 150.0–400.0)
RBC: 4.9 Mil/uL (ref 4.22–5.81)
RDW: 13.1 % (ref 11.5–15.5)
WBC: 8.9 10*3/uL (ref 4.0–10.5)

## 2017-05-03 LAB — COMPREHENSIVE METABOLIC PANEL
ALBUMIN: 4.8 g/dL (ref 3.5–5.2)
ALT: 18 U/L (ref 0–53)
AST: 16 U/L (ref 0–37)
Alkaline Phosphatase: 38 U/L — ABNORMAL LOW (ref 39–117)
BILIRUBIN TOTAL: 2 mg/dL — AB (ref 0.2–1.2)
BUN: 16 mg/dL (ref 6–23)
CALCIUM: 10 mg/dL (ref 8.4–10.5)
CHLORIDE: 98 meq/L (ref 96–112)
CO2: 31 meq/L (ref 19–32)
CREATININE: 1.14 mg/dL (ref 0.40–1.50)
GFR: 70.09 mL/min (ref >60.00)
Glucose, Bld: 110 mg/dL — ABNORMAL HIGH (ref 70–99)
Potassium: 3.7 mEq/L (ref 3.5–5.1)
Sodium: 137 mEq/L (ref 135–145)
Total Protein: 7.7 g/dL (ref 6.0–8.3)

## 2017-05-03 LAB — URINALYSIS, ROUTINE W REFLEX MICROSCOPIC
BILIRUBIN URINE: NEGATIVE
HGB URINE DIPSTICK: NEGATIVE
Ketones, ur: NEGATIVE
LEUKOCYTES UA: NEGATIVE
Nitrite: NEGATIVE
PH: 6 (ref 5.0–8.0)
RBC / HPF: NONE SEEN (ref 0–?)
Specific Gravity, Urine: 1.015 (ref 1.000–1.030)
TOTAL PROTEIN, URINE-UPE24: NEGATIVE
UROBILINOGEN UA: 0.2 (ref 0.0–1.0)
Urine Glucose: NEGATIVE
WBC UA: NONE SEEN (ref 0–?)

## 2017-05-03 LAB — C-REACTIVE PROTEIN: CRP: 4.3 mg/dL (ref 0.5–20.0)

## 2017-05-03 LAB — SEDIMENTATION RATE: SED RATE: 58 mm/h — AB (ref 0–20)

## 2017-05-03 LAB — LIPASE: LIPASE: 18 U/L (ref 11.0–59.0)
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
## 2017-05-06 ENCOUNTER — Ambulatory Visit (HOSPITAL_BASED_OUTPATIENT_CLINIC_OR_DEPARTMENT_OTHER)
Admission: RE | Admit: 2017-05-06 | Discharge: 2017-05-06 | Disposition: A | Discharge status: Home / Self Care | Payer: BLUE CROSS/BLUE SHIELD | Source: Ambulatory Visit | Attending: Family Medicine | Admitting: Family Medicine

## 2017-05-06 DIAGNOSIS — K5732 Diverticulitis of large intestine without perforation or abscess without bleeding: Secondary | ICD-10-CM | POA: Diagnosis not present

## 2017-05-06 DIAGNOSIS — R1032 Left lower quadrant pain: Secondary | ICD-10-CM | POA: Insufficient documentation

## 2017-05-06 DIAGNOSIS — R1900 Intra-abdominal and pelvic swelling, mass and lump, unspecified site: Secondary | ICD-10-CM | POA: Diagnosis not present

## 2017-05-06 MED ORDER — IOPAMIDOL (ISOVUE-300) INJECTION 61%
100.0000 mL | Freq: Once | INTRAVENOUS | Status: AC | PRN
  Administered 2017-05-06: 100 mL via INTRAVENOUS

## 2017-05-07 ENCOUNTER — Other Ambulatory Visit: Payer: Self-pay | Admitting: Family Medicine

## 2017-05-07 DIAGNOSIS — K5732 Diverticulitis of large intestine without perforation or abscess without bleeding: Secondary | ICD-10-CM | POA: Diagnosis not present

## 2017-05-07 MED ORDER — CIPROFLOXACIN HCL 500 MG PO TABS: 500.0000 mg | ORAL_TABLET | Freq: Two times a day (BID) | ORAL | 0 refills | Status: AC

## 2017-05-07 MED ORDER — METRONIDAZOLE 500 MG PO TABS: 500.0000 mg | ORAL_TABLET | Freq: Three times a day (TID) | ORAL | 0 refills | Status: AC

## 2017-05-09 ENCOUNTER — Ambulatory Visit: Payer: BLUE CROSS/BLUE SHIELD | Admitting: Family Medicine

## 2017-05-28 ENCOUNTER — Other Ambulatory Visit: Payer: Self-pay | Admitting: Family Medicine

## 2017-08-20 ENCOUNTER — Ambulatory Visit (INDEPENDENT_AMBULATORY_CARE_PROVIDER_SITE_OTHER): Payer: BLUE CROSS/BLUE SHIELD | Admitting: Family Medicine

## 2017-08-20 ENCOUNTER — Encounter: Payer: Self-pay | Admitting: Family Medicine

## 2017-08-20 VITALS — BP 127/84 | HR 80 | Temp 99.2°F | Resp 16 | Ht 67.0 in | Wt 209.0 lb

## 2017-08-20 DIAGNOSIS — J0100 Acute maxillary sinusitis, unspecified: Secondary | ICD-10-CM | POA: Diagnosis not present

## 2017-08-20 MED ORDER — CLINDAMYCIN HCL 300 MG PO CAPS: 300.0000 mg | ORAL_CAPSULE | Freq: Three times a day (TID) | ORAL | 0 refills | Status: DC

## 2017-08-20 NOTE — Patient Instructions (Signed)
Get otc generic robitussin DM OR Mucinex DM and use as directed on the packaging for cough and congestion. Use otc generic saline nasal spray 2-3 times per day to irrigate/moisturize your nasal passages.  Also, get a generic over the counter nasal steroid spray (flonase or nasacort, or rhinocort) and take as directed on the packaging for 2 weeks.

## 2017-08-20 NOTE — Progress Notes (Signed)
OFFICE VISIT  08/20/2017   CC:  Chief Complaint  Patient presents with  . URI  . Sinusitis   HPI:    Patient is a 55 y.o. Caucasian male who presents for respiratory symptoms. Onset about 5d ago with lots of nasal mucous, pressure in face, HA, slight ST from PMD. Slight cough.  Fatigue.  No fever.  No wheezing or SOB. Robitussin DM + phenylephrine.  He has gotten worse the last 1-2 days. No nasal sprays used.  Past Medical History:  Diagnosis Date  . Coronary artery disease    DES to RCA 2010 in setting of acute event; repeat cath 03/02/10 for chest pain showed widely patent stent and normal LV function.  . Diverticular disease    h/o 'itis  . Fatty liver   . Genital warts 06/23/2013  . History of pancreatitis   . History of peptic ulcer disease   . Hyperlipidemia   . Hypertension   . Insulin resistance 11/24/2016  . Metabolic syndrome   . Mood disorder (HCC)    per old records; anger and irritability + elevated libido--improved on sertraline.  . Prediabetes 11/28/2016  . Renal artery stenosis (HCC) 2011   Discordant data (mild bilat ostial disease on distal aortogram at time of cath, renal arteries appeared normal.  Then renal doppler u/s showed R>L RAS, renal sizes similar.  Recheck of renal artery dopplers 06/2013 showed no significant changes--repeat in 12 months recommended.  . Right flank pain    apparently had a trigger point b/c once the orthopedist injected the region he had no further pain.  (extensive w/u done for this problem prior to the injection, though).    Past Surgical History:  Procedure Laterality Date  . COLONOSCOPY  2007   Normal per pt.  Recall 2017 (done in [place], [place]).  . CORONARY STENT PLACEMENT  09/14/09   RCA (Dr. Clarene Duke)  . KNEE SURGERY  2007   Rt knee arthroscopy  . KNEE SURGERY  1983   Bilat knee debridement  . PALATE SURGERY  age 60 mo  . TYMPANOSTOMY TUBE PLACEMENT  1967    Outpatient Medications Prior to Visit  Medication Sig  Dispense Refill  . aspirin 325 MG tablet Take 325 mg by mouth daily.    Marland Kitchen atorvastatin (LIPITOR) 80 MG tablet Take 1 tablet (80 mg total) by mouth daily. 90 tablet 3  . hydrochlorothiazide (HYDRODIURIL) 25 MG tablet Take 1 tablet (25 mg total) by mouth daily. 90 tablet 3  . hydrocortisone (ANUSOL-HC) 2.5 % rectal cream Place 1 application rectally 2 (two) times daily. 30 g 2  . losartan (COZAAR) 100 MG tablet TAKE 1 TABLET (100 MG TOTAL) BY MOUTH DAILY. 90 tablet 3  . metFORMIN (GLUCOPHAGE) 500 MG tablet Take 1 tablet (500 mg total) by mouth 2 (two) times daily with a meal. 180 tablet 3  . metoprolol succinate (TOPROL-XL) 100 MG 24 hr tablet TAKE 1 TABLET (100 MG TOTAL) BY MOUTH DAILY. TAKE WITH OR IMMEDIATELY FOLLOWING A MEAL. 90 tablet 0  . nitroGLYCERIN (NITROSTAT) 0.4 MG SL tablet Place 1 tablet (0.4 mg total) under the tongue every 5 (five) minutes as needed. 30 tablet 11  . docusate sodium (COLACE) 100 MG capsule Take 100 mg by mouth daily.     No facility-administered medications prior to visit.     Allergies  Allergen Reactions  . Tramadol     Per pt: legs kick, trouble coming off  . Penicillins Rash and Other (See Comments)  Angio-edema    ROS As per HPI  PE: Blood pressure 127/84, pulse 80, temperature 99.2 F (37.3 C), temperature source Oral, resp. rate 16, height 5\' 7"  (1.702 m), weight 209 lb (94.8 kg), SpO2 99 %. VS: noted--normal. Gen: alert, NAD, NONTOXIC APPEARING. HEENT: eyes without injection, drainage, or swelling.  Ears: EACs clear, TMs with normal light reflex and landmarks.  Nose: Clear rhinorrhea, with some dried, crusty exudate adherent to mildly injected mucosa.  No purulent d/c.  No paranasal sinus TTP.  No facial swelling.  Throat and mouth without focal lesion.  No pharyngial swelling, erythema, or exudate.   Neck: supple, no LAD.   LUNGS: CTA bilat, nonlabored resps.   CV: RRR, no m/r/g. EXT: no c/c/e SKIN: no rash  LABS:    Chemistry       Component Value Date/Time   NA 137 05/03/2017 1015   K 3.7 05/03/2017 1015   CL 98 05/03/2017 1015   CO2 31 05/03/2017 1015   BUN 16 05/03/2017 1015   CREATININE 1.14 05/03/2017 1015      Component Value Date/Time   CALCIUM 10.0 05/03/2017 1015   ALKPHOS 38 (L) 05/03/2017 1015   AST 16 05/03/2017 1015   ALT 18 05/03/2017 1015   BILITOT 2.0 (H) 05/03/2017 1015     Lab Results  Component Value Date   HGBA1C 6.3 11/24/2016   Lab Results  Component Value Date   CHOL 193 11/24/2016   HDL 39.30 11/24/2016   LDLCALC 119 (H) 11/24/2016   LDLDIRECT 115.2 07/09/2014   TRIG 171.0 (H) 11/24/2016   CHOLHDL 5 11/24/2016    IMPRESSION AND PLAN:  Acute sinusitis: Clindamycin 300 mg tid x 10d. Get otc generic robitussin DM OR Mucinex DM and use as directed on the packaging for cough and congestion. Use otc generic saline nasal spray 2-3 times per day to irrigate/moisturize your nasal passages. Take otc nasal steroid spray as directed on packaging x 2 weeks.  An After Visit Summary was printed and given to the patient.  FOLLOW UP: Return if symptoms worsen or fail to improve.  Signed:  Santiago BumpersPhil Lexiana Spindel, MD           08/20/2017

## 2017-08-24 ENCOUNTER — Other Ambulatory Visit: Payer: Self-pay | Admitting: Family Medicine

## 2017-12-11 DIAGNOSIS — E119 Type 2 diabetes mellitus without complications: Secondary | ICD-10-CM

## 2017-12-11 HISTORY — DX: Type 2 diabetes mellitus without complications: E11.9

## 2018-01-14 ENCOUNTER — Other Ambulatory Visit: Payer: Self-pay | Admitting: Family Medicine

## 2018-01-14 NOTE — Telephone Encounter (Signed)
Left message for pt to call back.   Okay for [facility] to advise pt and schedule apt. 

## 2018-01-14 NOTE — Telephone Encounter (Signed)
Pt is overdue for f/u [facility]CI, will send Rx's for 90 day supply, pt will need to be seen before this runs out.

## 2018-01-18 NOTE — Telephone Encounter (Signed)
Pt advised and voiced understanding. He stated that he will call back to schedule either a physical or routine follow up.

## 2018-01-31 ENCOUNTER — Other Ambulatory Visit: Payer: Self-pay | Admitting: Family Medicine

## 2018-02-04 ENCOUNTER — Telehealth: Payer: Self-pay | Admitting: Family Medicine

## 2018-02-04 MED ORDER — LORAZEPAM 1 MG PO TABS: ORAL_TABLET | ORAL | 1 refills | Status: DC

## 2018-02-04 NOTE — Telephone Encounter (Signed)
Please advise. Thanks.  

## 2018-02-04 NOTE — Telephone Encounter (Signed)
Copied from CRM [id number]. Topic: Quick Communication - See Telephone Encounter >> Feb 04, 2018 11:10 AM Oneal GroutSebastian, Jennifer S wrote: CRM for notification. See Telephone encounter for: Has dental cleaning on 02/06/18, needs something for anxiety called in[facility] [place].  BP always goes up at dentist office due to anxiety.  02/04/18.

## 2018-02-04 NOTE — Telephone Encounter (Signed)
OK, lorazepam rx printed.

## 2018-02-05 NOTE — Telephone Encounter (Signed)
Called into [facility]

## 2018-02-14 ENCOUNTER — Encounter: Payer: Self-pay | Admitting: Family Medicine

## 2018-02-14 ENCOUNTER — Ambulatory Visit (INDEPENDENT_AMBULATORY_CARE_PROVIDER_SITE_OTHER): Payer: BLUE CROSS/BLUE SHIELD | Admitting: Family Medicine

## 2018-02-14 VITALS — BP 156/80 | HR 65 | Temp 98.2°F | Resp 16 | Ht 67.0 in | Wt 209.0 lb

## 2018-02-14 DIAGNOSIS — Z021 Encounter for pre-employment examination: Secondary | ICD-10-CM

## 2018-02-14 DIAGNOSIS — E119 Type 2 diabetes mellitus without complications: Secondary | ICD-10-CM

## 2018-02-14 DIAGNOSIS — I10 Essential (primary) hypertension: Secondary | ICD-10-CM

## 2018-02-14 DIAGNOSIS — Z23 Encounter for immunization: Secondary | ICD-10-CM | POA: Diagnosis not present

## 2018-02-14 DIAGNOSIS — E7800 Pure hypercholesterolemia, unspecified: Secondary | ICD-10-CM | POA: Diagnosis not present

## 2018-02-14 LAB — COMPREHENSIVE METABOLIC PANEL
ALT: 23 U/L (ref 0–53)
AST: 16 U/L (ref 0–37)
Albumin: 4.6 g/dL (ref 3.5–5.2)
Alkaline Phosphatase: 32 U/L — ABNORMAL LOW (ref 39–117)
BUN: 16 mg/dL (ref 6–23)
CHLORIDE: 102 meq/L (ref 96–112)
CO2: 32 meq/L (ref 19–32)
Calcium: 10.8 mg/dL — ABNORMAL HIGH (ref 8.4–10.5)
Creatinine, Ser: 1.16 mg/dL (ref 0.40–1.50)
GFR: 68.51 mL/min (ref >60.00)
GLUCOSE: 109 mg/dL — AB (ref 70–99)
POTASSIUM: 4.7 meq/L (ref 3.5–5.1)
SODIUM: 141 meq/L (ref 135–145)
Total Bilirubin: 1.2 mg/dL (ref 0.2–1.2)
Total Protein: 7.5 g/dL (ref 6.0–8.3)

## 2018-02-14 LAB — LIPID PANEL
CHOL/HDL RATIO: 2
Cholesterol: 137 mg/dL (ref 0–200)
HDL: 55.1 mg/dL (ref >39.00)
LDL Cholesterol: 50 mg/dL (ref 0–99)
NONHDL: 81.74
Triglycerides: 160 mg/dL — ABNORMAL HIGH (ref 0.0–149.0)
VLDL: 32 mg/dL (ref 0.0–40.0)

## 2018-02-14 LAB — HEMOGLOBIN A1C: HEMOGLOBIN A1C: 6.2 % (ref 4.6–6.5)

## 2018-02-14 NOTE — Progress Notes (Signed)
OFFICE VISIT  02/14/2018   CC:  Chief Complaint  Patient presents with  . Follow-up    RCI    HPI:    Patient is a 57 y.o. Caucasian male who presents for f/u DM 2, HTN, HLD.  It has been about a year since he has followed up for these chronic problems. Has applied for a job with [employer], asks for UDS today as pre-employment prep. He is not taking any controlled substances regularly, but says he took a lorazepam rx'd by me for a dental procedure--last dose 4 d/a.  Nothing else should show up.  HTN: home bp monitoring consistently 120s/70s.  Playing tennis 3 X/week. Wt is stable.  DM2: no home glucose monitoring.  Taking glucophage 500 mg bid. Avoids simple sugars.  HLD: taking statin daily, no side effects.  Past Medical History:  Diagnosis Date  . Coronary artery disease    DES to RCA 2010 in setting of acute event; repeat cath 03/02/10 for chest pain showed widely patent stent and normal LV function.  . Diverticular disease    h/o 'itis  . Fatty liver   . Genital warts 06/23/2013  . History of pancreatitis   . History of peptic ulcer disease   . Hyperlipidemia   . Hypertension   . Insulin resistance 11/24/2016  . Metabolic syndrome   . Mood disorder (HCC)    per old records; anger and irritability + elevated libido--improved on sertraline.  . Prediabetes 11/28/2016  . Renal artery stenosis (HCC) 2011   Discordant data (mild bilat ostial disease on distal aortogram at time of cath, renal arteries appeared normal.  Then renal doppler u/s showed R>L RAS, renal sizes similar.  Recheck of renal artery dopplers 06/2013 showed no significant changes--repeat in 12 months recommended.  . Right flank pain    apparently had a trigger point b/c once the orthopedist injected the region he had no further pain.  (extensive w/u done for this problem prior to the injection, though).    Past Surgical History:  Procedure Laterality Date  . COLONOSCOPY  2007   Normal per pt.  Recall  2017 (done in [place], [place]).  . CORONARY STENT PLACEMENT  09/14/09   RCA (Dr. Clarene Duke)  . KNEE SURGERY  2007   Rt knee arthroscopy  . KNEE SURGERY  1983   Bilat knee debridement  . PALATE SURGERY  age 56 mo  . TYMPANOSTOMY TUBE PLACEMENT  1967    Outpatient Medications Prior to Visit  Medication Sig Dispense Refill  . aspirin 325 MG tablet Take 325 mg by mouth daily.    Marland Kitchen atorvastatin (LIPITOR) 80 MG tablet Take 1 tablet (80 mg total) by mouth daily. 90 tablet 3  . azithromycin (ZITHROMAX) 250 MG tablet TAKE 2 TABLETS BY MOUTH TODAY, THEN TAKE 1 TABLET DAILY FOR 4 DAYS  0  . hydrochlorothiazide (HYDRODIURIL) 25 MG tablet TAKE 1 TABLET (25 MG TOTAL) BY MOUTH DAILY. 90 tablet 0  . hydrocortisone (ANUSOL-HC) 2.5 % rectal cream Place 1 application rectally 2 (two) times daily. 30 g 2  . losartan (COZAAR) 100 MG tablet TAKE 1 TABLET (100 MG TOTAL) BY MOUTH DAILY. 90 tablet 0  . metFORMIN (GLUCOPHAGE) 500 MG tablet TAKE 1 TABLET (500 MG TOTAL) BY MOUTH 2 (TWO) TIMES DAILY WITH A MEAL. 180 tablet 0  . metoprolol succinate (TOPROL-XL) 100 MG 24 hr tablet TAKE 1 TABLET (100 MG TOTAL) BY MOUTH DAILY. TAKE WITH OR IMMEDIATELY FOLLOWING A MEAL. 90 tablet 1  .  nitroGLYCERIN (NITROSTAT) 0.4 MG SL tablet Place 1 tablet (0.4 mg total) under the tongue every 5 (five) minutes as needed. 30 tablet 11  . clindamycin (CLEOCIN) 300 MG capsule Take 1 capsule (300 mg total) by mouth 3 (three) times daily. 30 capsule 0  . LORazepam (ATIVAN) 1 MG tablet 1 tab po 1 hour prior to dentist visit 2 tablet 1   No facility-administered medications prior to visit.     Allergies  Allergen Reactions  . Tramadol     Per pt: legs kick, trouble coming off  . Penicillins Rash and Other (See Comments)    Angio-edema    ROS As per HPI  PE: Blood pressure (!) 156/80, pulse 65, temperature 98.2 F (36.8 C), temperature source Temporal, resp. rate 16, height 5\' 7"  (1.702 m), weight 209 lb (94.8 kg), SpO2 96 %. Initial bp  today was 159/88. Gen: Alert, well appearing.  Patient is oriented to person, place, time, and situation. AFFECT: pleasant, lucid thought and speech. CV: RRR, no m/r/g.   LUNGS: CTA bilat, nonlabored resps, good aeration in all lung fields. EXT: no clubbing, cyanosis, or edema.  Foot exam - bilateral normal; no swelling, tenderness or skin or vascular lesions. Color and temperature is normal. Sensation is intact. Peripheral pulses are palpable. Toenails are normal.   LABS:   Lab Results  Component Value Date   WBC 8.9 05/03/2017   HGB 14.4 05/03/2017   HCT 42.4 05/03/2017   MCV 86.4 05/03/2017   PLT 196.0 05/03/2017   Lab Results  Component Value Date   CREATININE 1.14 05/03/2017   BUN 16 05/03/2017   NA 137 05/03/2017   K 3.7 05/03/2017   CL 98 05/03/2017   CO2 31 05/03/2017   Lab Results  Component Value Date   ALT 18 05/03/2017   AST 16 05/03/2017   ALKPHOS 38 (L) 05/03/2017   BILITOT 2.0 (H) 05/03/2017   Lab Results  Component Value Date   CHOL 193 11/24/2016   Lab Results  Component Value Date   HDL 39.30 11/24/2016   Lab Results  Component Value Date   LDLCALC 119 (H) 11/24/2016   Lab Results  Component Value Date   TRIG 171.0 (H) 11/24/2016   Lab Results  Component Value Date   CHOLHDL 5 11/24/2016   Lab Results  Component Value Date   PSA 0.36 07/09/2014   Lab Results  Component Value Date   HGBA1C 6.3 11/24/2016    IMPRESSION AND PLAN:  1) DM 2; HbA1c today. Feet exam normal today. Reminded pt of eye exam being due. Flu vaccine today as well as lytes/cr.  2) HTN: good control as per home monitoring. Check cr/lytes today.  3) HLD: tolerating statin.  FLP and hepatic panel today.  4) Pre-employment UDS: requested by pt.  Lorazepam was taken 4 d/a for just one time use for dental procedure.  An After Visit Summary was printed and given to the patient.  FOLLOW UP: Return in about 3 months (around 05/17/2018) for annual CPE  (fasting).  Signed:  Santiago BumpersPhil Felisa Zechman, MD           02/14/2018

## 2018-02-18 LAB — PAIN MGMT, PROFILE 8 W/CONF, U
6 Acetylmorphine: NEGATIVE ng/mL (ref <10)
ALCOHOL METABOLITES: NEGATIVE ng/mL (ref <500)
ALPHAHYDROXYTRIAZOLAM: NEGATIVE ng/mL (ref <50)
AMPHETAMINES: NEGATIVE ng/mL (ref <500)
Alphahydroxyalprazolam: NEGATIVE ng/mL (ref <25)
Alphahydroxymidazolam: NEGATIVE ng/mL (ref <50)
Aminoclonazepam: NEGATIVE ng/mL (ref <25)
Benzodiazepines: POSITIVE ng/mL — AB (ref <100)
Buprenorphine, Urine: NEGATIVE ng/mL (ref <5)
COCAINE METABOLITE: NEGATIVE ng/mL (ref <150)
Creatinine: 248.3 mg/dL
Hydroxyethylflurazepam: NEGATIVE ng/mL (ref <50)
Lorazepam: 291 ng/mL — ABNORMAL HIGH (ref <50)
MDMA: NEGATIVE ng/mL (ref <500)
Marijuana Metabolite: 226 ng/mL — ABNORMAL HIGH (ref <5)
Marijuana Metabolite: POSITIVE ng/mL — AB (ref <20)
NORDIAZEPAM: NEGATIVE ng/mL (ref <50)
OPIATES: NEGATIVE ng/mL (ref <100)
OXAZEPAM: NEGATIVE ng/mL (ref <50)
Oxidant: NEGATIVE ug/mL (ref <200)
Oxycodone: NEGATIVE ng/mL (ref <100)
Temazepam: NEGATIVE ng/mL (ref <50)
pH: 7.25 (ref 4.5–9.0)

## 2018-02-19 ENCOUNTER — Other Ambulatory Visit: Payer: Self-pay | Admitting: Family Medicine

## 2018-02-27 DIAGNOSIS — H25813 Combined forms of age-related cataract, bilateral: Secondary | ICD-10-CM | POA: Diagnosis not present

## 2018-02-27 DIAGNOSIS — H04123 Dry eye syndrome of bilateral lacrimal glands: Secondary | ICD-10-CM | POA: Diagnosis not present

## 2018-02-27 DIAGNOSIS — H40013 Open angle with borderline findings, low risk, bilateral: Secondary | ICD-10-CM | POA: Diagnosis not present

## 2018-02-27 LAB — HM DIABETES EYE EXAM

## 2018-02-28 ENCOUNTER — Encounter: Payer: Self-pay | Admitting: Family Medicine

## 2018-04-22 ENCOUNTER — Other Ambulatory Visit: Payer: Self-pay

## 2018-04-22 MED ORDER — LOSARTAN POTASSIUM 100 MG PO TABS: ORAL_TABLET | ORAL | 0 refills | Status: DC

## 2018-05-01 ENCOUNTER — Other Ambulatory Visit: Payer: Self-pay | Admitting: Family Medicine

## 2018-05-17 ENCOUNTER — Encounter: Payer: BLUE CROSS/BLUE SHIELD | Admitting: Family Medicine

## 2018-05-28 ENCOUNTER — Encounter: Payer: Self-pay | Admitting: Family Medicine

## 2018-05-28 ENCOUNTER — Ambulatory Visit (INDEPENDENT_AMBULATORY_CARE_PROVIDER_SITE_OTHER): Payer: BLUE CROSS/BLUE SHIELD | Admitting: Family Medicine

## 2018-05-28 VITALS — BP 139/90 | HR 59 | Temp 98.4°F | Resp 16 | Ht 67.0 in | Wt 225.1 lb

## 2018-05-28 DIAGNOSIS — F1291 Cannabis use, unspecified, in remission: Secondary | ICD-10-CM

## 2018-05-28 DIAGNOSIS — E669 Obesity, unspecified: Secondary | ICD-10-CM

## 2018-05-28 DIAGNOSIS — Z87898 Personal history of other specified conditions: Secondary | ICD-10-CM

## 2018-05-28 DIAGNOSIS — Z125 Encounter for screening for malignant neoplasm of prostate: Secondary | ICD-10-CM

## 2018-05-28 DIAGNOSIS — Z1211 Encounter for screening for malignant neoplasm of colon: Secondary | ICD-10-CM

## 2018-05-28 DIAGNOSIS — R7303 Prediabetes: Secondary | ICD-10-CM | POA: Diagnosis not present

## 2018-05-28 DIAGNOSIS — I10 Essential (primary) hypertension: Secondary | ICD-10-CM | POA: Diagnosis not present

## 2018-05-28 DIAGNOSIS — Z0000 Encounter for general adult medical examination without abnormal findings: Secondary | ICD-10-CM | POA: Diagnosis not present

## 2018-05-28 DIAGNOSIS — Z23 Encounter for immunization: Secondary | ICD-10-CM

## 2018-05-28 LAB — HEMOGLOBIN A1C: Hgb A1c MFr Bld: 6.7 % — ABNORMAL HIGH (ref 4.6–6.5)

## 2018-05-28 LAB — PSA: PSA: 0.3 ng/mL (ref 0.10–4.00)

## 2018-05-28 LAB — CBC WITH DIFFERENTIAL/PLATELET
Basophils Absolute: 0.1 10*3/uL (ref 0.0–0.1)
Basophils Relative: 1.5 % (ref 0.0–3.0)
EOS PCT: 7.4 % — AB (ref 0.0–5.0)
Eosinophils Absolute: 0.4 10*3/uL (ref 0.0–0.7)
HCT: 41.1 % (ref 39.0–52.0)
HEMOGLOBIN: 14.3 g/dL (ref 13.0–17.0)
Lymphocytes Relative: 29.4 % (ref 12.0–46.0)
Lymphs Abs: 1.6 10*3/uL (ref 0.7–4.0)
MCHC: 34.7 g/dL (ref 30.0–36.0)
MCV: 86.8 fl (ref 78.0–100.0)
MONO ABS: 0.4 10*3/uL (ref 0.1–1.0)
MONOS PCT: 7.2 % (ref 3.0–12.0)
Neutro Abs: 3 10*3/uL (ref 1.4–7.7)
Neutrophils Relative %: 54.5 % (ref 43.0–77.0)
Platelets: 182 10*3/uL (ref 150.0–400.0)
RBC: 4.74 Mil/uL (ref 4.22–5.81)
RDW: 13.6 % (ref 11.5–15.5)
WBC: 5.4 10*3/uL (ref 4.0–10.5)

## 2018-05-28 LAB — BASIC METABOLIC PANEL
BUN: 14 mg/dL (ref 6–23)
CO2: 30 mEq/L (ref 19–32)
Calcium: 10.5 mg/dL (ref 8.4–10.5)
Chloride: 104 mEq/L (ref 96–112)
Creatinine, Ser: 1.21 mg/dL (ref 0.40–1.50)
GFR: 65.19 mL/min (ref >60.00)
Glucose, Bld: 125 mg/dL — ABNORMAL HIGH (ref 70–99)
Potassium: 5 mEq/L (ref 3.5–5.1)
Sodium: 144 mEq/L (ref 135–145)

## 2018-05-28 LAB — TSH: TSH: 1.92 u[IU]/mL (ref 0.35–4.50)

## 2018-05-28 NOTE — Progress Notes (Signed)
Office Note 05/28/2018  CC:  Chief Complaint  Patient presents with  . Annual Exam    Pt is fasting.     HPI:  Eric Hunter is a 57 y.o. White male who is here for annual health maintenance exam. Feeling well.  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  Golfing some. Diet: working on cutting back on simple sugars. Dental: preventatives UTD. Eyes: exam UTD, preglaucoma.    Past Medical History:  Diagnosis Date  . Coronary artery disease    DES to RCA 2010 in setting of acute event; repeat cath 03/02/10 for chest pain showed widely patent stent and normal LV function.  . Diverticular disease    h/o 'itis  . Fatty liver   . Genital warts 06/23/2013  . History of pancreatitis   . History of peptic ulcer disease   . Hyperlipidemia   . Hypertension   . Mood disorder (HCC)    per old records; anger and irritability + elevated libido--improved on sertraline.  . Prediabetes 11/28/2016  . Renal artery stenosis (HCC) 2011   Discordant data (mild bilat ostial disease on distal aortogram at time of cath, renal arteries appeared normal.  Then renal doppler u/s showed R>L RAS, renal sizes similar.  Recheck of renal artery dopplers 06/2013 showed no significant changes--repeat in 12 months recommended.  . Right flank pain    apparently had a trigger point b/c once the orthopedist injected the region he had no further pain.  (extensive w/u done for this problem prior to the injection, though).    Past Surgical History:  Procedure Laterality Date  . COLONOSCOPY  2007   Normal per pt.  [place]  . CORONARY STENT PLACEMENT  09/14/09   RCA (Dr. Clarene DukeLittle)  . KNEE SURGERY  2007   Rt knee arthroscopy  . KNEE SURGERY  1983   Bilat knee debridement  . PALATE SURGERY  age 57 mo  . TYMPANOSTOMY TUBE PLACEMENT  1967    Family History  Problem Relation Age of Onset  . Heart attack Father   . Hypertension Father   . Heart attack Sister   .  Hypertension Sister   . Heart attack Brother   . Hypertension Brother   . Colon cancer Brother   . Diabetes Neg Hx     Social History   Socioeconomic History  . Marital status: Married    Spouse name: Not on file  . Number of children: Not on file  . Years of education: Not on file  . Highest education level: Not on file  Occupational History  . Not on file  Social Needs  . Financial resource strain: Not on file  . Food insecurity:    Worry: Not on file    Inability: Not on file  . Transportation needs:    Medical: Not on file    Non-medical: Not on file  Tobacco Use  . Smoking status: Former Smoker    Types: Pipe  . Smokeless tobacco: Never Used  . Tobacco comment: Pipe Only.  Substance and Sexual Activity  . Alcohol use: No  . Drug use: Yes    Types: Marijuana  . Sexual activity: Not on file  Lifestyle  . Physical activity:    Days per week: Not on file    Minutes per session: Not on file  . Stress: Not on file  Relationships  . Social connections:    Talks on phone: Not  on file    Gets together: Not on file    Attends religious service: Not on file    Active member of club or organization: Not on file    Attends meetings of clubs or organizations: Not on file    Relationship status: Not on file  . Intimate partner violence:    Fear of current or ex partner: Not on file    Emotionally abused: Not on file    Physically abused: Not on file    Forced sexual activity: Not on file  Other Topics Concern  . Not on file  Social History Narrative   Married, no children.   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   Education: BS from [employer].   No tobacco.  Rare use of marijuana (recreational).  No alcohol or drugs (other than marij).   Exercise: yard work, Systems analyst, [employer] and horseback riding.   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           Outpatient Medications Prior to Visit  Medication Sig Dispense Refill  . aspirin 325 MG tablet Take 325 mg by mouth daily.    Marland Kitchen atorvastatin (LIPITOR) 80 MG tablet Take 1 tablet (80 mg total) by mouth daily. 90 tablet 3  . hydrochlorothiazide (HYDRODIURIL) 25 MG tablet TAKE 1 TABLET BY MOUTH EVERY DAY 90 tablet 0  . hydrocortisone (ANUSOL-HC) 2.5 % rectal cream Place 1 application rectally 2 (two) times daily. 30 g 2  . losartan (COZAAR) 100 MG tablet Take one tablet once a day 90 tablet 0  . metFORMIN (GLUCOPHAGE) 500 MG tablet TAKE 1 TABLET (500 MG TOTAL) BY MOUTH 2 (TWO) TIMES DAILY WITH A MEAL. 180 tablet 0  . metoprolol succinate (TOPROL-XL) 100 MG 24 hr tablet TAKE 1 TABLET BY MOUTH DAILY WITH OR IMMEDIATELY FOLLOWING A MEAL. 90 tablet 1  . nitroGLYCERIN (NITROSTAT) 0.4 MG SL tablet Place 1 tablet (0.4 mg total) under the tongue every 5 (five) minutes as needed. 30 tablet 11  . azithromycin (ZITHROMAX) 250 MG tablet TAKE 2 TABLETS BY MOUTH TODAY, THEN TAKE 1 TABLET DAILY FOR 4 DAYS  0   No facility-administered medications prior to visit.     Allergies  Allergen Reactions  . Tramadol     Per pt: legs kick, trouble coming off  . Penicillins Rash and Other (See Comments)    Angio-edema    ROS Review of Systems  Constitutional: Negative for appetite change, chills, fatigue and fever.  HENT: Negative for congestion, dental problem, ear pain and sore throat.   Eyes: Negative for discharge, redness and visual disturbance.  Respiratory: Negative for cough, chest tightness, shortness of breath and wheezing.   Cardiovascular: Negative for chest pain, palpitations and leg swelling.  Gastrointestinal: Negative for abdominal pain, blood in stool, diarrhea, nausea and vomiting.  Genitourinary: Negative for difficulty urinating, dysuria, flank pain, frequency, hematuria and urgency.  Musculoskeletal: Negative for arthralgias, back pain, joint swelling, myalgias and neck stiffness.   Skin: Negative for pallor and rash.  Neurological: Negative for dizziness, speech difficulty, weakness and headaches.  Hematological: Negative for adenopathy. Does not bruise/bleed easily.  Psychiatric/Behavioral: Negative for confusion and sleep disturbance. The patient is not nervous/anxious.     PE; Blood pressure 139/90, pulse (!) 59, temperature 98.4 F (36.9 C), temperature source Oral, resp. rate 16, height 5\' 7"  (1.702 m),  weight 225 lb 2 oz (102.1 kg), SpO2 95 %. Body mass index is 35.26 kg/m.  Gen: Alert, well appearing.  Patient is oriented to person, place, time, and situation. AFFECT: pleasant, lucid thought and speech. ENT: Ears: EACs clear, normal epithelium.  TMs with good light reflex and landmarks bilaterally.  Eyes: no injection, icteris, swelling, or exudate.  EOMI, PERRLA. Nose: no drainage or turbinate edema/swelling.  No injection or focal lesion.  Mouth: lips without lesion/swelling.  Oral mucosa pink and moist.  Dentition intact and without obvious caries or gingival swelling.  Oropharynx without erythema, exudate, or swelling.  Neck: supple/nontender.  No LAD, mass, or TM.  Carotid pulses 2+ bilaterally, without bruits. CV: RRR, no m/r/g.   LUNGS: CTA bilat, nonlabored resps, good aeration in all lung fields. ABD: soft, NT, ND, BS normal.  No hepatospenomegaly or mass.  No bruits. EXT: no clubbing, cyanosis, or edema.  Musculoskeletal: no joint swelling, erythema, warmth, or tenderness.  ROM of all joints intact. Skin - no sores or suspicious lesions or rashes or color changes   Pertinent labs:   Lab Results  Component Value Date   WBC 8.9 05/03/2017   HGB 14.4 05/03/2017   HCT 42.4 05/03/2017   MCV 86.4 05/03/2017   PLT 196.0 05/03/2017   Lab Results  Component Value Date   CREATININE 1.16 02/14/2018   BUN 16 02/14/2018   NA 141 02/14/2018   K 4.7 02/14/2018   CL 102 02/14/2018   CO2 32 02/14/2018   Lab Results  Component Value Date   ALT 23  02/14/2018   AST 16 02/14/2018   ALKPHOS 32 (L) 02/14/2018   BILITOT 1.2 02/14/2018   Lab Results  Component Value Date   CHOL 137 02/14/2018   Lab Results  Component Value Date   HDL 55.10 02/14/2018   Lab Results  Component Value Date   LDLCALC 50 02/14/2018   Lab Results  Component Value Date   TRIG 160.0 (H) 02/14/2018   Lab Results  Component Value Date   CHOLHDL 2 02/14/2018   Lab Results  Component Value Date   PSA 0.36 07/09/2014   Lab Results  Component Value Date   HGBA1C 6.2 02/14/2018    ASSESSMENT AND PLAN:   Health maintenance exam: Reviewed age and gender appropriate health maintenance issues (prudent diet, regular exercise, health risks of tobacco and excessive alcohol, use of seatbelts, fire alarms in home, use of sunscreen).  Also reviewed age and gender appropriate health screening as well as vaccine recommendations. Vaccines: UTD.  Shingrix discussed-->#1 given here today. Labs: CBC, TSH, BMET, HbA1c, PSA. Prostate ca screening: DRE normal today, PSA. Colon ca screening:  Colonoscopy repeat was due 2017-->referred to GI today.  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 Wished him well, praying for him. He has hx of UDS + for marijuana, says he has now quit smoking pot, asks for repeat UDS today so he can have a "clean" UDS on record in case a potential employer wants it in near future.  An After Visit Summary was printed and given to the patient.  FOLLOW UP:  Return in about 6 months (around 11/27/2018) for routine chronic illness f/u.  Signed:  Santiago Bumpers, MD           05/28/2018

## 2018-05-28 NOTE — Addendum Note (Signed)
Addended by: Smitty KnudsenSUTHERLAND, HEATHER K on: 05/28/2018 10:20 AM   Modules accepted: Orders

## 2018-05-28 NOTE — Patient Instructions (Signed)

## 2018-05-29 LAB — PAIN MGMT, PROFILE 8 W/CONF, U
6 ACETYLMORPHINE: NEGATIVE ng/mL (ref <10)
ALCOHOL METABOLITES: NEGATIVE ng/mL (ref <500)
Amphetamines: NEGATIVE ng/mL (ref <500)
Benzodiazepines: NEGATIVE ng/mL (ref <100)
Buprenorphine, Urine: NEGATIVE ng/mL (ref <5)
Cocaine Metabolite: NEGATIVE ng/mL (ref <150)
Creatinine: 189 mg/dL
MDMA: NEGATIVE ng/mL (ref <500)
Marijuana Metabolite: NEGATIVE ng/mL (ref <20)
OPIATES: NEGATIVE ng/mL (ref <100)
Oxidant: NEGATIVE ug/mL (ref <200)
Oxycodone: NEGATIVE ng/mL (ref <100)
PH: 5.82 (ref 4.5–9.0)

## 2018-07-14 ENCOUNTER — Other Ambulatory Visit: Payer: Self-pay | Admitting: Family Medicine

## 2018-07-30 ENCOUNTER — Other Ambulatory Visit: Payer: Self-pay | Admitting: Family Medicine

## 2018-08-02 ENCOUNTER — Telehealth: Payer: Self-pay | Admitting: Family Medicine

## 2018-08-02 MED ORDER — METFORMIN HCL 500 MG PO TABS: 500.0000 mg | ORAL_TABLET | Freq: Two times a day (BID) | ORAL | 1 refills | Status: DC

## 2018-08-02 NOTE — Telephone Encounter (Signed)
Metformin 500mg  refill Last Refill:  # 05/28/18 Last OV: 05/28/18 PCP: [facility] Pharmacy:[facility] [phone]

## 2018-08-02 NOTE — Telephone Encounter (Signed)
Copied from CRM [id number]. Topic: Quick Communication - Rx Refill/Question >> Aug 02, 2018  9:20 AM Henry Russelawoud, Jessica L wrote: Medication: metFORMIN (GLUCOPHAGE) 500 MG tablet [[id number]][[id number]]   Has the patient contacted their pharmacy? Yes  Preferred Pharmacy (with phone number or street name):[id number] [place], [place] - [address] [phone] (Phone) [phone] (Fax)  Agent: Please be advised that RX refills may take up to 3 business days. We ask that you follow-up with your pharmacy.

## 2018-08-07 ENCOUNTER — Encounter: Payer: Self-pay | Admitting: Family Medicine

## 2018-08-18 ENCOUNTER — Other Ambulatory Visit: Payer: Self-pay | Admitting: Family Medicine

## 2018-08-28 ENCOUNTER — Ambulatory Visit: Payer: BLUE CROSS/BLUE SHIELD

## 2018-08-28 IMAGING — CT CT ABD-PELV W/ CM
2 of 5 series · 16 of 46 positions shown, 18 images · IV contrast (APPLIED)
Comparison: 08/19/2012

CLINICAL DATA: Left lower quadrant pain radiating to the back for 2
months.

EXAM:
CT ABDOMEN AND PELVIS WITH CONTRAST
TECHNIQUE: Multidetector CT imaging of the abdomen and pelvis was performed
using the standard protocol following bolus administration of
intravenous contrast.
CONTRAST:  100mL KZEZRB-R22 IOPAMIDOL (KZEZRB-R22) INJECTION 61%

[Series 2: axial st · axial · 0.78mm/px · z∈[+592,+1027]mm · 13 of 99 slices shown, 15 images]
[im 6/99  soft-tissue]
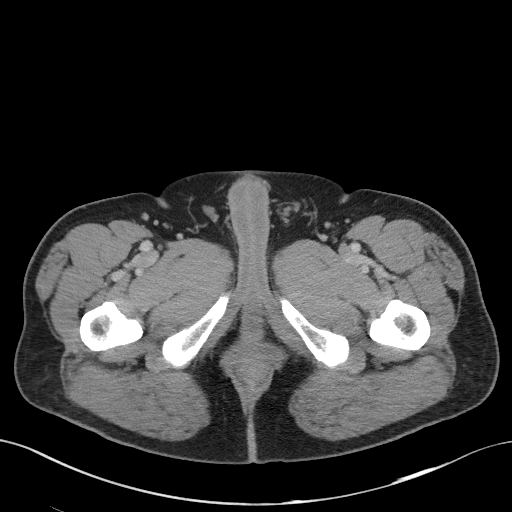
[im 6/99  bone]
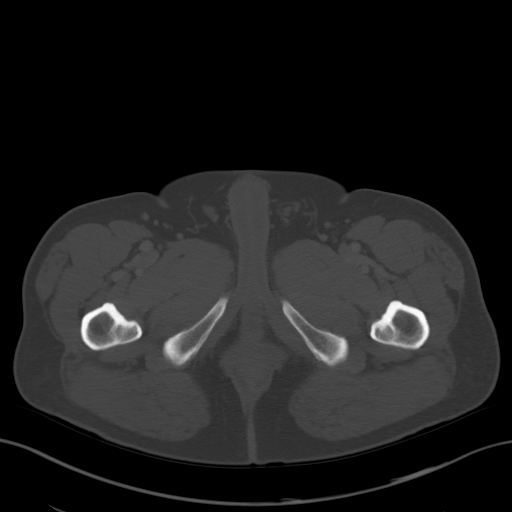
[im 16/99  soft-tissue]
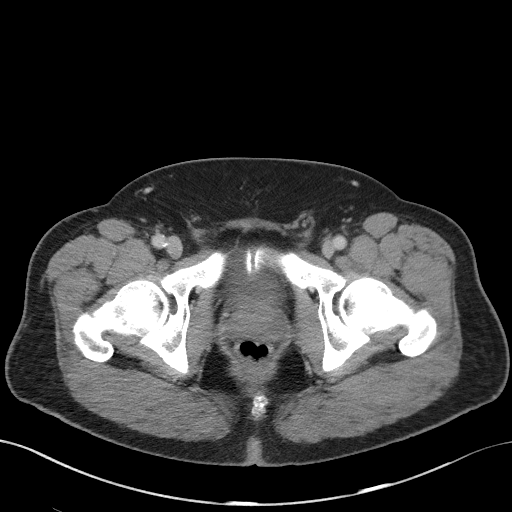
[im 21/99  soft-tissue]
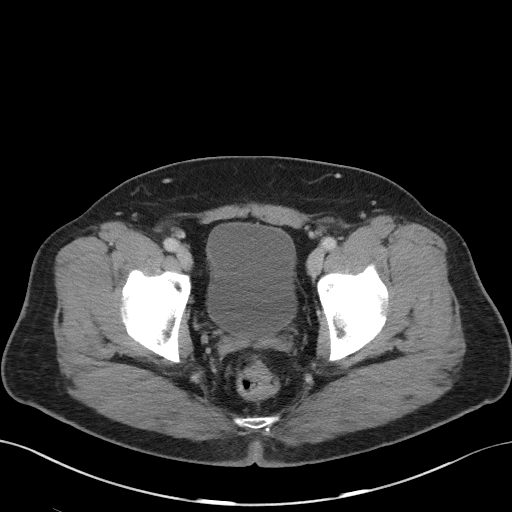
[im 26/99  soft-tissue]
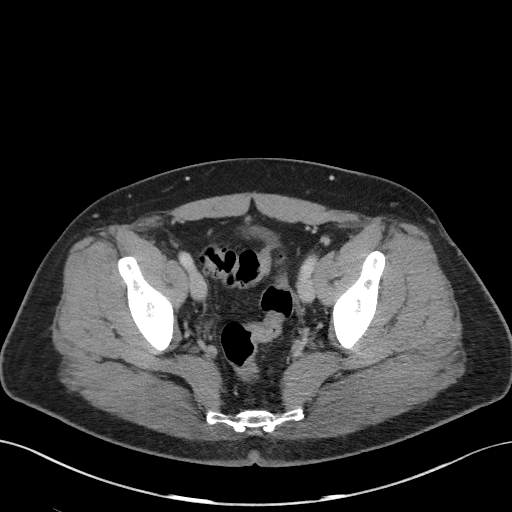
[im 37/99  soft-tissue]
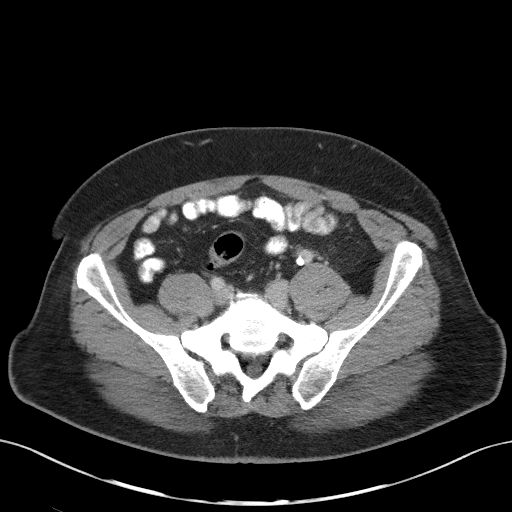
[im 42/99  soft-tissue]
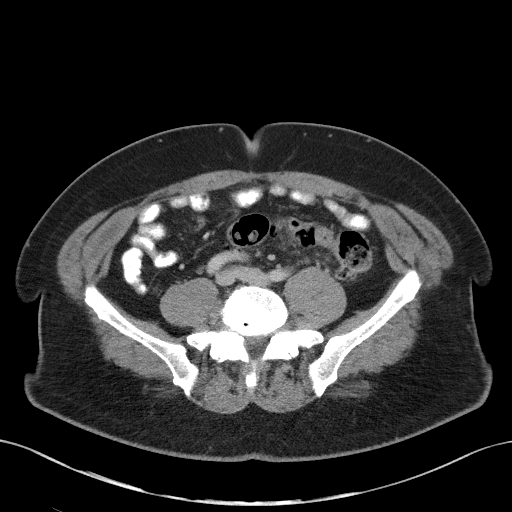
[im 52/99  soft-tissue]
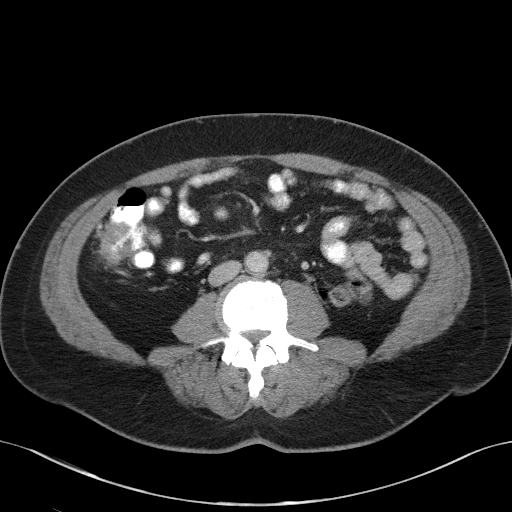
[im 57/99  soft-tissue]
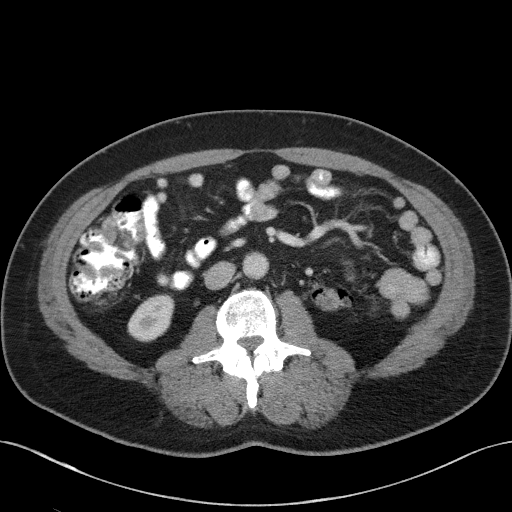
[im 62/99  soft-tissue]
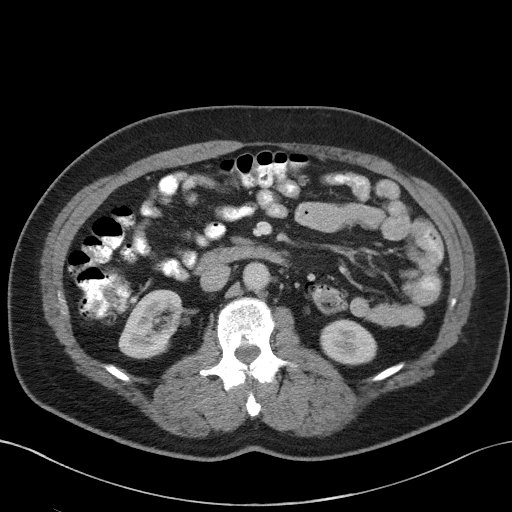
[im 62/99  bone]
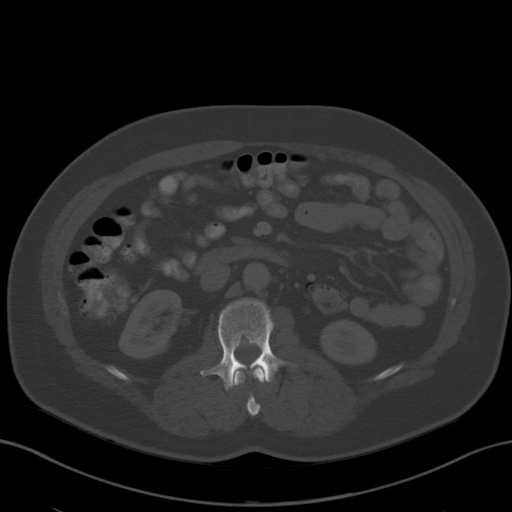
[im 73/99  soft-tissue]
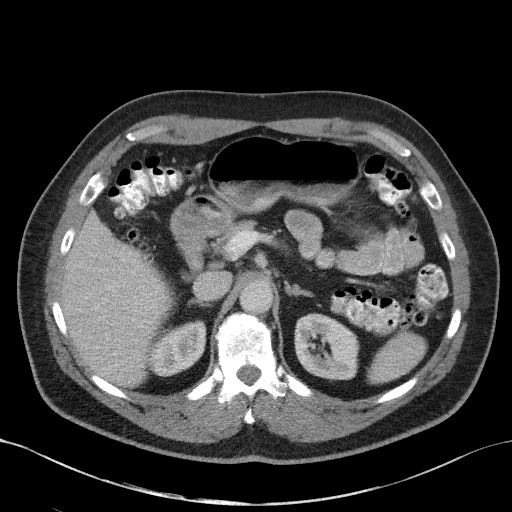
[im 78/99  soft-tissue]
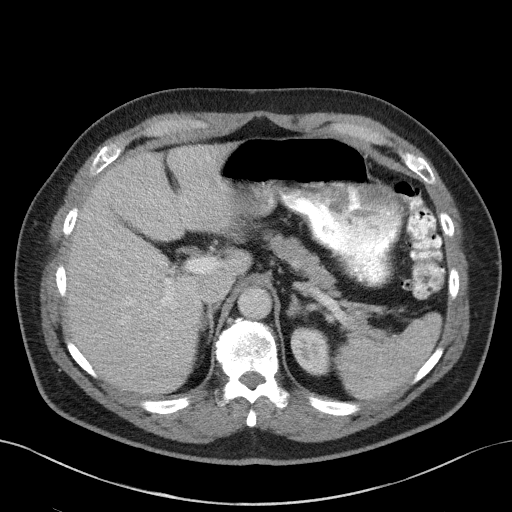
[im 83/99  soft-tissue]
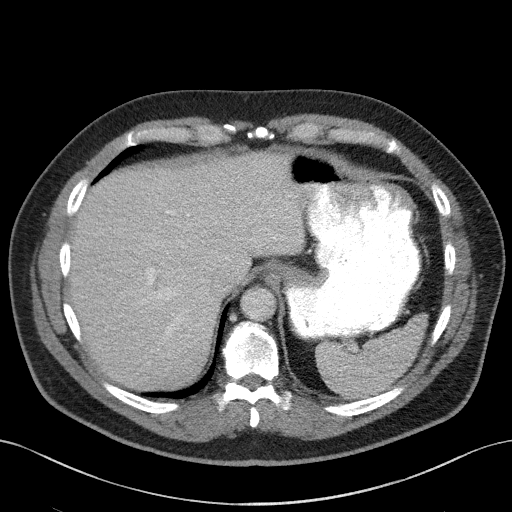
[im 93/99  soft-tissue]
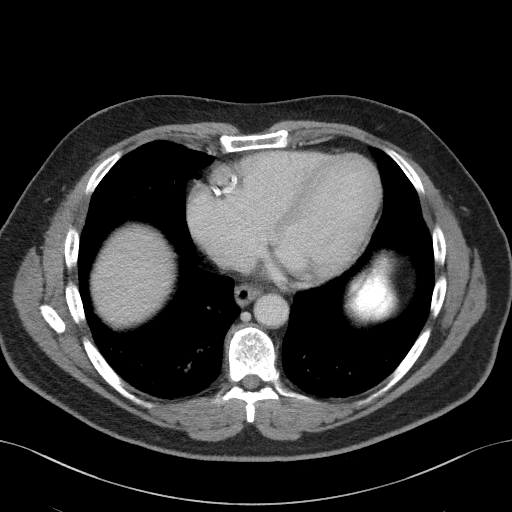

[Series 5: coronal st · coronal · 0.75mm/px · 3 of 101 slices shown]
[im 34/101  soft-tissue]
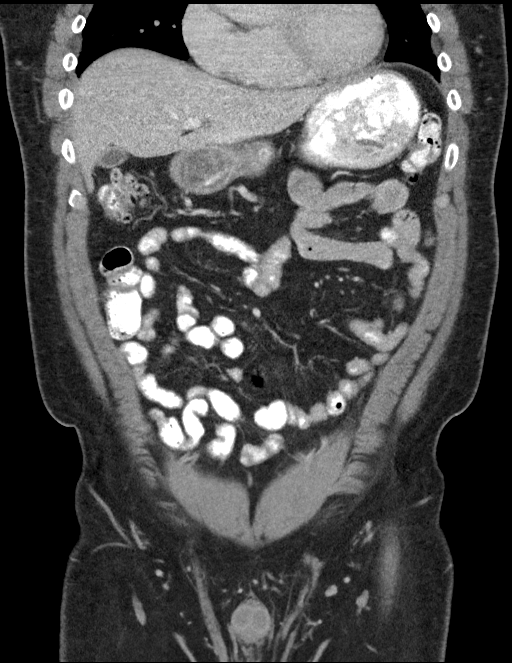
[im 45/101  soft-tissue]
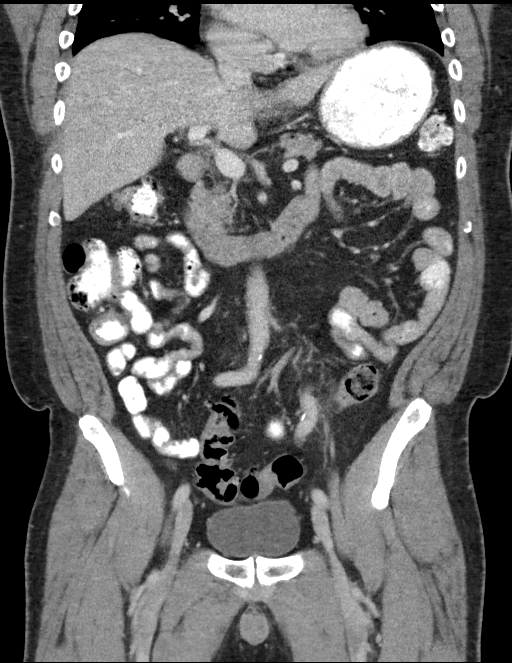
[im 56/101  soft-tissue]
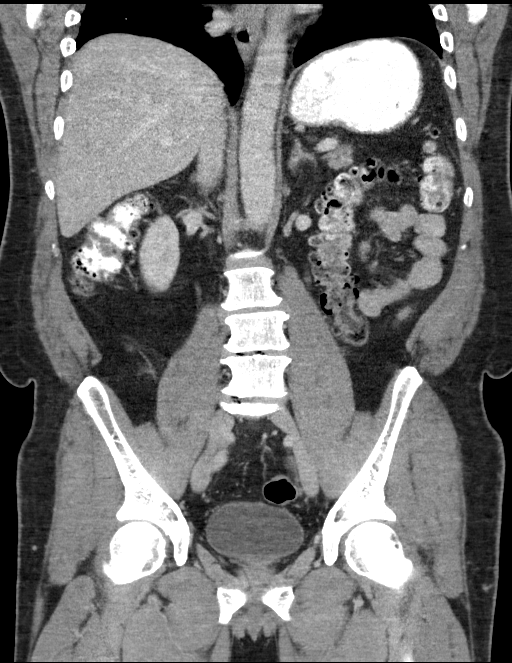

[16 of 46 positions shown; findings below may reference images not displayed]

FINDINGS: Lower chest: No acute abnormality. Coronary artery atherosclerosis
partially visualize.

Hepatobiliary: No focal liver abnormality is seen. No gallstones,
gallbladder wall thickening, or biliary dilatation.

Pancreas: Unremarkable. No pancreatic ductal dilatation or
surrounding inflammatory changes.

Spleen: Normal in size without focal abnormality.

Adrenals/Urinary Tract: Adrenal glands are unremarkable. Kidneys are
normal, without renal calculi, focal lesion, or hydronephrosis.
Bladder is unremarkable.

Stomach/Bowel: Stomach is within normal limits. Appendix appears
normal. No bowel dilatation. Diverticulosis of the sigmoid colon.
Mild diverticular wall thickening with mild hazy inflammatory
changes adjacently in most consistent with mild acute
diverticulitis. No focal fluid collection to suggest an abscess.

Vascular/Lymphatic: Normal caliber abdominal aorta with mild
atherosclerosis. No lymphadenopathy.

Reproductive: Prostate is unremarkable.

Other: No fluid collection or hematoma.

Musculoskeletal: No acute osseous abnormality. No lytic or sclerotic
osseous lesion. Degenerative disc disease with disc height loss at
L3-4 and L4-5 with bilateral facet arthropathy.
IMPRESSION: 1. Mild acute diverticulitis of the proximal sigmoid colon. No focal
fluid collection to suggest an abscess.

## 2018-10-08 ENCOUNTER — Encounter: Payer: Self-pay | Admitting: Family Medicine

## 2018-10-08 ENCOUNTER — Ambulatory Visit (INDEPENDENT_AMBULATORY_CARE_PROVIDER_SITE_OTHER): Payer: BLUE CROSS/BLUE SHIELD | Admitting: Family Medicine

## 2018-10-08 VITALS — BP 145/87 | HR 66 | Temp 98.4°F | Resp 16 | Ht 67.0 in | Wt 219.2 lb

## 2018-10-08 DIAGNOSIS — R197 Diarrhea, unspecified: Secondary | ICD-10-CM

## 2018-10-08 DIAGNOSIS — Z23 Encounter for immunization: Secondary | ICD-10-CM

## 2018-10-08 NOTE — Progress Notes (Signed)
Letter only\  Signed:  Santiago Bumpers, MD           10/08/2018

## 2018-10-08 NOTE — Progress Notes (Signed)
OFFICE VISIT  10/08/2018   CC:  Chief Complaint  Patient presents with  . Diarrhea   HPI:    Patient is a 57 y.o. Caucasian male with DM 2, HTN, and HLD who presents for diarrhea. Onset about 5 hours ago, woke up with some lower abd cramping and watery BMs.  Crampiness and loose/watery BMs have been steady this morning: about 6-7 episodes.  No fever.  Currently feels mild LLQ soreness.  Still feels like he has urge to have BM.  Mild nausea, some excessive GER today but no vomiting. HA at onset, but this is gone.    Currently working at [employer] urgent [employer].   Has been there 7 mo.  Is always around sick people. He also ate 3 donuts at [employer] yesterday.  No recent antibiotics. He was able to drink 2 cups of coffee this morning.  Past Medical History:  Diagnosis Date  . Coronary artery disease    DES to RCA 2010 in setting of acute event; repeat cath 03/02/10 for chest pain showed widely patent stent and normal LV function.  . Diverticular disease    h/o 'itis  . Fatty liver   . Genital warts 06/23/2013  . History of pancreatitis   . History of peptic ulcer disease   . Hyperlipidemia   . Hypertension   . Mood disorder (HCC)    per old records; anger and irritability + elevated libido--improved on sertraline.  . Prediabetes 11/28/2016  . Renal artery stenosis (HCC) 2011   Discordant data (mild bilat ostial disease on distal aortogram at time of cath, renal arteries appeared normal.  Then renal doppler u/s showed R>L RAS, renal sizes similar.  Recheck of renal artery dopplers 06/2013 showed no significant changes--repeat in 12 months recommended.  . Right flank pain    apparently had a trigger point b/c once the orthopedist injected the region he had no further pain.  (extensive w/u done for this problem prior to the injection, though).    Past Surgical History:  Procedure Laterality Date  . COLONOSCOPY  2007   Normal per pt.  Recall 2017 (done in [place], [place]).  .  CORONARY STENT PLACEMENT  09/14/09   RCA (Dr. Clarene Duke)  . KNEE SURGERY  2007   Rt knee arthroscopy  . KNEE SURGERY  1983   Bilat knee debridement  . PALATE SURGERY  age 10 mo  . TYMPANOSTOMY TUBE PLACEMENT  1967    Outpatient Medications Prior to Visit  Medication Sig Dispense Refill  . aspirin 325 MG tablet Take 325 mg by mouth daily.    Marland Kitchen atorvastatin (LIPITOR) 80 MG tablet Take 1 tablet (80 mg total) by mouth daily. 90 tablet 3  . hydrochlorothiazide (HYDRODIURIL) 25 MG tablet TAKE 1 TABLET BY MOUTH EVERY DAY 90 tablet 1  . hydrocortisone (ANUSOL-HC) 2.5 % rectal cream Place 1 application rectally 2 (two) times daily. 30 g 2  . losartan (COZAAR) 100 MG tablet TAKE 1 TABLET BY MOUTH EVERY DAY 90 tablet 1  . metFORMIN (GLUCOPHAGE) 500 MG tablet Take 1 tablet (500 mg total) by mouth 2 (two) times daily with a meal. 180 tablet 1  . metoprolol succinate (TOPROL-XL) 100 MG 24 hr tablet TAKE 1 TABLET BY MOUTH DAILY WITH OR IMMEDIATELY FOLLOWING A MEAL. 90 tablet 1  . nitroGLYCERIN (NITROSTAT) 0.4 MG SL tablet Place 1 tablet (0.4 mg total) under the tongue every 5 (five) minutes as needed. 30 tablet 11   No facility-administered medications prior to  visit.     Allergies  Allergen Reactions  . Tramadol     Per pt: legs kick, trouble coming off  . Penicillins Rash and Other (See Comments)    Angio-edema    ROS As per HPI  PE: Blood pressure (!) 145/87, pulse 66, temperature 98.4 F (36.9 C), temperature source Oral, resp. rate 16, height 5\' 7"  (1.702 m), weight 219 lb 4 oz (99.5 kg), SpO2 95 %. Gen: Alert, well appearing.  Patient is oriented to person, place, time, and situation. AFFECT: pleasant, lucid thought and speech. ZOX:WRUE: no injection, icteris, swelling, or exudate.  EOMI, PERRLA. Mouth: lips without lesion/swelling.  Oral mucosa pink and moist. Oropharynx without erythema, exudate, or swelling.  CV: RRR, no m/r/g.   LUNGS: CTA bilat, nonlabored resps, good aeration in  all lung fields. ABD: soft, NT, ND, BS normal.  No hepatospenomegaly or mass.  No bruits. EXT: no clubbing or cyanosis.  no edema.  Skin: no jaundice.  LABS:    Chemistry      Component Value Date/Time   NA 144 05/28/2018 1023   K 5.0 05/28/2018 1023   CL 104 05/28/2018 1023   CO2 30 05/28/2018 1023   BUN 14 05/28/2018 1023   CREATININE 1.21 05/28/2018 1023      Component Value Date/Time   CALCIUM 10.5 05/28/2018 1023   ALKPHOS 32 (L) 02/14/2018 0904   AST 16 02/14/2018 0904   ALT 23 02/14/2018 0904   BILITOT 1.2 02/14/2018 0904     05/28/18 GFR = 65 ml/min  Lab Results  Component Value Date   HGBA1C 6.7 (H) 05/28/2018    IMPRESSION AND PLAN:  Acute diarrhea, possibly infectious etiology (viral). No signif sign of dehydration at this time, but encouraged good hydration with electrolyte rich clear fluids. May use imodium sparingly, esp if he has to leave his house today for anything. He can hold off on taking his chronic meds until he is feeling much better. I did write a note excusing him from work today.  An After Visit Summary was printed and given to the patient.  FOLLOW UP: Return if symptoms worsen or fail to improve.  Signed:  Santiago Bumpers, MD           10/08/2018

## 2018-10-26 ENCOUNTER — Other Ambulatory Visit: Payer: Self-pay | Admitting: Family Medicine

## 2018-11-27 ENCOUNTER — Ambulatory Visit: Payer: BLUE CROSS/BLUE SHIELD | Admitting: Family Medicine

## 2018-12-17 ENCOUNTER — Ambulatory Visit: Payer: BLUE CROSS/BLUE SHIELD | Admitting: Family Medicine

## 2019-01-27 ENCOUNTER — Other Ambulatory Visit: Payer: Self-pay | Admitting: Family Medicine

## 2019-01-27 NOTE — Telephone Encounter (Signed)
Patient is due for f/u [facility] with Dr.McGowen. will send Losartan Rx #90 w/ 0RF. Needs office visit for more refills.   LMTCB to schedule appt.// bds

## 2019-01-29 ENCOUNTER — Other Ambulatory Visit: Payer: Self-pay | Admitting: Family Medicine

## 2019-01-29 NOTE — Telephone Encounter (Signed)
Patient is due for F/U [facility]. Will send rx for #90 w/ 0RF  Working on contacting patient to schedule appt (see other refill request)

## 2019-02-06 NOTE — Telephone Encounter (Signed)
Lmtrc to schedule office visit.

## 2019-02-13 NOTE — Telephone Encounter (Signed)
Left message for pt to call back.   Okay for PEC to advise pt. 

## 2019-02-17 ENCOUNTER — Encounter: Payer: Self-pay | Admitting: *Deleted

## 2019-02-17 NOTE — Telephone Encounter (Signed)
Pt has not returned call. Letter mailed to address in EMR.

## 2019-04-21 ENCOUNTER — Other Ambulatory Visit: Payer: Self-pay | Admitting: Family Medicine

## 2019-04-25 ENCOUNTER — Ambulatory Visit (INDEPENDENT_AMBULATORY_CARE_PROVIDER_SITE_OTHER): Payer: 59 | Admitting: Family Medicine

## 2019-04-25 ENCOUNTER — Other Ambulatory Visit: Payer: Self-pay

## 2019-04-25 ENCOUNTER — Encounter: Payer: Self-pay | Admitting: Family Medicine

## 2019-04-25 VITALS — BP 142/88 | HR 88

## 2019-04-25 DIAGNOSIS — J301 Allergic rhinitis due to pollen: Secondary | ICD-10-CM

## 2019-04-25 DIAGNOSIS — I2510 Atherosclerotic heart disease of native coronary artery without angina pectoris: Secondary | ICD-10-CM

## 2019-04-25 DIAGNOSIS — Z125 Encounter for screening for malignant neoplasm of prostate: Secondary | ICD-10-CM

## 2019-04-25 DIAGNOSIS — E119 Type 2 diabetes mellitus without complications: Secondary | ICD-10-CM

## 2019-04-25 DIAGNOSIS — E7800 Pure hypercholesterolemia, unspecified: Secondary | ICD-10-CM | POA: Diagnosis not present

## 2019-04-25 DIAGNOSIS — I2583 Coronary atherosclerosis due to lipid rich plaque: Secondary | ICD-10-CM

## 2019-04-25 DIAGNOSIS — I10 Essential (primary) hypertension: Secondary | ICD-10-CM

## 2019-04-25 MED ORDER — METOPROLOL SUCCINATE ER 100 MG PO TB24: ORAL_TABLET | ORAL | 1 refills | Status: DC

## 2019-04-25 MED ORDER — FLUTICASONE PROPIONATE 50 MCG/ACT NA SUSP: 2.0000 | Freq: Every day | NASAL | 6 refills | Status: DC

## 2019-04-25 MED ORDER — CETIRIZINE HCL 10 MG PO TABS: 10.0000 mg | ORAL_TABLET | Freq: Every day | ORAL | 11 refills | Status: DC

## 2019-04-25 NOTE — Progress Notes (Signed)
Virtual Visit via Video Note  I connected with pt on 04/25/19 at  1:20 PM EDT by a video enabled telemedicine application but had to be converted to telephone visit due to technical problems with video platforms--> and verified that I am speaking with the correct person using two identifiers.  Location patient: home Location provider:work or home office Persons participating in the virtual visit: patient, provider  I discussed the limitations of evaluation and management by telemedicine/telephone and the availability of in person appointments. The patient expressed understanding and agreed to proceed.  Telemedicine/telephone visit is a necessity given the COVID-19 restrictions in place at the current time.  HPI: 58 y/o WM being seen today for f/u HTN, HLD, DM 2.  Has hx of CAD, stent placement in 2010. I last saw him about 11 mo ago for his cpe. He left his most recent job before the covid pandemic hit. He gained a little weight but it came off.  Prediabetes->His A1c last visit was 6.7%, putting him into diabetic range now.  Plan was to work harder on diet/exercise/wt loss and continue metformin 500 mg bid.  HTN: home bp's/work bp's low 130s over low 80s, then ran out of toprol 1 mo ago and they came up to 140s systolic.  CAD/HLD: on statin, BB, ACE-I, ASA. No probs tolerating meds. No CP, SOB, palpitations, dizziness, arm pain, or jaw pain. He is playing tennis, golf, working in yard.  Having nasal congestion, sneezing, some sinus fullness, tickle in throat this spring, has no rx allergy meds and requests some.  No cough or wheeze or face/teeth pain.  No fever. Of note, 05/28/18-->referred pt to GI for colon ca screening but he did not do this b/c he was busy trying to start his new job w/out being absent.  ROS:  no cough,no HAs, no rashes, no melena/hematochezia.  No polyuria or polydipsia.  No myalgias or arthralgias.   Past Medical History:  Diagnosis Date  . Coronary artery  disease    DES to RCA 2010 in setting of acute event; repeat cath 03/02/10 for chest pain showed widely patent stent and normal LV function.  . Diverticular disease    h/o 'itis  . Fatty liver   . Genital warts 06/23/2013  . History of pancreatitis   . History of peptic ulcer disease   . Hyperlipidemia   . Hypertension   . Mood disorder (HCC)    per old records; anger and irritability + elevated libido--improved on sertraline.  . Prediabetes 11/28/2016  . Renal artery stenosis (HCC) 2011   Discordant data (mild bilat ostial disease on distal aortogram at time of cath, renal arteries appeared normal.  Then renal doppler u/s showed R>L RAS, renal sizes similar.  Recheck of renal artery dopplers 06/2013 showed no significant changes--repeat in 12 months recommended.  . Right flank pain    apparently had a trigger point b/c once the orthopedist injected the region he had no further pain.  (extensive w/u done for this problem prior to the injection, though).    Past Surgical History:  Procedure Laterality Date  . COLONOSCOPY  2007   Normal per pt.  Recall 2017 (done in [place], [place]).  . CORONARY STENT PLACEMENT  09/14/09   RCA (Dr. Clarene Duke)  . KNEE SURGERY  2007   Rt knee arthroscopy  . KNEE SURGERY  1983   Bilat knee debridement  . PALATE SURGERY  age 55 mo  . TYMPANOSTOMY TUBE PLACEMENT  1967    Family  History  Problem Relation Age of Onset  . Heart attack Father   . Hypertension Father   . Heart attack Sister   . Hypertension Sister   . Heart attack Brother   . Hypertension Brother   . Colon cancer Brother   . Diabetes Neg Hx      Current Outpatient Medications:  .  aspirin 325 MG tablet, Take 325 mg by mouth daily., Disp: , Rfl:  .  atorvastatin (LIPITOR) 80 MG tablet, Take 1 tablet (80 mg total) by mouth daily., Disp: 90 tablet, Rfl: 3 .  hydrochlorothiazide (HYDRODIURIL) 25 MG tablet, Take 1 tablet (25 mg total) by mouth daily. OFFICE VISIT NEEDED, Disp: 90 tablet, Rfl:  0 .  losartan (COZAAR) 100 MG tablet, TAKE 1 TABLET BY MOUTH EVERY DAY, Disp: 90 tablet, Rfl: 0 .  metFORMIN (GLUCOPHAGE) 500 MG tablet, Take 1 tablet (500 mg total) by mouth 2 (two) times daily with a meal., Disp: 180 tablet, Rfl: 1 .  metoprolol succinate (TOPROL-XL) 100 MG 24 hr tablet, TAKE 1 TABLET BY MOUTH DAILY WITH OR IMMEDIATELY FOLLOWING A MEAL., Disp: 90 tablet, Rfl: 1 .  hydrocortisone (ANUSOL-HC) 2.5 % rectal cream, Place 1 application rectally 2 (two) times daily. (Patient not taking: Reported on 04/25/2019), Disp: 30 g, Rfl: 2 .  nitroGLYCERIN (NITROSTAT) 0.4 MG SL tablet, Place 1 tablet (0.4 mg total) under the tongue every 5 (five) minutes as needed. (Patient not taking: Reported on 04/25/2019), Disp: 30 tablet, Rfl: 11  EXAM:  VITALS per patient if applicable: BP (!) 142/88 (BP Location: Left Arm, Patient Position: Sitting, Cuff Size: Normal)   Pulse 88   SpO2 98%    GENERAL: alert, oriented, lucid thought and speech. No further exam b/c this visit was converted to a telephone visit.  LABS: none today  Lab Results  Component Value Date   TSH 1.92 05/28/2018   Lab Results  Component Value Date   WBC 5.4 05/28/2018   HGB 14.3 05/28/2018   HCT 41.1 05/28/2018   MCV 86.8 05/28/2018   PLT 182.0 05/28/2018   Lab Results  Component Value Date   CREATININE 1.21 05/28/2018   BUN 14 05/28/2018   NA 144 05/28/2018   K 5.0 05/28/2018   CL 104 05/28/2018   CO2 30 05/28/2018   Lab Results  Component Value Date   ALT 23 02/14/2018   AST 16 02/14/2018   ALKPHOS 32 (L) 02/14/2018   BILITOT 1.2 02/14/2018   Lab Results  Component Value Date   CHOL 137 02/14/2018   Lab Results  Component Value Date   HDL 55.10 02/14/2018   Lab Results  Component Value Date   LDLCALC 50 02/14/2018   Lab Results  Component Value Date   TRIG 160.0 (H) 02/14/2018   Lab Results  Component Value Date   CHOLHDL 2 02/14/2018   Lab Results  Component Value Date   PSA 0.30  05/28/2018   PSA 0.36 07/09/2014   Lab Results  Component Value Date   HGBA1C 6.7 (H) 05/28/2018    ASSESSMENT AND PLAN:  Discussed the following assessment and plan:  1) DM 2: dx'd 1 yr ago, lost to f/u since then. Reminded pt of need for annual diab retpthy screening. HbA1c and urine microalb/cr --future. Lytes/cr future.  2) HTN: his control was good prior to running out of toprol 1 mo ago. He is trying hard to lose wt, doesn't want to change meds any at this time. Lytes/cr --future.  3)  HLD: tolerating statin. FLP and hepatic panel--future.  4) CAD: asympatomatic.  Continue BB, ASA, ACE-I, statin.  Has sl nitro to use prn CP.  5) Allergic rhinitis: start flonase and zyrtec qd.  6) Preventative health:  Prostate ca screening->PSA with upcoming labs. Colon ca screening-->he chooses to defer a new referral for colonoscopy at this time. We'll address this again at f/u in 6 mo.   I discussed the assessment and treatment plan with the patient. The patient was provided an opportunity to ask questions and all were answered. The patient agreed with the plan and demonstrated an understanding of the instructions.   The patient was advised to call back or seek an in-person evaluation if the symptoms worsen or if the condition fails to improve as anticipated.  F/u: 6 mo cpe  Signed:  Santiago Bumpers, MD           04/25/2019

## 2019-04-28 ENCOUNTER — Ambulatory Visit: Payer: 59

## 2019-04-28 NOTE — Addendum Note (Signed)
Addended by: Jadie Comas M on: 04/28/2019 02:34 PM   Modules accepted: Orders  

## 2019-04-28 NOTE — Addendum Note (Signed)
Addended by: Keidan Aumiller M on: 04/28/2019 02:34 PM   Modules accepted: Orders  

## 2019-04-28 NOTE — Addendum Note (Signed)
Addended by: Alania Overholt M on: 04/28/2019 02:34 PM   Modules accepted: Orders  

## 2019-04-28 NOTE — Addendum Note (Signed)
Addended by: Eulah Pont on: 04/28/2019 02:34 PM   Modules accepted: Orders

## 2019-04-28 NOTE — Addendum Note (Signed)
Addended by: Vu Liebman M on: 04/28/2019 02:34 PM   Modules accepted: Orders  

## 2019-04-28 NOTE — Addendum Note (Signed)
Addended by: Alletta Mattos M on: 04/28/2019 02:34 PM   Modules accepted: Orders  

## 2019-05-02 ENCOUNTER — Other Ambulatory Visit: Payer: Self-pay | Admitting: Family Medicine

## 2019-05-07 ENCOUNTER — Other Ambulatory Visit: Payer: Self-pay | Admitting: Family Medicine

## 2019-05-09 ENCOUNTER — Ambulatory Visit (INDEPENDENT_AMBULATORY_CARE_PROVIDER_SITE_OTHER): Payer: 59 | Admitting: Family Medicine

## 2019-05-09 ENCOUNTER — Other Ambulatory Visit: Payer: Self-pay

## 2019-05-09 DIAGNOSIS — I2583 Coronary atherosclerosis due to lipid rich plaque: Secondary | ICD-10-CM

## 2019-05-09 DIAGNOSIS — E119 Type 2 diabetes mellitus without complications: Secondary | ICD-10-CM | POA: Diagnosis not present

## 2019-05-09 DIAGNOSIS — I2510 Atherosclerotic heart disease of native coronary artery without angina pectoris: Secondary | ICD-10-CM

## 2019-05-09 DIAGNOSIS — Z125 Encounter for screening for malignant neoplasm of prostate: Secondary | ICD-10-CM

## 2019-05-09 DIAGNOSIS — I10 Essential (primary) hypertension: Secondary | ICD-10-CM

## 2019-05-09 DIAGNOSIS — E7800 Pure hypercholesterolemia, unspecified: Secondary | ICD-10-CM

## 2019-05-10 LAB — COMPREHENSIVE METABOLIC PANEL
AG Ratio: 1.6 (calc) (ref 1.0–2.5)
ALT: 22 U/L (ref 9–46)
AST: 16 U/L (ref 10–35)
Albumin: 4.5 g/dL (ref 3.6–5.1)
Alkaline phosphatase (APISO): 36 U/L (ref 35–144)
BUN: 15 mg/dL (ref 7–25)
CO2: 28 mmol/L (ref 20–32)
Calcium: 10.8 mg/dL — ABNORMAL HIGH (ref 8.6–10.3)
Chloride: 102 mmol/L (ref 98–110)
Creat: 1.23 mg/dL (ref 0.70–1.25)
Globulin: 2.8 g/dL (calc) (ref 1.9–3.7)
Glucose, Bld: 118 mg/dL — ABNORMAL HIGH (ref 65–99)
Potassium: 4.6 mmol/L (ref 3.5–5.3)
Sodium: 143 mmol/L (ref 135–146)
Total Bilirubin: 1.2 mg/dL (ref 0.2–1.2)
Total Protein: 7.3 g/dL (ref 6.1–8.1)

## 2019-05-10 LAB — LIPID PANEL
Cholesterol: 185 mg/dL (ref <200)
HDL: 37 mg/dL — ABNORMAL LOW (ref >=40)
LDL Cholesterol (Calc): 114 mg/dL (calc) — ABNORMAL HIGH
Non-HDL Cholesterol (Calc): 148 mg/dL (calc) — ABNORMAL HIGH (ref <130)
Total CHOL/HDL Ratio: 5 (calc) — ABNORMAL HIGH (ref <5.0)
Triglycerides: 218 mg/dL — ABNORMAL HIGH (ref <150)

## 2019-05-10 LAB — TSH: TSH: 1.8 mIU/L (ref 0.40–4.50)

## 2019-05-10 LAB — HEMOGLOBIN A1C
Hgb A1c MFr Bld: 6.7 % of total Hgb — ABNORMAL HIGH (ref <5.7)
Mean Plasma Glucose: 146 (calc)
eAG (mmol/L): 8.1 (calc)

## 2019-05-10 LAB — PSA: PSA: 0.4 ng/mL (ref <=4.0)

## 2019-07-25 ENCOUNTER — Other Ambulatory Visit: Payer: Self-pay | Admitting: Family Medicine

## 2019-08-08 ENCOUNTER — Other Ambulatory Visit: Payer: Self-pay | Admitting: Family Medicine

## 2019-09-29 ENCOUNTER — Other Ambulatory Visit: Payer: Self-pay

## 2019-09-29 ENCOUNTER — Ambulatory Visit (INDEPENDENT_AMBULATORY_CARE_PROVIDER_SITE_OTHER): Payer: 59

## 2019-09-29 DIAGNOSIS — Z23 Encounter for immunization: Secondary | ICD-10-CM

## 2019-10-11 ENCOUNTER — Other Ambulatory Visit: Payer: Self-pay | Admitting: Family Medicine

## 2019-10-18 ENCOUNTER — Other Ambulatory Visit: Payer: Self-pay | Admitting: Family Medicine

## 2019-10-19 ENCOUNTER — Other Ambulatory Visit: Payer: Self-pay | Admitting: Family Medicine

## 2019-11-07 ENCOUNTER — Other Ambulatory Visit: Payer: Self-pay | Admitting: Family Medicine

## 2019-11-13 ENCOUNTER — Other Ambulatory Visit: Payer: Self-pay | Admitting: Family Medicine

## 2019-11-20 ENCOUNTER — Other Ambulatory Visit: Payer: Self-pay | Admitting: Family Medicine

## 2019-11-20 NOTE — Telephone Encounter (Signed)
Pt was due for next appt, CPE. He has been scheduled for 12/15. Will send in Rx

## 2019-11-24 ENCOUNTER — Other Ambulatory Visit: Payer: Self-pay

## 2019-11-25 ENCOUNTER — Ambulatory Visit (INDEPENDENT_AMBULATORY_CARE_PROVIDER_SITE_OTHER): Payer: 59 | Admitting: Family Medicine

## 2019-11-25 ENCOUNTER — Encounter: Payer: Self-pay | Admitting: Family Medicine

## 2019-11-25 VITALS — BP 143/90 | HR 53 | Temp 98.4°F | Resp 16 | Ht 67.0 in | Wt 221.6 lb

## 2019-11-25 DIAGNOSIS — I10 Essential (primary) hypertension: Secondary | ICD-10-CM | POA: Diagnosis not present

## 2019-11-25 DIAGNOSIS — Z125 Encounter for screening for malignant neoplasm of prostate: Secondary | ICD-10-CM | POA: Diagnosis not present

## 2019-11-25 DIAGNOSIS — Z23 Encounter for immunization: Secondary | ICD-10-CM | POA: Diagnosis not present

## 2019-11-25 DIAGNOSIS — E7800 Pure hypercholesterolemia, unspecified: Secondary | ICD-10-CM

## 2019-11-25 DIAGNOSIS — Z1211 Encounter for screening for malignant neoplasm of colon: Secondary | ICD-10-CM

## 2019-11-25 DIAGNOSIS — E119 Type 2 diabetes mellitus without complications: Secondary | ICD-10-CM

## 2019-11-25 DIAGNOSIS — I2583 Coronary atherosclerosis due to lipid rich plaque: Secondary | ICD-10-CM | POA: Diagnosis not present

## 2019-11-25 DIAGNOSIS — Z0000 Encounter for general adult medical examination without abnormal findings: Secondary | ICD-10-CM

## 2019-11-25 DIAGNOSIS — I2510 Atherosclerotic heart disease of native coronary artery without angina pectoris: Secondary | ICD-10-CM

## 2019-11-25 LAB — LIPID PANEL
Cholesterol: 202 mg/dL — ABNORMAL HIGH (ref 0–200)
HDL: 41 mg/dL (ref >39.00)
LDL Cholesterol: 134 mg/dL — ABNORMAL HIGH (ref 0–99)
NonHDL: 161.37
Total CHOL/HDL Ratio: 5
Triglycerides: 136 mg/dL (ref 0.0–149.0)
VLDL: 27.2 mg/dL (ref 0.0–40.0)

## 2019-11-25 LAB — CBC WITH DIFFERENTIAL/PLATELET
Basophils Absolute: 0.1 10*3/uL (ref 0.0–0.1)
Basophils Relative: 1.6 % (ref 0.0–3.0)
Eosinophils Absolute: 0.5 10*3/uL (ref 0.0–0.7)
Eosinophils Relative: 8.4 % — ABNORMAL HIGH (ref 0.0–5.0)
HCT: 44 % (ref 39.0–52.0)
Hemoglobin: 14.7 g/dL (ref 13.0–17.0)
Lymphocytes Relative: 24.5 % (ref 12.0–46.0)
Lymphs Abs: 1.5 10*3/uL (ref 0.7–4.0)
MCHC: 33.4 g/dL (ref 30.0–36.0)
MCV: 88.2 fl (ref 78.0–100.0)
Monocytes Absolute: 0.6 10*3/uL (ref 0.1–1.0)
Monocytes Relative: 9.2 % (ref 3.0–12.0)
Neutro Abs: 3.4 10*3/uL (ref 1.4–7.7)
Neutrophils Relative %: 56.3 % (ref 43.0–77.0)
Platelets: 182 10*3/uL (ref 150.0–400.0)
RBC: 4.99 Mil/uL (ref 4.22–5.81)
RDW: 13.7 % (ref 11.5–15.5)
WBC: 6 10*3/uL (ref 4.0–10.5)

## 2019-11-25 LAB — COMPREHENSIVE METABOLIC PANEL
ALT: 19 U/L (ref 0–53)
AST: 16 U/L (ref 0–37)
Albumin: 4.6 g/dL (ref 3.5–5.2)
Alkaline Phosphatase: 33 U/L — ABNORMAL LOW (ref 39–117)
BUN: 14 mg/dL (ref 6–23)
CO2: 30 mEq/L (ref 19–32)
Calcium: 10.3 mg/dL (ref 8.4–10.5)
Chloride: 103 mEq/L (ref 96–112)
Creatinine, Ser: 1.26 mg/dL (ref 0.40–1.50)
GFR: 58.24 mL/min — ABNORMAL LOW (ref >60.00)
Glucose, Bld: 123 mg/dL — ABNORMAL HIGH (ref 70–99)
Potassium: 4.5 mEq/L (ref 3.5–5.1)
Sodium: 142 mEq/L (ref 135–145)
Total Bilirubin: 0.9 mg/dL (ref 0.2–1.2)
Total Protein: 7.6 g/dL (ref 6.0–8.3)

## 2019-11-25 LAB — MICROALBUMIN / CREATININE URINE RATIO
Creatinine,U: 182 mg/dL
Microalb Creat Ratio: 0.4 mg/g (ref 0.0–30.0)
Microalb, Ur: <0.7 mg/dL (ref 0.0–1.9)

## 2019-11-25 LAB — TSH: TSH: 2.05 u[IU]/mL (ref 0.35–4.50)

## 2019-11-25 LAB — HEMOGLOBIN A1C: Hgb A1c MFr Bld: 6.8 % — ABNORMAL HIGH (ref 4.6–6.5)

## 2019-11-25 MED ORDER — ATORVASTATIN CALCIUM 80 MG PO TABS: 80.0000 mg | ORAL_TABLET | Freq: Every day | ORAL | 3 refills | Status: DC

## 2019-11-25 MED ORDER — LOSARTAN POTASSIUM 100 MG PO TABS: 100.0000 mg | ORAL_TABLET | Freq: Every day | ORAL | 3 refills | Status: DC

## 2019-11-25 MED ORDER — METFORMIN HCL 500 MG PO TABS: 500.0000 mg | ORAL_TABLET | Freq: Two times a day (BID) | ORAL | 3 refills | Status: DC

## 2019-11-25 MED ORDER — HYDROCHLOROTHIAZIDE 25 MG PO TABS: 25.0000 mg | ORAL_TABLET | Freq: Every day | ORAL | 3 refills | Status: DC

## 2019-11-25 MED ORDER — METOPROLOL SUCCINATE ER 100 MG PO TB24: ORAL_TABLET | ORAL | 3 refills | Status: DC

## 2019-11-25 MED ORDER — AMLODIPINE BESYLATE 5 MG PO TABS: 5.0000 mg | ORAL_TABLET | Freq: Every day | ORAL | 1 refills | Status: DC

## 2019-11-25 NOTE — Addendum Note (Signed)
Addended by: Deveron Furlong D on: 11/25/2019 10:41 AM   Modules accepted: Orders

## 2019-11-25 NOTE — Progress Notes (Signed)
Office Note 11/25/2019  CC:  Chief Complaint  Patient presents with  . Annual Exam    pt is fasting    HPI:  Eric Hunter is a 58 y.o. White male who is here for annual health maintenance exam.    Wife lost her job, has some med problems, now he has to go back to work.  He had quit job at an [employer] when pandemic hit.  Exercising: playing tennis and golf when he can. Diet: "better".  More fruit.  BP: no losartan for last 3-4 d.  Home bp checks a couple times a week-> low 140s/upper 80s, as high as 150/low 90s. HRs in 50s.    He is active and has no exertional CP, SOB, DOE, dizzines, palpitations, arm pain, or jaw pain.  Past Medical History:  Diagnosis Date  . Coronary artery disease    DES to RCA 2010 in setting of acute event; repeat cath 03/02/10 for chest pain showed widely patent stent and normal LV function.  . Diverticular disease    h/o 'itis  . Fatty liver   . Genital warts 06/23/2013  . History of pancreatitis   . History of peptic ulcer disease   . Hyperlipidemia   . Hypertension   . Mood disorder (HCC)    per old records; anger and irritability + elevated libido--improved on sertraline.  . Prediabetes 11/28/2016  . Renal artery stenosis (HCC) 2011   Discordant data (mild bilat ostial disease on distal aortogram at time of cath, renal arteries appeared normal.  Then renal doppler u/s showed R>L RAS, renal sizes similar.  Recheck of renal artery dopplers 06/2013 showed no significant changes--repeat in 12 months recommended.  . Right flank pain    apparently had a trigger point b/c once the orthopedist injected the region he had no further pain.  (extensive w/u done for this problem prior to the injection, though).  FH colon cancer in brother.  Past Surgical History:  Procedure Laterality Date  . COLONOSCOPY  2007   Normal per pt.  Recall 2017 (done in [place], [place]).  . CORONARY STENT PLACEMENT  09/14/09   RCA (Dr. Clarene Duke)  . KNEE SURGERY  2007   Rt knee  arthroscopy  . KNEE SURGERY  1983   Bilat knee debridement  . PALATE SURGERY  age 66 mo  . TYMPANOSTOMY TUBE PLACEMENT  1967    Family History  Problem Relation Age of Onset  . Heart attack Father   . Hypertension Father   . Heart attack Sister   . Hypertension Sister   . Heart attack Brother   . Hypertension Brother   . Colon cancer Brother   . Diabetes Neg Hx     Social History   Socioeconomic History  . Marital status: Married    Spouse name: Not on file  . Number of children: Not on file  . Years of education: Not on file  . Highest education level: Not on file  Occupational History  . Not on file  Tobacco Use  . Smoking status: Former Smoker    Types: Pipe  . Smokeless tobacco: Never Used  . Tobacco comment: Pipe Only.  Substance and Sexual Activity  . Alcohol use: No  . Drug use: Yes    Types: Marijuana  . Sexual activity: Not on file  Other Topics Concern  . Not on file  Social History Narrative   Married, no children.   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   Education: BS from [employer].   No tobacco.  Rare use of marijuana (recreational).  No alcohol or drugs (other than marij).   Exercise: yard work, Office manager, [employer] and horseback riding.   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         Social Determinants of Health   Financial Resource Strain:   . Difficulty of Paying Living Expenses: Not on file  Food Insecurity:   . Worried About Charity fundraiser in the Last Year: Not on file  . Ran Out of Food in the Last Year: Not on file  Transportation Needs:   . Lack of Transportation (Medical): Not on file  . Lack of Transportation (Non-Medical): Not on file  Physical Activity:   . Days of Exercise per Week: Not on file  . Minutes of Exercise per Session: Not on file  Stress:   . Feeling of Stress : Not on file  Social  Connections:   . Frequency of Communication with Friends and Family: Not on file  . Frequency of Social Gatherings with Friends and Family: Not on file  . Attends Religious Services: Not on file  . Active Member of Clubs or Organizations: Not on file  . Attends Archivist Meetings: Not on file  . Marital Status: Not on file  Intimate Partner Violence:   . Fear of Current or Ex-Partner: Not on file  . Emotionally Abused: Not on file  . Physically Abused: Not on file  . Sexually Abused: Not on file    Outpatient Medications Prior to Visit  Medication Sig Dispense Refill  . aspirin 325 MG tablet Take 325 mg by mouth daily.    . cetirizine (ZYRTEC) 10 MG tablet Take 1 tablet (10 mg total) by mouth daily. 30 tablet 11  . atorvastatin (LIPITOR) 80 MG tablet Take 1 tablet (80 mg total) by mouth daily. 90 tablet 3  . hydrochlorothiazide (HYDRODIURIL) 25 MG tablet TAKE 1 TABLET (25 MG TOTAL) BY MOUTH DAILY. OFFICE VISIT NEEDED 90 tablet 0  . losartan (COZAAR) 100 MG tablet TAKE 1 TABLET BY MOUTH EVERY DAY 90 tablet 0  . metFORMIN (GLUCOPHAGE) 500 MG tablet TAKE 1 TABLET (500 MG TOTAL) BY MOUTH 2 (TWO) TIMES DAILY WITH A MEAL. 60 tablet 0  . metoprolol succinate (TOPROL-XL) 100 MG 24 hr tablet TAKE 1 TABLET BY MOUTH DAILY WITH OR IMMEDIATELY FOLLOWING A MEAL. 90 tablet 0  . fluticasone (FLONASE) 50 MCG/ACT nasal spray Place 2 sprays into both nostrils daily. (Patient not taking: Reported on 11/25/2019) 16 g 6  . hydrocortisone (ANUSOL-HC) 2.5 % rectal cream Place 1 application rectally 2 (two) times daily. (Patient not taking: Reported on 04/25/2019) 30 g 2  . nitroGLYCERIN (NITROSTAT) 0.4 MG SL tablet Place 1 tablet (0.4 mg total) under the tongue every 5 (five) minutes as needed. (Patient not taking: Reported on 04/25/2019) 30 tablet 11   No facility-administered medications prior to visit.    Allergies  Allergen Reactions  . Tramadol     Per pt: legs kick, trouble coming off  .  Penicillins Rash and Other (See Comments)    Angio-edema    ROS Review of Systems  Constitutional: Negative for appetite change, chills, fatigue and fever.  HENT: Negative for congestion, dental problem, ear pain and sore throat.   Eyes: Negative for discharge, redness and visual disturbance.  Respiratory: Negative for cough, chest tightness, shortness of breath and wheezing.   Cardiovascular: Negative for chest pain, palpitations and leg swelling.  Gastrointestinal: Negative for abdominal pain, blood in stool, diarrhea, nausea and vomiting.  Genitourinary: Negative for difficulty urinating, dysuria, flank pain, frequency, hematuria and urgency.  Musculoskeletal: Negative for arthralgias, back pain, joint swelling, myalgias and neck stiffness.  Skin: Negative for pallor and rash.  Neurological: Negative for dizziness, speech difficulty, weakness and headaches.  Hematological: Negative for adenopathy. Does not bruise/bleed easily.  Psychiatric/Behavioral: Negative for confusion and sleep disturbance. The patient is not nervous/anxious.     PE; Blood pressure (!) 143/90, pulse (!) 53, temperature 98.4 F (36.9 C), temperature source Temporal, resp. rate 16, height 5\' 7"  (1.702 m), weight 221 lb 9.6 oz (100.5 kg), SpO2 96 %. Body mass index is 34.71 kg/m.  Gen: Alert, well appearing.  Patient is oriented to person, place, time, and situation. AFFECT: pleasant, lucid thought and speech. ENT: Ears: EACs clear, normal epithelium.  TMs with good light reflex and landmarks bilaterally.  Eyes: no injection, icteris, swelling, or exudate.  EOMI, PERRLA. Nose: no drainage or turbinate edema/swelling.  No injection or focal lesion.  Mouth: lips without lesion/swelling.  Oral mucosa pink and moist.  Dentition intact and without obvious caries or gingival swelling.  Oropharynx without erythema, exudate, or swelling.  Neck: supple/nontender.  No LAD, mass, or TM.  Carotid pulses 2+ bilaterally, without  bruits. CV: RRR, no m/r/g.   LUNGS: CTA bilat, nonlabored resps, good aeration in all lung fields. ABD: soft, NT, ND, BS normal.  No hepatospenomegaly or mass.  No bruits. EXT: no clubbing, cyanosis, or edema.  Musculoskeletal: no joint swelling, erythema, warmth, or tenderness.  ROM of all joints intact. Skin - no sores or suspicious lesions or rashes or color changes Rectal: pt deferred  Pertinent labs:  Lab Results  Component Value Date   TSH 1.80 05/09/2019   Lab Results  Component Value Date   WBC 5.4 05/28/2018   HGB 14.3 05/28/2018   HCT 41.1 05/28/2018   MCV 86.8 05/28/2018   PLT 182.0 05/28/2018   Lab Results  Component Value Date   CREATININE 1.23 05/09/2019   BUN 15 05/09/2019   NA 143 05/09/2019   K 4.6 05/09/2019   CL 102 05/09/2019   CO2 28 05/09/2019   Lab Results  Component Value Date   ALT 22 05/09/2019   AST 16 05/09/2019   ALKPHOS 32 (L) 02/14/2018   BILITOT 1.2 05/09/2019   Lab Results  Component Value Date   CHOL 185 05/09/2019   Lab Results  Component Value Date   HDL 37 (L) 05/09/2019   Lab Results  Component Value Date   LDLCALC 114 (H) 05/09/2019   Lab Results  Component Value Date   TRIG 218 (H) 05/09/2019   Lab Results  Component Value Date   CHOLHDL 5.0 (H) 05/09/2019   Lab Results  Component Value Date   PSA 0.4 05/09/2019   PSA 0.30 05/28/2018   PSA 0.36 07/09/2014   Lab Results  Component Value Date   HGBA1C 6.7 (H) 05/09/2019    ASSESSMENT AND PLAN:   HTN: add amlodipine 5mg  to current regimen. Health maintenance exam: Reviewed age and gender appropriate health maintenance issues (prudent diet, regular exercise, health risks of tobacco and excessive alcohol, use of seatbelts, fire alarms in home, use of sunscreen).  Also reviewed age and gender appropriate health screening as well as vaccine recommendations. Vaccines: All UTD  except for shingrix #2-->given today. Labs: CBC, CMET, TSH, FLP, HbA1c. Prostate ca  screening: PSA 05/09/19 normal.  Repeat after 04/2020. Colon ca screening: FH of colon ca in brother: pt was due for recall as of 2017->he'll call and reschedule ([place], [place]).  An After Visit Summary was printed and given to the patient.  FOLLOW UP:  Return in about 4 weeks (around 12/23/2019) for telemedicine f/u HTN.  Signed:  Santiago Bumpers, MD           11/25/2019

## 2019-11-25 NOTE — Patient Instructions (Signed)

## 2019-11-26 ENCOUNTER — Encounter: Payer: Self-pay | Admitting: Family Medicine

## 2019-12-02 ENCOUNTER — Other Ambulatory Visit: Payer: Self-pay

## 2019-12-02 DIAGNOSIS — E782 Mixed hyperlipidemia: Secondary | ICD-10-CM

## 2019-12-02 MED ORDER — EZETIMIBE 10 MG PO TABS: 10.0000 mg | ORAL_TABLET | Freq: Every day | ORAL | 2 refills | Status: DC

## 2019-12-18 ENCOUNTER — Other Ambulatory Visit: Payer: Self-pay | Admitting: Family Medicine

## 2019-12-26 ENCOUNTER — Encounter: Payer: Self-pay | Admitting: Family Medicine

## 2019-12-26 ENCOUNTER — Other Ambulatory Visit: Payer: Self-pay

## 2019-12-26 ENCOUNTER — Encounter: Payer: 59 | Admitting: Family Medicine

## 2019-12-26 ENCOUNTER — Telehealth: Payer: Self-pay

## 2019-12-26 DIAGNOSIS — Z0289 Encounter for other administrative examinations: Secondary | ICD-10-CM

## 2019-12-26 NOTE — Telephone Encounter (Signed)
Pt was supposed to have f/u appt today. Attempted to reach several times with no success. Message was left on home and cell number to call back in order to start visit.

## 2019-12-26 NOTE — Progress Notes (Deleted)
Virtual Visit via Video Note  I connected with pt on 12/26/19 at  1:30 PM EST by a video enabled telemedicine application and verified that I am speaking with the correct person using two identifiers.  Location patient: home Location provider:work or home office Persons participating in the virtual visit: patient, provider  I discussed the limitations of evaluation and management by telemedicine and the availability of in person appointments. The patient expressed understanding and agreed to proceed.  Telemedicine visit is a necessity given the COVID-19 restrictions in place at the current time.  HPI: 59 y/o WM being seen today for 1 mo f/u mildly uncontrolled HTN and hypercholesterolemia. I added amlodipine to his regimen 74mo ago. Also, at that time I added zetia 10mg  qd to his atorva 80mg  b/c LDL up to 134.  Interim hx:  PATIENT WAS CALLED FOR APPT CHECK IN MULTIPLE TIMES AT MULTIPLE PHONE NUMBERS AND WAS UNABLE TO BE CONTACTED.    ROS: See pertinent positives and negatives per HPI.  Past Medical History:  Diagnosis Date  . Coronary artery disease    DES to RCA 2010 in setting of acute event; repeat cath 03/02/10 for chest pain showed widely patent stent and normal LV function.  . Diabetes mellitus without complication ([place]) 8546  . Diverticular disease    h/o 'itis  . Fatty liver   . Genital warts 06/23/2013  . History of pancreatitis   . History of peptic ulcer disease   . Hyperlipidemia    Atorva 80. Recommended adding zetia 11/2019  . Hypertension   . Mood disorder ([place])    per old records; anger and irritability + elevated libido--improved on sertraline.  . Renal artery stenosis ([place]) 2011   Discordant data (mild bilat ostial disease on distal aortogram at time of cath, renal arteries appeared normal.  Then renal doppler u/s showed R>L RAS, renal sizes similar.  Recheck of renal artery dopplers 06/2013 showed no significant changes--repeat in 12 months recommended.  .  Right flank pain    apparently had a trigger point b/c once the orthopedist injected the region he had no further pain.  (extensive w/u done for this problem prior to the injection, though).    Past Surgical History:  Procedure Laterality Date  . COLONOSCOPY  2007   Normal per pt.  Recall 2017 (done in [place], [place]).  . CORONARY STENT PLACEMENT  09/14/09   RCA (Dr. Rex Kras)  . KNEE SURGERY  2007   Rt knee arthroscopy  . KNEE SURGERY  1983   Bilat knee debridement  . PALATE SURGERY  age 43 mo  . TYMPANOSTOMY TUBE PLACEMENT  1967    Family History  Problem Relation Age of Onset  . Heart attack Father   . Hypertension Father   . Heart attack Sister   . Hypertension Sister   . Heart attack Brother   . Hypertension Brother   . Colon cancer Brother   . Diabetes Neg Hx      Current Outpatient Medications:  .  amLODipine (NORVASC) 5 MG tablet, TAKE 1 TABLET BY MOUTH EVERY DAY, Disp: 30 tablet, Rfl: 1 .  aspirin 325 MG tablet, Take 325 mg by mouth daily., Disp: , Rfl:  .  atorvastatin (LIPITOR) 80 MG tablet, Take 1 tablet (80 mg total) by mouth daily., Disp: 90 tablet, Rfl: 3 .  cetirizine (ZYRTEC) 10 MG tablet, Take 1 tablet (10 mg total) by mouth daily., Disp: 30 tablet, Rfl: 11 .  ezetimibe (ZETIA) 10 MG tablet,  Take 1 tablet (10 mg total) by mouth daily., Disp: 30 tablet, Rfl: 2 .  fluticasone (FLONASE) 50 MCG/ACT nasal spray, Place 2 sprays into both nostrils daily. (Patient not taking: Reported on 11/25/2019), Disp: 16 g, Rfl: 6 .  hydrochlorothiazide (HYDRODIURIL) 25 MG tablet, Take 1 tablet (25 mg total) by mouth daily., Disp: 90 tablet, Rfl: 3 .  hydrocortisone (ANUSOL-HC) 2.5 % rectal cream, Place 1 application rectally 2 (two) times daily. (Patient not taking: Reported on 04/25/2019), Disp: 30 g, Rfl: 2 .  losartan (COZAAR) 100 MG tablet, Take 1 tablet (100 mg total) by mouth daily., Disp: 90 tablet, Rfl: 3 .  metFORMIN (GLUCOPHAGE) 500 MG tablet, Take 1 tablet (500 mg total)  by mouth 2 (two) times daily with a meal., Disp: 180 tablet, Rfl: 3 .  metoprolol succinate (TOPROL-XL) 100 MG 24 hr tablet, Take with or immediately following a meal., Disp: 90 tablet, Rfl: 3 .  nitroGLYCERIN (NITROSTAT) 0.4 MG SL tablet, Place 1 tablet (0.4 mg total) under the tongue every 5 (five) minutes as needed. (Patient not taking: Reported on 04/25/2019), Disp: 30 tablet, Rfl: 11  EXAM:  VITALS per patient if applicable: There were no vitals taken for this visit.   GENERAL: alert, oriented, appears well and in no acute distress  HEENT: atraumatic, conjunttiva clear, no obvious abnormalities on inspection of external nose and ears  NECK: normal movements of the head and neck  LUNGS: on inspection no signs of respiratory distress, breathing rate appears normal, no obvious gross SOB, gasping or wheezing  CV: no obvious cyanosis  MS: moves all visible extremities without noticeable abnormality  PSYCH/NEURO: pleasant and cooperative, no obvious depression or anxiety, speech and thought processing grossly intact  LABS: none today    Chemistry      Component Value Date/Time   NA 142 11/25/2019 1000   K 4.5 11/25/2019 1000   CL 103 11/25/2019 1000   CO2 30 11/25/2019 1000   BUN 14 11/25/2019 1000   CREATININE 1.26 11/25/2019 1000   CREATININE 1.23 05/09/2019 0949      Component Value Date/Time   CALCIUM 10.3 11/25/2019 1000   ALKPHOS 33 (L) 11/25/2019 1000   AST 16 11/25/2019 1000   ALT 19 11/25/2019 1000   BILITOT 0.9 11/25/2019 1000     Lab Results  Component Value Date   HGBA1C 6.8 (H) 11/25/2019   Lab Results  Component Value Date   CHOL 202 (H) 11/25/2019   HDL 41.00 11/25/2019   LDLCALC 134 (H) 11/25/2019   LDLDIRECT 115.2 07/09/2014   TRIG 136.0 11/25/2019   CHOLHDL 5 11/25/2019    ASSESSMENT AND PLAN:  Discussed the following assessment and plan:  No diagnosis found.  PATIENT WAS CALLED FOR APPT CHECK IN MULTIPLE TIMES AT MULTIPLE PHONE NUMBERS  AND WAS UNABLE TO BE CONTACTED.     -we discussed possible serious and likely etiologies, options for evaluation and workup, limitations of telemedicine visit vs in person visit, treatment, treatment risks and precautions. Pt prefers to treat via telemedicine empirically rather then risking or undertaking an in person visit at this moment. Patient agrees to seek prompt in person care if worsening, new symptoms arise, or if is not improving with treatment.   I discussed the assessment and treatment plan with the patient. The patient was provided an opportunity to ask questions and all were answered. The patient agreed with the plan and demonstrated an understanding of the instructions.   The patient was advised to call  back or seek an in-person evaluation if the symptoms worsen or if the condition fails to improve as anticipated.    Signed:  Santiago Bumpers, MD           12/26/2019

## 2020-01-08 ENCOUNTER — Ambulatory Visit (INDEPENDENT_AMBULATORY_CARE_PROVIDER_SITE_OTHER): Payer: 59 | Admitting: Family Medicine

## 2020-01-08 ENCOUNTER — Other Ambulatory Visit: Payer: Self-pay

## 2020-01-08 ENCOUNTER — Encounter: Payer: Self-pay | Admitting: Family Medicine

## 2020-01-08 VITALS — BP 130/88 | HR 70

## 2020-01-08 DIAGNOSIS — E119 Type 2 diabetes mellitus without complications: Secondary | ICD-10-CM

## 2020-01-08 DIAGNOSIS — I10 Essential (primary) hypertension: Secondary | ICD-10-CM | POA: Diagnosis not present

## 2020-01-08 DIAGNOSIS — E7800 Pure hypercholesterolemia, unspecified: Secondary | ICD-10-CM

## 2020-01-08 MED ORDER — AMLODIPINE BESYLATE 10 MG PO TABS: 10.0000 mg | ORAL_TABLET | Freq: Every day | ORAL | 3 refills | Status: DC

## 2020-01-08 NOTE — Progress Notes (Signed)
Virtual Visit via Video Note  I connected with Eric Hunter on 01/08/20 at 11:30 AM EST by a telephone (video enabled telemedicine application failed) verified that I am speaking with the correct person using two identifiers.  Location patient: home Location provider:work or home office Persons participating in the virtual visit: patient, provider  I discussed the limitations of evaluation and management by telemedicine and the availability of in person appointments. The patient expressed understanding and agreed to proceed.  Telemedicine visit is a necessity given the COVID-19 restrictions in place at the current time.  HPI: 59 y/o WM with whom I am doing a telephone visit today (due to COVID-19 pandemic restrictions->telemed applic failed) for 6 wk f/u HTN, HLD, DM2. At CPE visit 11/25/19: "HTN: add amlodipine 5mg  to current regimen". I also added zetia to his statin at that time b/c LDL up to 134. He no-showed for f/u 1 mo later.  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 No prob from taking amlod.  He is taking the zetia along with his statin w/out any side effects.  Compliant with metformin, working on TLC. CBG at home this morning was 98.   ROS: no CP, no SOB, no wheezing, no cough, no dizziness, no HAs, no rashes, no melena/hematochezia.  No polyuria or polydipsia.  No myalgias or arthralgias.   Past Medical History:  Diagnosis Date  . Coronary artery disease    DES to RCA 2010 in setting of acute event; repeat cath 03/02/10 for chest pain showed widely patent stent and normal LV function.  . Diabetes mellitus without complication ([facility]) 2019  . Diverticular disease    h/o 'itis  . Fatty liver   . Genital warts 06/23/2013  . History of pancreatitis   . History of peptic ulcer disease   . Hyperlipidemia    Atorva 80. Recommended adding zetia 11/2019  . Hypertension   . Mood disorder ([facility])    per old records; anger and irritability + elevated libido--improved on  sertraline.  . Renal artery stenosis ([facility]) 2011   Discordant data (mild bilat ostial disease on distal aortogram at time of cath, renal arteries appeared normal.  Then renal doppler u/s showed R>L RAS, renal sizes similar.  Recheck of renal artery dopplers 06/2013 showed no significant changes--repeat in 12 months recommended.  . Right flank pain    apparently had a trigger point b/c once the orthopedist injected the region he had no further pain.  (extensive w/u done for this problem prior to the injection, though).    Past Surgical History:  Procedure Laterality Date  . COLONOSCOPY  2007   Normal per Eric Hunter.  Recall 2017 (done in [place], [place]).  . CORONARY STENT PLACEMENT  09/14/09   RCA (Dr. 11/14/09)  . KNEE SURGERY  2007   Rt knee arthroscopy  . KNEE SURGERY  1983   Bilat knee debridement  . PALATE SURGERY  age 83 mo  . TYMPANOSTOMY TUBE PLACEMENT  1967    Family History  Problem Relation Age of Onset  . Heart attack Father   . Hypertension Father   . Heart attack Sister   . Hypertension Sister   . Heart attack Brother   . Hypertension Brother   . Colon cancer Brother   . Diabetes Neg Hx     SOCIAL HX:  Social History   Socioeconomic History  . Marital status: Married    Spouse name: Not on file  . Number of children: Not on file  .  Years of education: Not on file  . Highest education level: Not on file  Occupational History  . Not on file  Tobacco Use  . Smoking status: Former Smoker    Types: Pipe  . Smokeless tobacco: Never Used  . Tobacco comment: Pipe Only.  Substance and Sexual Activity  . Alcohol use: No  . Drug use: Yes    Types: Marijuana  . Sexual activity: Not on file  Other Topics Concern  . Not on file  Social History Narrative   Married, no children.   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   Education: BS from [employer].   No tobacco.  Rare use of marijuana (recreational).  No alcohol  or drugs (other than marij).   Exercise: yard work, Systems analyst, [employer] and horseback riding.   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         Social Determinants of Health   Financial Resource Strain:   . Difficulty of Paying Living Expenses: Not on file  Food Insecurity:   . Worried About Programme researcher, broadcasting/film/video in the Last Year: Not on file  . Ran Out of Food in the Last Year: Not on file  Transportation Needs:   . Lack of Transportation (Medical): Not on file  . Lack of Transportation (Non-Medical): Not on file  Physical Activity:   . Days of Exercise per Week: Not on file  . Minutes of Exercise per Session: Not on file  Stress:   . Feeling of Stress : Not on file  Social Connections:   . Frequency of Communication with Friends and Family: Not on file  . Frequency of Social Gatherings with Friends and Family: Not on file  . Attends Religious Services: Not on file  . Active Member of Clubs or Organizations: Not on file  . Attends Banker Meetings: Not on file  . Marital Status: Not on file      Current Outpatient Medications:  .  amLODipine (NORVASC) 5 MG tablet, TAKE 1 TABLET BY MOUTH EVERY DAY, Disp: 30 tablet, Rfl: 1 .  aspirin 325 MG tablet, Take 325 mg by mouth daily., Disp: , Rfl:  .  atorvastatin (LIPITOR) 80 MG tablet, Take 1 tablet (80 mg total) by mouth daily., Disp: 90 tablet, Rfl: 3 .  cetirizine (ZYRTEC) 10 MG tablet, Take 1 tablet (10 mg total) by mouth daily., Disp: 30 tablet, Rfl: 11 .  ezetimibe (ZETIA) 10 MG tablet, Take 1 tablet (10 mg total) by mouth daily., Disp: 30 tablet, Rfl: 2 .  hydrochlorothiazide (HYDRODIURIL) 25 MG tablet, Take 1 tablet (25 mg total) by mouth daily., Disp: 90 tablet, Rfl: 3 .  losartan (COZAAR) 100 MG tablet, Take 1 tablet (100 mg total) by mouth daily., Disp: 90 tablet, Rfl: 3 .  metFORMIN (GLUCOPHAGE) 500 MG tablet, Take 1 tablet (500 mg total) by mouth 2 (two)  times daily with a meal., Disp: 180 tablet, Rfl: 3 .  metoprolol succinate (TOPROL-XL) 100 MG 24 hr tablet, Take with or immediately following a meal., Disp: 90 tablet, Rfl: 3 .  fluticasone (FLONASE) 50 MCG/ACT nasal spray, Place 2 sprays into both nostrils daily. (Patient not taking: Reported on 11/25/2019), Disp: 16 g, Rfl: 6 .  hydrocortisone (ANUSOL-HC) 2.5 % rectal cream, Place 1 application rectally 2 (two) times daily. (Patient not taking: Reported on 04/25/2019), Disp:  30 g, Rfl: 2 .  nitroGLYCERIN (NITROSTAT) 0.4 MG SL tablet, Place 1 tablet (0.4 mg total) under the tongue every 5 (five) minutes as needed. (Patient not taking: Reported on 04/25/2019), Disp: 30 tablet, Rfl: 11  EXAM:  VITALS per patient if applicable: BP 628/36 (BP Location: Left Arm, Patient Position: Sitting, Cuff Size: Large)   Pulse 70     GENERAL: alert, oriented, sounds well and in no acute distress No further exam b/c audio visit only.  LABS: none today    Chemistry      Component Value Date/Time   NA 142 11/25/2019 1000   K 4.5 11/25/2019 1000   CL 103 11/25/2019 1000   CO2 30 11/25/2019 1000   BUN 14 11/25/2019 1000   CREATININE 1.26 11/25/2019 1000   CREATININE 1.23 05/09/2019 0949      Component Value Date/Time   CALCIUM 10.3 11/25/2019 1000   ALKPHOS 33 (L) 11/25/2019 1000   AST 16 11/25/2019 1000   ALT 19 11/25/2019 1000   BILITOT 0.9 11/25/2019 1000     Lab Results  Component Value Date   CHOL 202 (H) 11/25/2019   HDL 41.00 11/25/2019   LDLCALC 134 (H) 11/25/2019   LDLDIRECT 115.2 07/09/2014   TRIG 136.0 11/25/2019   CHOLHDL 5 11/25/2019   Lab Results  Component Value Date   HGBA1C 6.8 (H) 11/25/2019   ASSESSMENT AND PLAN:  Discussed the following assessment and plan:  1) Uncontrolled HTN: bp not too bad, though, and he's feeling fine. Increase amlodipine to 10mg  qd, cont working on TLC. Repeat lytes/cr in 3 mo.  2) Hyperchol: continue zetia and atorva, plan recheck lipids  and hepatic panel in 3 mo.  3) DM 2: stable. No changes. Next a1c in 3 mo.   Spent 20 min with Eric Hunter today, with >50% of this time spent in counseling and care coordination regarding the above problems.  I discussed the assessment and treatment plan with the patient. The patient was provided an opportunity to ask questions and all were answered. The patient agreed with the plan and demonstrated an understanding of the instructions.   The patient was advised to call back or seek an in-person evaluation if the symptoms worsen or if the condition fails to improve as anticipated.  F/u: 3 mo in person, lab visit 1 week prior  Signed:  Crissie Sickles, MD           01/08/2020

## 2020-02-06 ENCOUNTER — Telehealth: Payer: Self-pay

## 2020-02-06 NOTE — Telephone Encounter (Signed)
Records printed patient made aware to pick up at his convenience.

## 2020-02-06 NOTE — Telephone Encounter (Signed)
Patient is requesting a copy of his vaccine record that we have on file for his new job. He would like to pick it up this afternoon if possible. Please call patient's cell when ready for pick up.

## 2020-02-25 ENCOUNTER — Other Ambulatory Visit: Payer: Self-pay | Admitting: Family Medicine

## 2020-03-03 ENCOUNTER — Other Ambulatory Visit: Payer: Self-pay | Admitting: Family Medicine

## 2020-11-30 ENCOUNTER — Other Ambulatory Visit: Payer: Self-pay | Admitting: Family Medicine

## 2020-12-01 ENCOUNTER — Other Ambulatory Visit: Payer: Self-pay | Admitting: Family Medicine

## 2020-12-27 ENCOUNTER — Ambulatory Visit: Payer: 59 | Admitting: Family Medicine

## 2020-12-27 ENCOUNTER — Other Ambulatory Visit: Payer: Self-pay | Admitting: Family Medicine

## 2021-01-03 ENCOUNTER — Other Ambulatory Visit: Payer: Self-pay | Admitting: Family Medicine

## 2021-01-04 ENCOUNTER — Other Ambulatory Visit: Payer: Self-pay | Admitting: Family Medicine

## 2021-01-26 ENCOUNTER — Other Ambulatory Visit: Payer: Self-pay | Admitting: Family Medicine

## 2021-01-30 ENCOUNTER — Other Ambulatory Visit: Payer: Self-pay | Admitting: Family Medicine

## 2021-02-26 ENCOUNTER — Other Ambulatory Visit: Payer: Self-pay | Admitting: Family Medicine

## 2021-03-11 ENCOUNTER — Encounter: Payer: Self-pay | Admitting: Family Medicine

## 2021-03-11 ENCOUNTER — Ambulatory Visit (HOSPITAL_BASED_OUTPATIENT_CLINIC_OR_DEPARTMENT_OTHER)
Admission: RE | Admit: 2021-03-11 | Discharge: 2021-03-11 | Disposition: A | Discharge status: Home / Self Care | Payer: 59 | Source: Ambulatory Visit | Attending: Family Medicine | Admitting: Family Medicine

## 2021-03-11 ENCOUNTER — Other Ambulatory Visit: Payer: Self-pay

## 2021-03-11 ENCOUNTER — Ambulatory Visit (INDEPENDENT_AMBULATORY_CARE_PROVIDER_SITE_OTHER): Payer: Self-pay | Admitting: Family Medicine

## 2021-03-11 VITALS — BP 150/84 | HR 82 | Temp 97.8°F | Resp 16 | Ht 67.0 in | Wt 218.0 lb

## 2021-03-11 DIAGNOSIS — M545 Low back pain, unspecified: Secondary | ICD-10-CM

## 2021-03-11 DIAGNOSIS — M25551 Pain in right hip: Secondary | ICD-10-CM | POA: Insufficient documentation

## 2021-03-11 DIAGNOSIS — R21 Rash and other nonspecific skin eruption: Secondary | ICD-10-CM

## 2021-03-11 LAB — POCT URINALYSIS DIPSTICK
Bilirubin, UA: NEGATIVE
Blood, UA: NEGATIVE
Glucose, UA: NEGATIVE
Ketones, UA: NEGATIVE
Leukocytes, UA: NEGATIVE
Nitrite, UA: NEGATIVE
Protein, UA: NEGATIVE
Spec Grav, UA: >=1.03 — AB (ref 1.010–1.025)
Urobilinogen, UA: 0.2 E.U./dL
pH, UA: 5.5 (ref 5.0–8.0)

## 2021-03-11 MED ORDER — METAXALONE 800 MG PO TABS: ORAL_TABLET | ORAL | 1 refills | Status: DC

## 2021-03-11 MED ORDER — PREDNISONE 10 MG PO TABS: ORAL_TABLET | ORAL | 0 refills | Status: DC

## 2021-03-11 MED ORDER — CEPHALEXIN 500 MG PO CAPS: 500.0000 mg | ORAL_CAPSULE | Freq: Three times a day (TID) | ORAL | 0 refills | Status: DC

## 2021-03-11 NOTE — Progress Notes (Signed)
OFFICE VISIT  03/11/2021  CC:  Chief Complaint  Patient presents with  . Low back pain    Acute x 6 days, dorsal/ventral region radiating to iliac crest and migrating down to scrotum.    HPI:    Patient is a 60 y.o. Caucasian male with CAD, DM, HTN, and HLD who presents for low back pain. His last "routine" f/u was about 14 months ago---virtual/telemedicine.  Onset about 1 wk ago pain in R LB, rad around R hip iliac crest region and to greater troch, lately now in R groin region.  Hip movements exacerbate it.  No tingling or numbness or leg weakness.  No testicle pain or swelling.  No dysuria or hematuria. Eating and drinking fine.  No f/c/ malaise. Taking tylenol and naproxen--has had to miss 2 days of work. No trauma or strain preceding all this.   Rash R ankle x 1 mo, started as little red itchy splotch.  Tried otc lotrimin no help.  Has been scratching it, says it is quite itchy, no pain.  Has spread signif on R ankle and now has some on L LL as well.  ROS as above, plus--> no fevers, no CP, no SOB, no wheezing, no cough, no dizziness, no HAs, no melena/hematochezia.  No polyuria or polydipsia.  No myalgias or arthralgias.  No focal weakness, paresthesias, or tremors.  No acute vision or hearing abnormalities.  No dysuria or unusual/new urinary urgency or frequency.  No recent changes in lower legs. No n/v/d or abd pain.  No palpitations.    Past Medical History:  Diagnosis Date  . Coronary artery disease    DES to RCA 2010 in setting of acute event; repeat cath 03/02/10 for chest pain showed widely patent stent and normal LV function.  . Diabetes mellitus without complication ([facility]) 2019  . Diverticular disease    h/o 'itis  . Fatty liver   . Genital warts 06/23/2013  . History of pancreatitis   . History of peptic ulcer disease   . Hyperlipidemia    Atorva 80. Recommended adding zetia 11/2019  . Hypertension   . Mood disorder ([facility])    per old records; anger and irritability  + elevated libido--improved on sertraline.  . Renal artery stenosis ([facility]) 2011   Discordant data (mild bilat ostial disease on distal aortogram at time of cath, renal arteries appeared normal.  Then renal doppler u/s showed R>L RAS, renal sizes similar.  Recheck of renal artery dopplers 06/2013 showed no significant changes--repeat in 12 months recommended.  . Right flank pain    apparently had a trigger point b/c once the orthopedist injected the region he had no further pain.  (extensive w/u done for this problem prior to the injection, though).    Past Surgical History:  Procedure Laterality Date  . COLONOSCOPY  2007   Normal per pt.  Recall 2017 (done in [place], [place]).  . CORONARY STENT PLACEMENT  09/14/09   RCA (Dr. Clarene Duke)  . KNEE SURGERY  2007   Rt knee arthroscopy  . KNEE SURGERY  1983   Bilat knee debridement  . PALATE SURGERY  age 70 mo  . TYMPANOSTOMY TUBE PLACEMENT  1967    Outpatient Medications Prior to Visit  Medication Sig Dispense Refill  . amLODipine (NORVASC) 10 MG tablet TAKE 1 TABLET BY MOUTH EVERY DAY 14 tablet 0  . aspirin 325 MG tablet Take 325 mg by mouth daily.    Marland Kitchen atorvastatin (LIPITOR) 80 MG tablet Take 1  tablet (80 mg total) by mouth daily. 90 tablet 3  . cetirizine (ZYRTEC) 10 MG tablet Take 1 tablet (10 mg total) by mouth daily. 30 tablet 11  . fluticasone (FLONASE) 50 MCG/ACT nasal spray Place 2 sprays into both nostrils daily. 16 g 6  . hydrochlorothiazide (HYDRODIURIL) 25 MG tablet TAKE 1 TABLET BY MOUTH EVERY DAY 14 tablet 0  . hydrocortisone (ANUSOL-HC) 2.5 % rectal cream Place 1 application rectally 2 (two) times daily. 30 g 2  . losartan (COZAAR) 100 MG tablet TAKE 1 TABLET BY MOUTH EVERY DAY 30 tablet 0  . metFORMIN (GLUCOPHAGE) 500 MG tablet TAKE 1 TABLET (500 MG TOTAL) BY MOUTH 2 (TWO) TIMES DAILY WITH A MEAL. 60 tablet 0  . metoprolol succinate (TOPROL-XL) 100 MG 24 hr tablet TAKE ONE TAB BY MOUTH WITH OR IMMEDIATELY FLLOWING MEAL 14 tablet 0   . ezetimibe (ZETIA) 10 MG tablet TAKE 1 TABLET BY MOUTH EVERY DAY (Patient not taking: Reported on 03/11/2021) 30 tablet 0  . nitroGLYCERIN (NITROSTAT) 0.4 MG SL tablet Place 1 tablet (0.4 mg total) under the tongue every 5 (five) minutes as needed. (Patient not taking: Reported on 03/11/2021) 30 tablet 11   No facility-administered medications prior to visit.    Allergies  Allergen Reactions  . Tramadol     Per pt: legs kick, trouble coming off  . Penicillins Rash and Other (See Comments)    Angio-edema    ROS As per HPI  PE: Vitals with BMI 03/11/2021 01/08/2020 11/25/2019  Height 5\' 7"  - 5\' 7"   Weight 218 lbs - 221 lbs 10 oz  BMI 34.14 - 34.7  Systolic 150 130  Diastolic 84 88 90  Pulse 82 70 53    Gen: Alert, well appearing.  Patient is oriented to person, place, time, and situation. AFFECT: pleasant, lucid thought and speech. L spine ROM intact w/out signif exac of R LB or hip pain. Diffuse TTP over R L/S spine soft tissues and R hip and thigh to level of knee.  ROM of R hip somewhat limited by pain in all motions.   LE strength 5/5 prox and dist bilat.  Sitting SLR neg bilat. Patellar DTR trace on R, 2+ on L. Achilles DTR trace bilat.  LL's: skin with scattered round maculopapular rash with a few areas of very superficial desquamation/weeping.  R LL >>L LL.  No vesicles or pustules or streaking.  Trace bilat LL pitting edema.  LABS:    Chemistry      Component Value Date/Time   NA 142 11/25/2019 1000   K 4.5 11/25/2019 1000   CL 103 11/25/2019 1000   CO2 30 11/25/2019 1000   BUN 14 11/25/2019 1000   CREATININE 1.26 11/25/2019 1000   CREATININE 1.23 05/09/2019 0949      Component Value Date/Time   CALCIUM 10.3 11/25/2019 1000   ALKPHOS 33 (L) 11/25/2019 1000   AST 16 11/25/2019 1000   ALT 19 11/25/2019 1000   BILITOT 0.9 11/25/2019 1000     Lab Results  Component Value Date   HGBA1C 6.8 (H) 11/25/2019   POC CC dipstick UA today was  NORMAL.  IMPRESSION AND PLAN:  1) Acute R LB/R hip pain: unclear etiology. Check plain films of L spine and R hip/pelvis. Refer to PT--ordered today. Trial of robaxin 800 mg tid prn.  No nsaids since I'm getting him on prednisone for his rash---we'll see how the prednisone affects his LB/hip pain as well. Pt declines rx  pain meds. He declined referral to orthopedics at this time.  2) Rash, nonspecific dermatitis with likely superimposed bacterial infection. Prednisone taper: 40 qd x 5d, 30 qd x 4, 20 qd x 3, 10 qd x 2. Keflex 500mg  tid x 7d.  An After Visit Summary was printed and given to the patient.  FOLLOW UP: Return in about 2 weeks (around 03/25/2021) for f/u R hip; legs rash.  Signed:  03/27/2021, MD           03/11/2021

## 2021-03-14 ENCOUNTER — Encounter: Payer: Self-pay | Admitting: Family Medicine

## 2021-03-17 ENCOUNTER — Telehealth: Payer: Self-pay

## 2021-03-17 NOTE — Telephone Encounter (Signed)
Pt reports improvement with PT. Noted.

## 2021-03-17 NOTE — Telephone Encounter (Signed)
FYI. Please see below.  [facility] Day - Client Nonclinical Telephone Record  [facility] Client [facility] Day - Client Client Site [facility] - Day Physician Santiago Bumpers - MD Contact Type Call Who Is Calling Patient / Member / Family / Caregiver Caller Name Eric Hunter Caller Phone Number [phone] Patient Name Eric Hunter Patient DOB 14-Nov-1961 Call Type Message Only Information Provided Reason for Call Request for General Office Information Initial Comment Caller states he wanted to let Provider know that he has improved and PT has been making him feel improved Additional Comment hrs prov Disp. Time Disposition Final User 03/15/2021 5:04:31 PM General Information Provided Yes Pearletha Furl Call Closed By: Pearletha Furl Transaction Date/Time: 03/15/2021 5:02:02 PM (ET)

## 2021-03-28 ENCOUNTER — Ambulatory Visit: Payer: Self-pay | Admitting: Family Medicine

## 2021-03-29 ENCOUNTER — Telehealth: Payer: Self-pay

## 2021-03-29 ENCOUNTER — Encounter (HOSPITAL_BASED_OUTPATIENT_CLINIC_OR_DEPARTMENT_OTHER): Payer: Self-pay | Admitting: *Deleted

## 2021-03-29 ENCOUNTER — Emergency Department (HOSPITAL_BASED_OUTPATIENT_CLINIC_OR_DEPARTMENT_OTHER)
Admission: EM | Admit: 2021-03-29 | Discharge: 2021-03-29 | Disposition: A | Discharge status: Home / Self Care | Payer: 59 | Attending: Emergency Medicine | Admitting: Emergency Medicine

## 2021-03-29 ENCOUNTER — Other Ambulatory Visit: Payer: Self-pay

## 2021-03-29 DIAGNOSIS — E119 Type 2 diabetes mellitus without complications: Secondary | ICD-10-CM | POA: Diagnosis not present

## 2021-03-29 DIAGNOSIS — I10 Essential (primary) hypertension: Secondary | ICD-10-CM | POA: Insufficient documentation

## 2021-03-29 DIAGNOSIS — Z7982 Long term (current) use of aspirin: Secondary | ICD-10-CM | POA: Insufficient documentation

## 2021-03-29 DIAGNOSIS — I2510 Atherosclerotic heart disease of native coronary artery without angina pectoris: Secondary | ICD-10-CM | POA: Diagnosis not present

## 2021-03-29 DIAGNOSIS — Z79899 Other long term (current) drug therapy: Secondary | ICD-10-CM | POA: Insufficient documentation

## 2021-03-29 DIAGNOSIS — R21 Rash and other nonspecific skin eruption: Secondary | ICD-10-CM | POA: Insufficient documentation

## 2021-03-29 DIAGNOSIS — Z7984 Long term (current) use of oral hypoglycemic drugs: Secondary | ICD-10-CM | POA: Insufficient documentation

## 2021-03-29 DIAGNOSIS — Z87891 Personal history of nicotine dependence: Secondary | ICD-10-CM | POA: Diagnosis not present

## 2021-03-29 MED ORDER — DOXYCYCLINE HYCLATE 100 MG PO CAPS: 100.0000 mg | ORAL_CAPSULE | Freq: Two times a day (BID) | ORAL | 0 refills | Status: DC

## 2021-03-29 MED ORDER — METHYLPREDNISOLONE SODIUM SUCC 125 MG IJ SOLR
125.0000 mg | Freq: Once | INTRAMUSCULAR | Status: AC
  Administered 2021-03-29: 125 mg via INTRAMUSCULAR
  Filled 2021-03-29: qty 2

## 2021-03-29 MED ORDER — PREDNISONE 10 MG (21) PO TBPK: ORAL_TABLET | Freq: Every day | ORAL | 0 refills | Status: DC

## 2021-03-29 NOTE — ED Provider Notes (Signed)
[facility] EMERGENCY DEPARTMENT Provider Note   CSN: [id number] Arrival date & time: 03/29/21  2120     History Chief Complaint  Patient presents with  . Urticaria    Eric Hunter is a 60 y.o. male presenting for evaluation of rash.  Patient states that 2-1/2 weeks ago he developed a rash of his bilateral lower legs.  He saw his PCP for this, was prescribed steroids and Keflex.  Patient reports that steroids help with the itching, however a few days after taking the Keflex he developed a rash elsewhere on his body including his chest, arms, and back.  He was concerned this was allergy.  He finished all but one of the Keflex pills.  He states since then, he continues to have worsening itching of the rash.  He also has increased pain of his right lower leg that he original rash was present.  He denies fevers or chills.  No new medications beyond what is already described.  He denies new detergents, soaps, environments, clothes.  No involvement of the palms, soles, or mouth.  HPI     Past Medical History:  Diagnosis Date  . Coronary artery disease    DES to RCA 2010 in setting of acute event; repeat cath 03/02/10 for chest pain showed widely patent stent and normal LV function.  . DDD (degenerative disc disease), lumbar   . Diabetes mellitus without complication (HCC) 2019  . Diverticular disease    h/o 'itis  . Fatty liver   . Genital warts 06/23/2013  . History of pancreatitis   . History of peptic ulcer disease   . Hyperlipidemia    Atorva 80. Recommended adding zetia 11/2019  . Hypertension   . Mood disorder (HCC)    per old records; anger and irritability + elevated libido--improved on sertraline.  . Renal artery stenosis (HCC) 2011   Discordant data (mild bilat ostial disease on distal aortogram at time of cath, renal arteries appeared normal.  Then renal doppler u/s showed R>L RAS, renal sizes similar.  Recheck of renal artery dopplers 06/2013 showed no  significant changes--repeat in 12 months recommended.  . Right flank pain    apparently had a trigger point b/c once the orthopedist injected the region he had no further pain.  (extensive w/u done for this problem prior to the injection, though).    Patient Active Problem List   Diagnosis Date Noted  . Prediabetes 11/28/2016  . Insulin resistance 11/24/2016  . Uncontrolled hypertension 10/16/2014  . Contact dermatitis 08/10/2014  . Health maintenance examination 07/12/2014  . Genital warts 07/12/2014  . Obesity, unspecified 05/07/2014  . Renal artery stenosis (HCC) 06/23/2013  . HTN (hypertension), benign 11/06/2012  . Hyperlipemia, mixed 11/06/2012    Past Surgical History:  Procedure Laterality Date  . COLONOSCOPY  2007   Normal per pt.  Recall 2017 (done in [place], [place]).  . CORONARY STENT PLACEMENT  09/14/09   RCA (Dr. Clarene Duke)  . KNEE SURGERY  2007   Rt knee arthroscopy  . KNEE SURGERY  1983   Bilat knee debridement  . PALATE SURGERY  age 83 mo  . TYMPANOSTOMY TUBE PLACEMENT  1967       Family History  Problem Relation Age of Onset  . Heart attack Father   . Hypertension Father   . Heart attack Sister   . Hypertension Sister   . Heart attack Brother   . Hypertension Brother   . Colon cancer Brother   .  Diabetes Neg Hx     Social History   Tobacco Use  . Smoking status: Former Smoker    Types: Pipe  . Smokeless tobacco: Never Used  . Tobacco comment: Pipe Only.  Vaping Use  . Vaping Use: Never used  Substance Use Topics  . Alcohol use: No  . Drug use: Yes    Types: Marijuana    Home Medications Prior to Admission medications   Medication Sig Start Date End Date Taking? Authorizing Provider  doxycycline (VIBRAMYCIN) 100 MG capsule Take 1 capsule (100 mg total) by mouth 2 (two) times daily. 03/29/21  Yes Deja Pisarski, PA-C  predniSONE (STERAPRED UNI-PAK 21 TAB) 10 MG (21) TBPK tablet Take by mouth daily. Take 6 tabs by mouth daily  for 2 days,  then 5 tabs for 2 days, then 4 tabs for 2 days, then 3 tabs for 2 days, 2 tabs for 2 days, then 1 tab by mouth daily for 2 days 03/29/21  Yes Liara Holm, PA-C  amLODipine (NORVASC) 10 MG tablet TAKE 1 TABLET BY MOUTH EVERY DAY 02/28/21   McGowen, Maryjean Morn, MD  aspirin 325 MG tablet Take 325 mg by mouth daily.    [provider]  atorvastatin (LIPITOR) 80 MG tablet Take 1 tablet (80 mg total) by mouth daily. 11/25/19   McGowen, Maryjean Morn, MD  cephALEXin (KEFLEX) 500 MG capsule Take 1 capsule (500 mg total) by mouth 3 (three) times daily. 03/11/21   McGowen, Maryjean Morn, MD  cetirizine (ZYRTEC) 10 MG tablet Take 1 tablet (10 mg total) by mouth daily. 04/25/19   McGowen, Maryjean Morn, MD  ezetimibe (ZETIA) 10 MG tablet TAKE 1 TABLET BY MOUTH EVERY DAY Patient not taking: Reported on 03/11/2021 02/25/20   Jeoffrey Massed, MD  fluticasone (FLONASE) 50 MCG/ACT nasal spray Place 2 sprays into both nostrils daily. 04/25/19   McGowen, Maryjean Morn, MD  hydrochlorothiazide (HYDRODIURIL) 25 MG tablet TAKE 1 TABLET BY MOUTH EVERY DAY 02/28/21   McGowen, Maryjean Morn, MD  hydrocortisone (ANUSOL-HC) 2.5 % rectal cream Place 1 application rectally 2 (two) times daily. 04/04/17   McGowen, Maryjean Morn, MD  losartan (COZAAR) 100 MG tablet TAKE 1 TABLET BY MOUTH EVERY DAY 12/01/20   McGowen, Maryjean Morn, MD  metaxalone (SKELAXIN) 800 MG tablet 1 tab po tid prn for muscle relaxation 03/11/21   McGowen, Maryjean Morn, MD  metFORMIN (GLUCOPHAGE) 500 MG tablet TAKE 1 TABLET (500 MG TOTAL) BY MOUTH 2 (TWO) TIMES DAILY WITH A MEAL. 11/30/20   McGowen, Maryjean Morn, MD  metoprolol succinate (TOPROL-XL) 100 MG 24 hr tablet TAKE ONE TAB BY MOUTH WITH OR IMMEDIATELY FLLOWING MEAL 02/28/21   McGowen, Maryjean Morn, MD  nitroGLYCERIN (NITROSTAT) 0.4 MG SL tablet Place 1 tablet (0.4 mg total) under the tongue every 5 (five) minutes as needed. Patient not taking: Reported on 03/11/2021 04/11/13   Jeoffrey Massed, MD    Allergies    Keflex [cephalexin], Tramadol,  and Penicillins  Review of Systems   Review of Systems  Skin: Positive for rash.  All other systems reviewed and are negative.   Physical Exam Updated Vital Signs BP (!) 182/97 (BP Location: Left Arm)   Pulse 72   Temp 98.6 F (37 C) (Oral)   Resp 20   Ht 5\' 7"  (1.702 m)   Wt 98.9 kg   SpO2 98%   BMI 34.15 kg/m   Physical Exam Vitals and nursing note reviewed.  Constitutional:      General:  He is not in acute distress.    Appearance: He is well-developed.  HENT:     Head: Normocephalic and atraumatic.  Eyes:     Conjunctiva/sclera: Conjunctivae normal.     Pupils: Pupils are equal, round, and reactive to light.  Cardiovascular:     Rate and Rhythm: Normal rate and regular rhythm.     Pulses: Normal pulses.  Pulmonary:     Effort: Pulmonary effort is normal. No respiratory distress.     Breath sounds: Normal breath sounds. No wheezing.  Abdominal:     General: Bowel sounds are normal. There is no distension.     Palpations: Abdomen is soft.     Tenderness: There is no abdominal tenderness.  Musculoskeletal:        General: Normal range of motion.     Cervical back: Normal range of motion and neck supple.  Skin:    General: Skin is warm and dry.     Capillary Refill: Capillary refill takes less than 2 seconds.     Findings: Rash present.  Neurological:     Mental Status: He is alert and oriented to person, place, and time.                     ED Results / Procedures / Treatments   Labs (all labs ordered are listed, but only abnormal results are displayed) Labs Reviewed - No data to display  EKG None  Radiology No results found.  Procedures Procedures   Medications Ordered in ED Medications  methylPREDNISolone sodium succinate (SOLU-MEDROL) 125 mg/2 mL injection 125 mg (has no administration in time range)    ED Course  I have reviewed the triage vital signs and the nursing notes.  Pertinent labs & imaging results that were  available during my care of the patient were reviewed by me and considered in my medical decision making (see chart for details).    MDM Rules/Calculators/A&P                          Pt presenting for evaluation of rash. On exam, pt appears nontoxic.  He does have a widespread rash which is mostly dry and scaly.  He reports that it is very itchy.  He is also having pain of his right lower leg where the rash is cracked and weeping.  Concern for superimposed infection in the setting of worsening pain and symptoms.  Due to the widespread itchy nature of the rash, will also treat with steroids.  However ultimately, patient will need to follow-up with dermatology for further evaluation and management. Solumedrol given in the ED. Case discussed with attending, Dr. Anitra Lauth agrees to plan. At this time, pt appears safe for d/c. Return precautions given. Pt states he understands and agrees to plan.   Final Clinical Impression(s) / ED Diagnoses Final diagnoses:  Rash and nonspecific skin eruption    Rx / DC Orders ED Discharge Orders         Ordered    predniSONE (STERAPRED UNI-PAK 21 TAB) 10 MG (21) TBPK tablet  Daily        03/29/21 2234    doxycycline (VIBRAMYCIN) 100 MG capsule  2 times daily        03/29/21 2234           Alveria Apley, PA-C 03/29/21 2308    Gwyneth Sprout, MD 03/29/21 2334

## 2021-03-29 NOTE — Telephone Encounter (Signed)
Please advise, pt stated from prior note that he had a reaction to medication.

## 2021-03-29 NOTE — Telephone Encounter (Signed)
Patient refill request.  Dr. Milinda Cave is treating patient for rash on ankle.  Rash is still present.   cephALEXin (KEFLEX) 500 MG capsule [[id number]]   [id number] [place], 

## 2021-03-29 NOTE — Telephone Encounter (Signed)
Updated chart   [facility] Day - Client Nonclinical Telephone Record  [facility] Client [facility] Day - Client Client Site [facility] [place] - Day Physician Santiago Bumpers - MD Contact Type Call Who Is Calling Patient / Member / Family / Caregiver Caller Name [name] Caller Phone Number [phone] Patient Name Eric Hunter Patient DOB February 11, 1961 Call Type Message Only Information Provided Reason for Call Request for General Office Information Initial Comment Caller is on Keflex and he had a reaction to the medication but he finished the full course -- he refused Triage but he wanted to let the office know -- Caller states he's doing fine now but just wanted it noted in his chart Disp. Time Disposition Final User 03/25/2021 7:54:09 AM General Information Provided Yes Tiburcio Pea, Lanette Call Closed By: Evette Doffing Transaction Date/Time: 03/25/2021 7:50:10 AM (ET

## 2021-03-29 NOTE — Discharge Instructions (Signed)
Take prednisone and antibiotics as prescribed.  Take the entire course, even if symptoms improve. It is important that you follow-up with a dermatologist for further evaluation of your symptoms.  You may call the offices listed below to set up an appointment or reach out to your primary care doctor to help facilitate a referral. Return to the emergency room if you develop high fevers, vomiting, blistering or involvement of the rash of your lips/mouth, or with any new, worsening, or concerning symptoms.

## 2021-03-29 NOTE — ED Notes (Signed)
ED Provider at bedside. 

## 2021-03-29 NOTE — ED Triage Notes (Signed)
He is being treated for cellulitis on his lower legs with Keflex. Rash on is chest and arms x 3 days. He feels the rash is a reaction to  the Keflex.

## 2021-03-29 NOTE — Telephone Encounter (Signed)
Need more info: is the rash improved any?  Any picture he can send?

## 2021-03-30 NOTE — Telephone Encounter (Signed)
LM for pt to return call regarding referral  

## 2021-03-30 NOTE — Telephone Encounter (Signed)
[facility] and [facility] are the ones I refer to but either way the wait is usually a couple months.If he still wants referral to one they pls order to the one he chooses with dx of rash-thx

## 2021-03-30 NOTE — Telephone Encounter (Signed)
Spoke with patient and he states he will discuss with wife, she already has a dermatology office currently using. If things change or he changes his mind, advised we could place referral.

## 2021-03-30 NOTE — Telephone Encounter (Addendum)
He went to [facility] ED last night, received shot of solu-medrol 125mg  IM and rx for doxycycline and prednisone taper. Rash is not abating but under control. They mentioned a couple of dermatologist offices recommended but would prefer PCP opinion of recommendations for dermatologist.    Please advise, thanks.

## 2021-04-04 ENCOUNTER — Telehealth: Payer: Self-pay

## 2021-04-04 ENCOUNTER — Other Ambulatory Visit: Payer: Self-pay

## 2021-04-04 MED ORDER — AMLODIPINE BESYLATE 10 MG PO TABS: 1.0000 | ORAL_TABLET | Freq: Every day | ORAL | 0 refills | Status: DC

## 2021-04-04 MED ORDER — METOPROLOL SUCCINATE ER 100 MG PO TB24: ORAL_TABLET | ORAL | 0 refills | Status: DC

## 2021-04-04 MED ORDER — HYDROCHLOROTHIAZIDE 25 MG PO TABS: 1.0000 | ORAL_TABLET | Freq: Every day | ORAL | 0 refills | Status: DC

## 2021-04-04 NOTE — Telephone Encounter (Signed)
30 d/s sent. LVM for pt to CB regarding refill request.  Note: Pt is due for f/u

## 2021-04-04 NOTE — Telephone Encounter (Signed)
LM for pt to return call regarding rx refills. 30 day supply sent for all blood pressure medications earlier today.

## 2021-04-04 NOTE — Telephone Encounter (Signed)
Patient returned call regarding rx, advised meds sent for 30 day supply until 05/09 appt. He wanted to know if labs would be done. Advised this would be a possibility. Verified pharmacy meds were sent to.

## 2021-04-04 NOTE — Telephone Encounter (Signed)
OK to authorize 30 d supply of each (hctz and metop and amlodipine)---f/u as appropriate.-thx

## 2021-04-04 NOTE — Telephone Encounter (Signed)
See other encounter.

## 2021-04-04 NOTE — Telephone Encounter (Signed)
Please advise if 30 d supply appropriate for bp meds.  [facility] Day - Client TELEPHONE ADVICE RECORD [facility] Patient Name: Eric Hunter [facility] Gender: Male DOB: 02/02/1961 Age: 60 Y 17 D Return Phone Number: [phone] (Primary), [phone] (Secondary) Address: City/ State/ Zip: [place]  [address] Client [facility] Day - Client Client Site [facility] [place] - Day Physician Santiago Bumpers - MD Contact Type Call Who Is Calling Patient / Member / Family / Caregiver Call Type Triage / Clinical Caller Name Mohamedamin Nifong Relationship To Patient Partner Return Phone Number [phone] (Primary) Chief Complaint Headache Reason for Call Symptomatic / Request for Health Information Initial Comment caller states her husband had a recent office visit but they have not renewed medications. He is out of his hydrochlorothiazide 25mg  and metoprolol succinate er 100mg  They are requesting a loaner dose. He is having headaches now Translation No Nurse Assessment Nurse: , RN, Date/Time (Eastern Time): 04/02/2021 1:41:29 PM Confirm and document reason for call. If symptomatic, describe symptoms. ---Caller states her husband had a recent office visit but they have not renewed medications. He is out of his hydrochlorothiazide 25mg  and metoprolol succinate er 100mg  They are requesting a loaner dose. He is having headaches now. 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 He has been out of medication for a while, not sure how long. Does the patient have any new or worsening symptoms? ---Yes Will a triage be completed? ---Yes Related visit to physician within the last 2 weeks? ---Yes Does the PT have any chronic conditions? (i.e. diabetes, asthma, this includes High risk factors for pregnancy, etc.) ---Yes List chronic conditions. ---HTN Is this a behavioral health or substance abuse call?  ---No Nurse: Dyke Maes, RN, 04/04/2021 Date/Time (Eastern Time): 04/02/2021 1:49:00 PM Please select the assessment type ---Refill Additional Documentation ---[facility] Does the patient have enough medication to last until the office opens? ---No PLEASE NOTE: All timestamps contained within this report are represented as Warren Danes Standard Time. CONFIDENTIALTY NOTICE: This fax transmission is intended only for the addressee. It contains information that is legally privileged, confidential or otherwise protected from use or disclosure. If you are not the intended recipient, you are strictly prohibited from reviewing, disclosing, copying using or disseminating any of this information or taking any action in reliance on or regarding this information. If you have received this fax in error, please notify Dyke Maes immediately by telephone so that we can arrange for its return to 04/04/2021. Phone: [phone], Toll-Free: [phone], Fax: [phone] Page: 2 of 2 Call Id: [place] Nurse Assessment Guidelines Guideline Title Affirmed Question Affirmed Notes Nurse Date/Time [phone] Time) Headache [1] MODERATE headache (e.g., interferes with normal activities) AND [2] present > 24 hours AND [3] unexplained (Exceptions: analgesics not tried, typical migraine, or headache part of viral illness) [phone], RN, Jana 04/02/2021 1:43:35 PM Disp. Time [id number] Time) Disposition Final User 04/02/2021 1:45:23 PM See PCP within 24 Hours Yes Warren Danes, RN, 04/04/2021 Caller Disagree/Comply Comply Caller Understands Yes PreDisposition Home Care Care Advice Given Per Guideline SEE PCP WITHIN 24 HOURS: PAIN MEDICINES: * For pain relief, you can take either acetaminophen, ibuprofen, or naproxen. LOCAL COLD: * Apply a cold wet washcloth or cold pack to the forehead for 20 minutes. CALL BACK IF: * You become worse Referrals GO TO FACILITY UNDECIDED

## 2021-04-04 NOTE — Addendum Note (Signed)
Addended by: Maxie Barb on: 04/04/2021 10:16 AM   Modules accepted: Orders

## 2021-04-04 NOTE — Telephone Encounter (Signed)
Pt called stating his appt was canceled last week due to provider being out of office and was wanting to get rescheduled.  He needs refills on amlodopine, toprol-xl & hydrochlorothiazide.  In looking at past appts, I noticed pt had and ER visit.  Pt stated he had a reaction to keflex and is much better now.  Pt is scheduled for med refill/ER f/up on 04/18/21, but is need of enough of the abovementioned meds to get him through until he can be seen for his appt.

## 2021-04-12 ENCOUNTER — Telehealth: Payer: Self-pay

## 2021-04-12 NOTE — Telephone Encounter (Signed)
Caller spoke with a triage nurse, and they told him to make an appt. within 2-3 days. She also stated that it was an option to continue antibiotic therapy until his already scheduled appt on May 9th.   Please Advise if sooner appt needed.  [facility] Day - Client TELEPHONE ADVICE RECORD [facility] Patient Name: Eric Hunter Gender: Male DOB: 10/17/1961 Age: 60 Y 54 D Return Phone Number: [phone] (Primary), [phone] (Secondary) Address: City/ State/ Zip: [place]  [address] Client [facility] Day - Client Client Site  [facility] [employer] - Day Physician Santiago Bumpers - MD Contact Type Call Who Is Calling Patient / Member / Family / Caregiver Call Type Triage / Clinical Relationship To Patient Self Return Phone Number [phone] (Primary) Chief Complaint Wound Infection Reason for Call Symptomatic / Request for Health Information Initial Comment Caller states he has a leg infection. He has been treated for it and he wants to know if he should be on antibiotics longer. He is having tenderness and it itches. It is red and a little warmer than usual. Translation No Nurse Assessment Nurse: Kizzie Bane, RN, Marylene Land Date/Time (Eastern Time): 04/12/2021 12:12:22 PM Confirm and document reason for call. If symptomatic, describe symptoms. ---Caller states she has been treated for cellulitus on his right leg. He has finished the doxycycline yesterday. No fever but he still has some tenderness to the touch and it is warm to the touch. There is still redness there Does the patient have any new or worsening symptoms? ---Yes Will a triage be completed? ---Yes Related visit to physician within the last 2 weeks? ---No Does the PT have any chronic conditions? (i.e. diabetes, asthma, this includes High risk factors for pregnancy, etc.) ---No Is this a behavioral health or substance abuse call? ---No Guidelines Guideline  Title Affirmed Question Affirmed Notes Nurse Date/Time (Eastern Time) Cellulitis on Antibiotic Follow-up Call [1] Finished taking antibiotic AND [2] cellulitis symptoms are BETTER (e.g., redness, pain drainage, swelling) BUT [3] not completely gone Kizzie Bane, Charity fundraiser, Marylene Land 04/12/2021 12:15:03 PM PLEASE NOTE: All timestamps contained within this report are represented as [place] Standard Time. CONFIDENTIALTY NOTICE: This fax transmission is intended only for the addressee. It contains information that is legally privileged, confidential or otherwise protected from use or disclosure. If you are not the intended recipient, you are strictly prohibited from reviewing, disclosing, copying using or disseminating any of this information or taking any action in reliance on or regarding this information. If you have received this fax in error, please notify us immediately by telephone so that we can arrange for its return to [place]. Phone: [phone], Toll-Free: [phone], Fax: [phone] Page: 2 of 2 Call Id: [id number] Disp. Time Lamount Cohen Time) Disposition Final User 04/12/2021 12:20:03 PM SEE PCP WITHIN 3 DAYS Yes Kizzie Bane, RN, Rosalyn Charters Disagree/Comply Comply Caller Understands Yes PreDisposition Did not know what to do Care Advice Given Per Guideline SEE PCP WITHIN 3 DAYS: * You need to be seen within 2 or 3 days. CARE ADVICE given per Cellulitis on Antibiotic FollowUp Call (Adult) guideline. CALL BACK IF: * Fever over 100.4 F (38.0 C) * You become worse Referrals REFERRED TO PCP OFFICE

## 2021-04-15 ENCOUNTER — Other Ambulatory Visit: Payer: Self-pay

## 2021-04-18 ENCOUNTER — Other Ambulatory Visit: Payer: Self-pay

## 2021-04-18 ENCOUNTER — Encounter: Payer: Self-pay | Admitting: Family Medicine

## 2021-04-18 ENCOUNTER — Ambulatory Visit (INDEPENDENT_AMBULATORY_CARE_PROVIDER_SITE_OTHER): Payer: 59 | Admitting: Family Medicine

## 2021-04-18 VITALS — BP 123/78 | HR 77 | Temp 97.9°F | Resp 16 | Ht 67.0 in | Wt 204.4 lb

## 2021-04-18 DIAGNOSIS — Z889 Allergy status to unspecified drugs, medicaments and biological substances status: Secondary | ICD-10-CM

## 2021-04-18 DIAGNOSIS — E1165 Type 2 diabetes mellitus with hyperglycemia: Secondary | ICD-10-CM

## 2021-04-18 DIAGNOSIS — E119 Type 2 diabetes mellitus without complications: Secondary | ICD-10-CM | POA: Diagnosis not present

## 2021-04-18 DIAGNOSIS — I10 Essential (primary) hypertension: Secondary | ICD-10-CM

## 2021-04-18 DIAGNOSIS — E7800 Pure hypercholesterolemia, unspecified: Secondary | ICD-10-CM | POA: Diagnosis not present

## 2021-04-18 DIAGNOSIS — R21 Rash and other nonspecific skin eruption: Secondary | ICD-10-CM | POA: Diagnosis not present

## 2021-04-18 LAB — COMPREHENSIVE METABOLIC PANEL
ALT: 20 U/L (ref 0–53)
AST: 13 U/L (ref 0–37)
Albumin: 4.2 g/dL (ref 3.5–5.2)
Alkaline Phosphatase: 47 U/L (ref 39–117)
BUN: 17 mg/dL (ref 6–23)
CO2: 29 mEq/L (ref 19–32)
Calcium: 9.8 mg/dL (ref 8.4–10.5)
Chloride: 94 mEq/L — ABNORMAL LOW (ref 96–112)
Creatinine, Ser: 1.05 mg/dL (ref 0.40–1.50)
GFR: 76.3 mL/min (ref >60.00)
Glucose, Bld: 343 mg/dL — ABNORMAL HIGH (ref 70–99)
Potassium: 3.5 mEq/L (ref 3.5–5.1)
Sodium: 134 mEq/L — ABNORMAL LOW (ref 135–145)
Total Bilirubin: 1.8 mg/dL — ABNORMAL HIGH (ref 0.2–1.2)
Total Protein: 6.9 g/dL (ref 6.0–8.3)

## 2021-04-18 LAB — LIPID PANEL
Cholesterol: 213 mg/dL — ABNORMAL HIGH (ref 0–200)
HDL: 41.2 mg/dL (ref >39.00)
NonHDL: 172.23
Total CHOL/HDL Ratio: 5
Triglycerides: 288 mg/dL — ABNORMAL HIGH (ref 0.0–149.0)
VLDL: 57.6 mg/dL — ABNORMAL HIGH (ref 0.0–40.0)

## 2021-04-18 LAB — HEMOGLOBIN A1C: Hgb A1c MFr Bld: 12.2 % — ABNORMAL HIGH (ref 4.6–6.5)

## 2021-04-18 LAB — LDL CHOLESTEROL, DIRECT: Direct LDL: 129 mg/dL

## 2021-04-18 MED ORDER — FLUTICASONE PROPIONATE 0.05 % EX CREA: TOPICAL_CREAM | CUTANEOUS | 1 refills | Status: DC

## 2021-04-18 NOTE — Progress Notes (Signed)
OFFICE VISIT  04/18/2021  CC:  Chief Complaint  Patient presents with  . Follow-up    DM, HTN. Went to UC on 04/13/21 for rash and ED yesterday for elevated glucose. He is fasting   HPI:    Patient is a 60 y.o. Caucasian male who presents for f/u DM, HTN, and HLD. A/P as of last [facility] f/u visit 15 months ago: "1) Uncontrolled HTN: bp not too bad, though, and he's feeling fine. Increase amlodipine to 10mg  qd, cont working on TLC. Repeat lytes/cr in 3 mo.  2) Hyperchol: continue zetia and atorva, plan recheck lipids and hepatic panel in 3 mo.  3) DM 2: stable. No changes. Next a1c in 3 mo"  INTERIM HX: I saw him 03/11/21 for acute R LB/R hip pain felt to be of muscular etiology-->referred to PT. Also had nonspecific dermatitis on both lower legs and I rx'd prednisone taper and keflex at that time. On 03/29/21 he presented to the ED for worsening skin eruption (pruritic) that was now on back, chest, and arms.  Was given steroids and placed on doxycycline. He saw dermatologist 2 days later and was dx'd with allergic rxn to keflex. 04/13/21 f/u with derm for ongoing rash after finishing doxy-->pt was put back on doxy and prednisone. 04/17/21 ED at [facility] for severe hyperglycemia, blurry vision, fatigue->+glucose in urine, glucose initially 497, then down to 249, , Na 128, remainder of cmet normal except T bili 1.7.  His sCr was 1.22. WBC 11.3, Hb 14.8, Plt 202K. He was given 10 units of regular insulin and some IVF.  Rash: his last dose of systemic steroid was 2 d/a.   Prior to this he was on steroids for weeks d/t this rash. Rash is gradually improving.  LE rash somewhat itchy. He is finishing up doxy soon.  DM: prior to recent systemic steroids he wasn't keeping up with his glucoses much at all, but recalls <130. Feet: no burning, tingling, or numbness in feet. Taking metformin 500 mg bid.  HTN: no monitoring at work or home.    HLD: atorva 80mg  qd, zetia 10mg   Has only  been taking the zetia z 8d b/c of delay of MONTHs before getting his rx (?).  ROS as above, plus--> no fevers, no CP, no SOB, no wheezing, no cough, no dizziness, no HAs, no melena/hematochezia.  No further polyruria or polydipsia since yesterday's ED visit.  No myalgias or arthralgias.  No focal weakness, paresthesias, or tremors.  No acute vision or hearing abnormalities.  No dysuria or unusual/new urinary urgency or frequency.  No recent changes in lower legs. No n/v/d or abd pain.  No palpitations.      Past Medical History:  Diagnosis Date  . Coronary artery disease    DES to RCA 2010 in setting of acute event; repeat cath 03/02/10 for chest pain showed widely patent stent and normal LV function.  . DDD (degenerative disc disease), lumbar   . Diabetes mellitus without complication (HCC) 2019  . Diverticular disease    h/o 'itis  . Fatty liver   . Genital warts 06/23/2013  . History of pancreatitis   . History of peptic ulcer disease   . Hyperlipidemia    Atorva 80. Recommended adding zetia 11/2019  . Hypertension   . Mood disorder (HCC)    per old records; anger and irritability + elevated libido--improved on sertraline.  . Renal artery stenosis (HCC) 2011   Discordant data (mild bilat ostial disease on  distal aortogram at time of cath, renal arteries appeared normal.  Then renal doppler u/s showed R>L RAS, renal sizes similar.  Recheck of renal artery dopplers 06/2013 showed no significant changes--repeat in 12 months recommended.  . Right flank pain    apparently had a trigger point b/c once the orthopedist injected the region he had no further pain.  (extensive w/u done for this problem prior to the injection, though).    Past Surgical History:  Procedure Laterality Date  . COLONOSCOPY  2007   Normal per pt.  Recall 2017 (done in [place], [place]).  . CORONARY STENT PLACEMENT  09/14/09   RCA (Dr. Clarene Duke)  . KNEE SURGERY  2007   Rt knee arthroscopy  . KNEE SURGERY  1983   Bilat  knee debridement  . PALATE SURGERY  age 64 mo  . TYMPANOSTOMY TUBE PLACEMENT  1967    Outpatient Medications Prior to Visit  Medication Sig Dispense Refill  . amLODipine (NORVASC) 10 MG tablet Take 1 tablet (10 mg total) by mouth daily. 30 tablet 0  . aspirin 325 MG tablet Take 325 mg by mouth daily.    Marland Kitchen atorvastatin (LIPITOR) 80 MG tablet Take 1 tablet (80 mg total) by mouth daily. 90 tablet 3  . cetirizine (ZYRTEC) 10 MG tablet Take 1 tablet (10 mg total) by mouth daily. 30 tablet 11  . doxycycline (VIBRA-TABS) 100 MG tablet Take 100 mg by mouth 2 (two) times daily.    Marland Kitchen ezetimibe (ZETIA) 10 MG tablet TAKE 1 TABLET BY MOUTH EVERY DAY 30 tablet 0  . fluticasone (FLONASE) 50 MCG/ACT nasal spray Place 2 sprays into both nostrils daily. 16 g 6  . hydrochlorothiazide (HYDRODIURIL) 25 MG tablet Take 1 tablet (25 mg total) by mouth daily. 30 tablet 0  . losartan (COZAAR) 100 MG tablet TAKE 1 TABLET BY MOUTH EVERY DAY 30 tablet 0  . metFORMIN (GLUCOPHAGE) 500 MG tablet TAKE 1 TABLET (500 MG TOTAL) BY MOUTH 2 (TWO) TIMES DAILY WITH A MEAL. 60 tablet 0  . metoprolol succinate (TOPROL-XL) 100 MG 24 hr tablet TAKE ONE TAB BY MOUTH WITH OR IMMEDIATELY FLLOWING MEAL 30 tablet 0  . hydrocortisone (ANUSOL-HC) 2.5 % rectal cream Place 1 application rectally 2 (two) times daily. (Patient not taking: Reported on 04/18/2021) 30 g 2  . nitroGLYCERIN (NITROSTAT) 0.4 MG SL tablet Place 1 tablet (0.4 mg total) under the tongue every 5 (five) minutes as needed. (Patient not taking: No sig reported) 30 tablet 11  . fluconazole (DIFLUCAN) 150 MG tablet Take by mouth. (Patient not taking: Reported on 04/18/2021)    . metaxalone (SKELAXIN) 800 MG tablet 1 tab po tid prn for muscle relaxation (Patient not taking: Reported on 04/18/2021) 30 tablet 1  . mometasone (ELOCON) 0.1 % ointment Apply topically. (Patient not taking: Reported on 04/18/2021)    . predniSONE (STERAPRED UNI-PAK 21 TAB) 10 MG (21) TBPK tablet Take by mouth  daily. Take 6 tabs by mouth daily  for 2 days, then 5 tabs for 2 days, then 4 tabs for 2 days, then 3 tabs for 2 days, 2 tabs for 2 days, then 1 tab by mouth daily for 2 days (Patient not taking: Reported on 04/18/2021) 42 tablet 0   No facility-administered medications prior to visit.    Allergies  Allergen Reactions  . Orphenadrine Rash  . Tramadol     Per pt: legs kick, trouble coming off  . Keflex [Cephalexin] Rash  . Penicillins Rash and Other (See  Comments)    Angio-edema    ROS As per HPI  PE: Vitals with BMI 04/18/2021 03/29/2021 03/29/2021  Height 5\' 7"  - -  Weight 204 lbs 6 oz - -  BMI 32.01 - -  Systolic 123 128  Diastolic 78 69 97  Pulse 77 60 72   Gen: Alert, well appearing.  Patient is oriented to person, place, time, and situation. AFFECT: pleasant, lucid thought and speech. CV: RRR, no m/r/g.   LUNGS: CTA bilat, nonlabored resps, good aeration in all lung fields. EXT: no clubbing or cyanosis.  no edema.  SKIN: scattered hyperpigmented macules on chest, upper back/shoulders, arms, hands: some coalesced and a bit of superficial scaling on some.  No erythema, vesicles, or pustules.  Both LL's with pinkish macular rash over anterior tibial surface, R>L.  Mildly rough to touch, no maceration or vesicles or pustules. No streaking.  No tenderness or excessive warmth to palpation. No pitting edema.  LABS:  Lab Results  Component Value Date   TSH 2.05 11/25/2019   Lab Results  Component Value Date   WBC 6.0 11/25/2019   HGB 14.7 11/25/2019   HCT 44.0 11/25/2019   MCV 88.2 11/25/2019   PLT 182.0 11/25/2019   Lab Results  Component Value Date   CREATININE 1.26 11/25/2019   BUN 14 11/25/2019   NA 142 11/25/2019   K 4.5 11/25/2019   CL 103 11/25/2019   CO2 30 11/25/2019   Lab Results  Component Value Date   ALT 19 11/25/2019   AST 16 11/25/2019   ALKPHOS 33 (L) 11/25/2019   BILITOT 0.9 11/25/2019   Lab Results  Component Value Date   CHOL 202 (H)  11/25/2019   Lab Results  Component Value Date   HDL 41.00 11/25/2019   Lab Results  Component Value Date   LDLCALC 134 (H) 11/25/2019   Lab Results  Component Value Date   TRIG 136.0 11/25/2019   Lab Results  Component Value Date   CHOLHDL 5 11/25/2019   Lab Results  Component Value Date   PSA 0.4 05/09/2019   PSA 0.30 05/28/2018   PSA 0.36 07/09/2014   Lab Results  Component Value Date   HGBA1C 6.8 (H) 11/25/2019   IMPRESSION AND PLAN:  1) Bilat LL dermatitis, unknown etiology-->improving with lots of systemic steroids of late. Bacterial superinfection at one point but I think this has resolved at this time. He can finish the doxy he is on. Cutivate 0.05% cream bid to affected areas of LL's.  2) Drug eruption: from keflex. Resolving approp with recent systemic steroids.  3) DM, severe hyperglycemia with all the systemic steroids lately. Otherwise control unclear b/c prior to that was not monitoring glucose with regularity, although he was asymptomatic.   Feet exam normal today. Hba1c, lytes/cr, and urine microalb/cr today.  4) HLD: tolerating high dose atorva but only just recently started the zetia we tried to add long ago. FLP and hepatic panel today.  5) HTN: well controlled. Cont losartan 100mg  qd, amlodipine 10 qd, toprol xl 100mg  qd, and hctz 25mg  qd. Lytes/cr today.  An After Visit Summary was printed and given to the patient.  FOLLOW UP: Return in about 3 months (around 07/19/2021) for annual CPE (fasting).  Signed:  , MD           04/18/2021

## 2021-04-19 LAB — MICROALBUMIN / CREATININE URINE RATIO
Creatinine,U: 99.8 mg/dL
Microalb Creat Ratio: 3.1 mg/g (ref 0.0–30.0)
Microalb, Ur: 3.1 mg/dL — ABNORMAL HIGH (ref 0.0–1.9)

## 2021-04-20 ENCOUNTER — Other Ambulatory Visit: Payer: Self-pay | Admitting: Family Medicine

## 2021-04-20 ENCOUNTER — Other Ambulatory Visit: Payer: Self-pay

## 2021-04-20 MED ORDER — METFORMIN HCL 1000 MG PO TABS: 1000.0000 mg | ORAL_TABLET | Freq: Two times a day (BID) | ORAL | 3 refills | Status: DC

## 2021-04-20 NOTE — Progress Notes (Signed)
A user error has taken place: encounter opened in error, closed for administrative reasons.

## 2021-04-27 ENCOUNTER — Other Ambulatory Visit: Payer: Self-pay | Admitting: Family Medicine

## 2021-05-04 DIAGNOSIS — U071 COVID-19: Secondary | ICD-10-CM

## 2021-05-04 HISTORY — DX: COVID-19: U07.1

## 2021-05-05 ENCOUNTER — Other Ambulatory Visit: Payer: Self-pay | Admitting: Family Medicine

## 2021-05-16 ENCOUNTER — Ambulatory Visit: Payer: 59 | Admitting: Family Medicine

## 2021-05-16 DIAGNOSIS — Z0289 Encounter for other administrative examinations: Secondary | ICD-10-CM

## 2021-05-16 NOTE — Progress Notes (Signed)
OFFICE VISIT  05/17/2021  CC:  Chief Complaint  Patient presents with  . Follow-up    DM 2; review sugars.     HPI:    Patient is a 60 y.o. Caucasian male who presents for 1 mo f/u DM 2, poorly controlled. A/P as of last visit: "1) Bilat LL dermatitis, unknown etiology-->improving with lots of systemic steroids of late. Bacterial superinfection at one point but I think this has resolved at this time. He can finish the doxy he is on. Cutivate 0.05% cream bid to affected areas of LL's.  2) Drug eruption: from keflex. Resolving approp with recent systemic steroids.  3) DM, severe hyperglycemia with all the systemic steroids lately. Otherwise control unclear b/c prior to that was not monitoring glucose with regularity, although he was asymptomatic.   Feet exam normal today. Hba1c, lytes/cr, and urine microalb/cr today.  4) HLD: tolerating high dose atorva but only just recently started the zetia we tried to add long ago. FLP and hepatic panel today.  5) HTN: well controlled. Cont losartan 100mg  qd, amlodipine 10 qd, toprol xl 100mg  qd, and hctz 25mg  qd. Lytes/cr today."  INTERIM HX: Feeling well, had covid since I last saw him, typical febrile URI with cough--says all better now. 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  DM:  Last visit his a1c was 12.2% and I recommended he inc metformin to 1000 mg bid and start lantus.  He increased metformin but declined to start lantus, wanted to monitor home glucoses and come to review them today. Says highest gluc 137 last couple weeks, avg 128, lowest 90 (typically fasting check , occ 2H PP)  Has made signif dietary adjustments.    Fluticasone helping LE rash : gradually resolving.  Past Medical History:  Diagnosis Date  . Coronary artery disease    DES to RCA 2010 in setting of acute event; repeat cath 03/02/10 for chest pain showed widely patent stent and normal LV  function.  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 DDD (degenerative disc disease), lumbar   . Diabetes mellitus without complication (HCC) 2019  . Diverticular disease    h/o 'itis  . Fatty liver   . Genital warts 06/23/2013  . History of pancreatitis   . History of peptic ulcer disease   . Hyperlipidemia    Atorva 80. Recommended adding zetia 11/2019  . Hypertension   . Mood disorder (HCC)    per old records; anger and irritability + elevated libido--improved on sertraline.  . Renal artery stenosis (HCC) 2011   Discordant data (mild bilat ostial disease on distal aortogram at time of cath, renal arteries appeared normal.  Then renal doppler u/s showed R>L RAS, renal sizes similar.  Recheck of renal artery dopplers 06/2013 showed no significant changes--repeat in 12 months recommended.  . Right flank pain    apparently had a trigger point b/c once the orthopedist injected the region he had no further pain.  (extensive w/u done for this problem prior to the injection, though).    Past Surgical History:  Procedure Laterality Date  . COLONOSCOPY  2007   Normal per pt.  Recall 2017 (done in [place], 2008).  . CORONARY STENT PLACEMENT  09/14/09   RCA (Dr. NORSBORG)  . KNEE SURGERY  2007   Rt knee arthroscopy  . KNEE SURGERY  1983   Bilat knee debridement  . PALATE SURGERY  age 69 mo  . TYMPANOSTOMY TUBE PLACEMENT  1967    Outpatient Medications Prior to Visit  Medication Sig Dispense Refill  . amLODipine (NORVASC) 10 MG tablet TAKE 1 TABLET BY MOUTH EVERY DAY 90 tablet 0  . aspirin 325 MG tablet Take 325 mg by mouth daily.    Marland Kitchen atorvastatin (LIPITOR) 80 MG tablet Take 1 tablet (80 mg total) by mouth daily. 90 tablet 3  . cetirizine (ZYRTEC) 10 MG tablet Take 1 tablet (10 mg total) by mouth daily. 30 tablet 11  . ezetimibe (ZETIA) 10 MG tablet TAKE 1 TABLET BY MOUTH EVERY DAY 30 tablet 0  . fluticasone (FLONASE) 50 MCG/ACT nasal spray Place 2 sprays into both nostrils daily. 16 g 6   . hydrochlorothiazide (HYDRODIURIL) 25 MG tablet TAKE 1 TABLET (25 MG TOTAL) BY MOUTH DAILY. 90 tablet 0  . losartan (COZAAR) 100 MG tablet TAKE 1 TABLET BY MOUTH EVERY DAY 30 tablet 0  . metFORMIN (GLUCOPHAGE) 1000 MG tablet Take 1 tablet (1,000 mg total) by mouth 2 (two) times daily with a meal. 60 tablet 3  . metFORMIN (GLUCOPHAGE) 500 MG tablet TAKE 1 TABLET (500 MG TOTAL) BY MOUTH 2 (TWO) TIMES DAILY WITH A MEAL. 60 tablet 0  . metoprolol succinate (TOPROL-XL) 100 MG 24 hr tablet TAKE ONE TAB BY MOUTH WITH OR IMMEDIATELY FLLOWING MEAL 90 tablet 0  . fluticasone (CUTIVATE) 0.05 % cream Apply to rash on lower legs bid prn 30 g 1  . hydrocortisone (ANUSOL-HC) 2.5 % rectal cream Place 1 application rectally 2 (two) times daily. (Patient not taking: No sig reported) 30 g 2  . nitroGLYCERIN (NITROSTAT) 0.4 MG SL tablet Place 1 tablet (0.4 mg total) under the tongue every 5 (five) minutes as needed. (Patient not taking: No sig reported) 30 tablet 11  . doxycycline (VIBRA-TABS) 100 MG tablet Take 100 mg by mouth 2 (two) times daily. (Patient not taking: Reported on 05/17/2021)     No facility-administered medications prior to visit.    Allergies  Allergen Reactions  . Orphenadrine Rash  . Tramadol     Per pt: legs kick, trouble coming off  . Keflex [Cephalexin] Rash  . Penicillins Rash and Other (See Comments)    Angio-edema    ROS As per HPI  PE: Vitals with BMI 05/17/2021 04/18/2021 03/29/2021  Height 5\' 7"  5\' 7"  -  Weight 205 lbs 204 lbs 6 oz -  BMI 32.1 32.01 -  Systolic 125 123  Diastolic 80 78 69  Pulse 60 77 60     Gen: Alert, well appearing.  Patient is oriented to person, place, time, and situation. AFFECT: pleasant, lucid thought and speech. CV: RRR, no m/r/g.   LUNGS: CTA bilat, nonlabored resps, good aeration in all lung fields. EXT: no clubbing or cyanosis.  no edema.  LL's pretibial regions with diffuse pinkish splotches c/w dermatitis, R>L  LABS:  Lab Results   Component Value Date   TSH 2.05 11/25/2019   Lab Results  Component Value Date   WBC 6.0 11/25/2019   HGB 14.7 11/25/2019   HCT 44.0 11/25/2019   MCV 88.2 11/25/2019   PLT 182.0 11/25/2019   Lab Results  Component Value Date   CREATININE 1.05 04/18/2021   BUN 17 04/18/2021   NA 134 (L) 04/18/2021   K 3.5 04/18/2021   CL 94 (L) 04/18/2021   CO2 29 04/18/2021   Lab Results  Component Value Date   ALT 20 04/18/2021   AST 13 04/18/2021   ALKPHOS 47 04/18/2021  BILITOT 1.8 (H) 04/18/2021   Lab Results  Component Value Date   CHOL 213 (H) 04/18/2021   Lab Results  Component Value Date   HDL 41.20 04/18/2021   Lab Results  Component Value Date   LDLCALC 134 (H) 11/25/2019   Lab Results  Component Value Date   TRIG 288.0 (H) 04/18/2021   Lab Results  Component Value Date   CHOLHDL 5 04/18/2021   Lab Results  Component Value Date   PSA 0.4 05/09/2019   PSA 0.30 05/28/2018   PSA 0.36 07/09/2014   Lab Results  Component Value Date   HGBA1C 12.2 (H) 04/18/2021   IMPRESSION AND PLAN:  1) DM 2: poor control (a1c 1 mo ago 12.2%). Signif dietary changes plus increase in metformin to 1000 mg bid has glucoses much improved. We'll hold off on any additional meds today. No labs needed today.  2) LL's pretibial dermatitis--slowly resolving with cutivate cream->RF'd today.  3) Covid 19 resp infection: resolved.  He has been fully vaccinated.  An After Visit Summary was printed and given to the patient.  FOLLOW UP: Return in about 3 months (around 08/17/2021) for annual CPE (fasting). Cpe/rci 2-3 mo  Signed:  Santiago Bumpers, MD           05/17/2021

## 2021-05-16 NOTE — Progress Notes (Deleted)
OFFICE VISIT  05/16/2021  CC: No chief complaint on file.   HPI:    Patient is a 60 y.o. Caucasian male who presents for 1 mo f/u poorly controlled DM. A/P as of last visit: "1) Bilat LL dermatitis, unknown etiology-->improving with lots of systemic steroids of late. Bacterial superinfection at one point but I think this has resolved at this time. He can finish the doxy he is on. Cutivate 0.05% cream bid to affected areas of LL's.  2) Drug eruption: from keflex. Resolving approp with recent systemic steroids.  3) DM, severe hyperglycemia with all the systemic steroids lately. Otherwise control unclear b/c prior to that was not monitoring glucose with regularity, although he was asymptomatic.   Feet exam normal today. Hba1c, lytes/cr, and urine microalb/cr today.  4) HLD: tolerating high dose atorva but only just recently started the zetia we tried to add long ago. FLP and hepatic panel today.  5) HTN: well controlled. Cont losartan 100mg  qd, amlodipine 10 qd, toprol xl 100mg  qd, and hctz 25mg  qd. Lytes/cr today."  INTERIM HX: Hba1c was 12.2% last visit and I recommended he increase metformin to 1000 bid and start daily lantus. He declined to start lantus but increased metformin as recommended. ***     Past Medical History:  Diagnosis Date  . Coronary artery disease    DES to RCA 2010 in setting of acute event; repeat cath 03/02/10 for chest pain showed widely patent stent and normal LV function.  . DDD (degenerative disc disease), lumbar   . Diabetes mellitus without complication (HCC) 2019  . Diverticular disease    h/o 'itis  . Fatty liver   . Genital warts 06/23/2013  . History of pancreatitis   . History of peptic ulcer disease   . Hyperlipidemia    Atorva 80. Recommended adding zetia 11/2019  . Hypertension   . Mood disorder (HCC)    per old records; anger and irritability + elevated libido--improved on sertraline.  . Renal artery stenosis (HCC) 2011    Discordant data (mild bilat ostial disease on distal aortogram at time of cath, renal arteries appeared normal.  Then renal doppler u/s showed R>L RAS, renal sizes similar.  Recheck of renal artery dopplers 06/2013 showed no significant changes--repeat in 12 months recommended.  . Right flank pain    apparently had a trigger point b/c once the orthopedist injected the region he had no further pain.  (extensive w/u done for this problem prior to the injection, though).    Past Surgical History:  Procedure Laterality Date  . COLONOSCOPY  2007   Normal per pt.  Recall 2017 (done in [place], 2008).  . CORONARY STENT PLACEMENT  09/14/09   RCA (Dr. NORSBORG)  . KNEE SURGERY  2007   Rt knee arthroscopy  . KNEE SURGERY  1983   Bilat knee debridement  . PALATE SURGERY  age 19 mo  . TYMPANOSTOMY TUBE PLACEMENT  1967    Outpatient Medications Prior to Visit  Medication Sig Dispense Refill  . amLODipine (NORVASC) 10 MG tablet TAKE 1 TABLET BY MOUTH EVERY DAY 90 tablet 0  . aspirin 325 MG tablet Take 325 mg by mouth daily.    Clarene Duke atorvastatin (LIPITOR) 80 MG tablet Take 1 tablet (80 mg total) by mouth daily. 90 tablet 3  . cetirizine (ZYRTEC) 10 MG tablet Take 1 tablet (10 mg total) by mouth daily. 30 tablet 11  . doxycycline (VIBRA-TABS) 100 MG tablet Take 100 mg by mouth 2 (two)  times daily.    Marland Kitchen ezetimibe (ZETIA) 10 MG tablet TAKE 1 TABLET BY MOUTH EVERY DAY 30 tablet 0  . fluticasone (CUTIVATE) 0.05 % cream Apply to rash on lower legs bid prn 30 g 1  . fluticasone (FLONASE) 50 MCG/ACT nasal spray Place 2 sprays into both nostrils daily. 16 g 6  . hydrochlorothiazide (HYDRODIURIL) 25 MG tablet TAKE 1 TABLET (25 MG TOTAL) BY MOUTH DAILY. 90 tablet 0  . hydrocortisone (ANUSOL-HC) 2.5 % rectal cream Place 1 application rectally 2 (two) times daily. (Patient not taking: Reported on 04/18/2021) 30 g 2  . losartan (COZAAR) 100 MG tablet TAKE 1 TABLET BY MOUTH EVERY DAY 30 tablet 0  . metFORMIN (GLUCOPHAGE)  1000 MG tablet Take 1 tablet (1,000 mg total) by mouth 2 (two) times daily with a meal. 60 tablet 3  . metFORMIN (GLUCOPHAGE) 500 MG tablet TAKE 1 TABLET (500 MG TOTAL) BY MOUTH 2 (TWO) TIMES DAILY WITH A MEAL. 60 tablet 0  . metoprolol succinate (TOPROL-XL) 100 MG 24 hr tablet TAKE ONE TAB BY MOUTH WITH OR IMMEDIATELY FLLOWING MEAL 90 tablet 0  . nitroGLYCERIN (NITROSTAT) 0.4 MG SL tablet Place 1 tablet (0.4 mg total) under the tongue every 5 (five) minutes as needed. (Patient not taking: No sig reported) 30 tablet 11   No facility-administered medications prior to visit.    Allergies  Allergen Reactions  . Orphenadrine Rash  . Tramadol     Per pt: legs kick, trouble coming off  . Keflex [Cephalexin] Rash  . Penicillins Rash and Other (See Comments)    Angio-edema    ROS As per HPI  PE: Vitals with BMI 04/18/2021 03/29/2021 03/29/2021  Height 5\' 7"  - -  Weight 204 lbs 6 oz - -  BMI 32.01 - -  Systolic 123 128  Diastolic 78 69 97  Pulse 77 60 72     ***  LABS:  Lab Results  Component Value Date   TSH 2.05 11/25/2019   Lab Results  Component Value Date   WBC 6.0 11/25/2019   HGB 14.7 11/25/2019   HCT 44.0 11/25/2019   MCV 88.2 11/25/2019   PLT 182.0 11/25/2019   Lab Results  Component Value Date   CREATININE 1.05 04/18/2021   BUN 17 04/18/2021   NA 134 (L) 04/18/2021   K 3.5 04/18/2021   CL 94 (L) 04/18/2021   CO2 29 04/18/2021   Lab Results  Component Value Date   ALT 20 04/18/2021   AST 13 04/18/2021   ALKPHOS 47 04/18/2021   BILITOT 1.8 (H) 04/18/2021   Lab Results  Component Value Date   CHOL 213 (H) 04/18/2021   Lab Results  Component Value Date   HDL 41.20 04/18/2021   Lab Results  Component Value Date   LDLCALC 134 (H) 11/25/2019   Lab Results  Component Value Date   TRIG 288.0 (H) 04/18/2021   Lab Results  Component Value Date   CHOLHDL 5 04/18/2021   Lab Results  Component Value Date   PSA 0.4 05/09/2019   PSA 0.30  05/28/2018   PSA 0.36 07/09/2014   Lab Results  Component Value Date   HGBA1C 12.2 (H) 04/18/2021   IMPRESSION AND PLAN:  No problem-specific Assessment & Plan notes found for this encounter.  No labs needed (?PSA?)  An After Visit Summary was printed and given to the patient.  FOLLOW UP: No follow-ups on file. "due" for cpe  Signed:  06/18/2021, MD  05/16/2021    

## 2021-05-17 ENCOUNTER — Ambulatory Visit (INDEPENDENT_AMBULATORY_CARE_PROVIDER_SITE_OTHER): Payer: 59 | Admitting: Family Medicine

## 2021-05-17 ENCOUNTER — Other Ambulatory Visit: Payer: Self-pay

## 2021-05-17 ENCOUNTER — Encounter: Payer: Self-pay | Admitting: Family Medicine

## 2021-05-17 VITALS — BP 125/80 | HR 60 | Temp 98.0°F | Resp 16 | Ht 67.0 in | Wt 205.0 lb

## 2021-05-17 DIAGNOSIS — E119 Type 2 diabetes mellitus without complications: Secondary | ICD-10-CM

## 2021-05-17 MED ORDER — FLUTICASONE PROPIONATE 0.05 % EX CREA
TOPICAL_CREAM | CUTANEOUS | 1 refills | Status: DC
Start: 2021-05-17 — End: 2021-07-11

## 2021-06-13 ENCOUNTER — Other Ambulatory Visit: Payer: Self-pay | Admitting: Family Medicine

## 2021-06-22 ENCOUNTER — Encounter: Payer: Self-pay | Admitting: Family Medicine

## 2021-07-07 ENCOUNTER — Other Ambulatory Visit: Payer: Self-pay | Admitting: Family Medicine

## 2021-07-08 ENCOUNTER — Other Ambulatory Visit: Payer: Self-pay | Admitting: Family Medicine

## 2021-07-25 ENCOUNTER — Other Ambulatory Visit: Payer: Self-pay

## 2021-07-25 ENCOUNTER — Encounter: Payer: Self-pay | Admitting: Family Medicine

## 2021-07-25 ENCOUNTER — Ambulatory Visit (INDEPENDENT_AMBULATORY_CARE_PROVIDER_SITE_OTHER): Payer: 59 | Admitting: Family Medicine

## 2021-07-25 VITALS — BP 116/77 | HR 63 | Temp 98.4°F | Resp 16 | Ht 67.0 in | Wt 201.0 lb

## 2021-07-25 DIAGNOSIS — E119 Type 2 diabetes mellitus without complications: Secondary | ICD-10-CM

## 2021-07-25 DIAGNOSIS — E7800 Pure hypercholesterolemia, unspecified: Secondary | ICD-10-CM | POA: Diagnosis not present

## 2021-07-25 DIAGNOSIS — Z23 Encounter for immunization: Secondary | ICD-10-CM

## 2021-07-25 DIAGNOSIS — I10 Essential (primary) hypertension: Secondary | ICD-10-CM

## 2021-07-25 DIAGNOSIS — Z0000 Encounter for general adult medical examination without abnormal findings: Secondary | ICD-10-CM

## 2021-07-25 DIAGNOSIS — Z1211 Encounter for screening for malignant neoplasm of colon: Secondary | ICD-10-CM

## 2021-07-25 DIAGNOSIS — Z125 Encounter for screening for malignant neoplasm of prostate: Secondary | ICD-10-CM

## 2021-07-25 LAB — CBC WITH DIFFERENTIAL/PLATELET
Basophils Absolute: 0.2 10*3/uL — ABNORMAL HIGH (ref 0.0–0.1)
Basophils Relative: 2.9 % (ref 0.0–3.0)
Eosinophils Absolute: 0.8 10*3/uL — ABNORMAL HIGH (ref 0.0–0.7)
Eosinophils Relative: 11.8 % — ABNORMAL HIGH (ref 0.0–5.0)
HCT: 42 % (ref 39.0–52.0)
Hemoglobin: 13.9 g/dL (ref 13.0–17.0)
Lymphocytes Relative: 19.4 % (ref 12.0–46.0)
Lymphs Abs: 1.3 10*3/uL (ref 0.7–4.0)
MCHC: 33.1 g/dL (ref 30.0–36.0)
MCV: 86.4 fl (ref 78.0–100.0)
Monocytes Absolute: 0.6 10*3/uL (ref 0.1–1.0)
Monocytes Relative: 8.9 % (ref 3.0–12.0)
Neutro Abs: 3.7 10*3/uL (ref 1.4–7.7)
Neutrophils Relative %: 57 % (ref 43.0–77.0)
Platelets: 190 10*3/uL (ref 150.0–400.0)
RBC: 4.86 Mil/uL (ref 4.22–5.81)
RDW: 13.4 % (ref 11.5–15.5)
WBC: 6.5 10*3/uL (ref 4.0–10.5)

## 2021-07-25 LAB — LIPID PANEL
Cholesterol: 148 mg/dL (ref 0–200)
HDL: 42.9 mg/dL (ref >39.00)
LDL Cholesterol: 89 mg/dL (ref 0–99)
NonHDL: 105.57
Total CHOL/HDL Ratio: 3
Triglycerides: 82 mg/dL (ref 0.0–149.0)
VLDL: 16.4 mg/dL (ref 0.0–40.0)

## 2021-07-25 LAB — COMPREHENSIVE METABOLIC PANEL
ALT: 10 U/L (ref 0–53)
AST: 12 U/L (ref 0–37)
Albumin: 4.5 g/dL (ref 3.5–5.2)
Alkaline Phosphatase: 31 U/L — ABNORMAL LOW (ref 39–117)
BUN: 20 mg/dL (ref 6–23)
CO2: 29 mEq/L (ref 19–32)
Calcium: 10.3 mg/dL (ref 8.4–10.5)
Chloride: 104 mEq/L (ref 96–112)
Creatinine, Ser: 1.09 mg/dL (ref 0.40–1.50)
GFR: 72.82 mL/min (ref >60.00)
Glucose, Bld: 104 mg/dL — ABNORMAL HIGH (ref 70–99)
Potassium: 4.9 mEq/L (ref 3.5–5.1)
Sodium: 142 mEq/L (ref 135–145)
Total Bilirubin: 0.8 mg/dL (ref 0.2–1.2)
Total Protein: 7.2 g/dL (ref 6.0–8.3)

## 2021-07-25 LAB — HEMOGLOBIN A1C: Hgb A1c MFr Bld: 6.7 % — ABNORMAL HIGH (ref 4.6–6.5)

## 2021-07-25 LAB — TSH: TSH: 1.21 u[IU]/mL (ref 0.35–5.50)

## 2021-07-25 LAB — PSA: PSA: 0.28 ng/mL (ref 0.10–4.00)

## 2021-07-25 MED ORDER — LOSARTAN POTASSIUM 100 MG PO TABS: 100.0000 mg | ORAL_TABLET | Freq: Every day | ORAL | 3 refills | Status: DC

## 2021-07-25 MED ORDER — ATORVASTATIN CALCIUM 80 MG PO TABS: 80.0000 mg | ORAL_TABLET | Freq: Every day | ORAL | 3 refills | Status: DC

## 2021-07-25 MED ORDER — HYDROCHLOROTHIAZIDE 25 MG PO TABS: 25.0000 mg | ORAL_TABLET | Freq: Every day | ORAL | 3 refills | Status: DC

## 2021-07-25 MED ORDER — METFORMIN HCL 1000 MG PO TABS: 1000.0000 mg | ORAL_TABLET | Freq: Two times a day (BID) | ORAL | 3 refills | Status: DC

## 2021-07-25 MED ORDER — EZETIMIBE 10 MG PO TABS: 10.0000 mg | ORAL_TABLET | Freq: Every day | ORAL | 3 refills | Status: DC

## 2021-07-25 MED ORDER — METOPROLOL SUCCINATE ER 100 MG PO TB24
ORAL_TABLET | ORAL | 3 refills | Status: DC
Start: 2021-07-25 — End: 2022-08-03

## 2021-07-25 MED ORDER — AMLODIPINE BESYLATE 10 MG PO TABS: 10.0000 mg | ORAL_TABLET | Freq: Every day | ORAL | 3 refills | Status: DC

## 2021-07-25 NOTE — Addendum Note (Signed)
Addended by: Emi Holes D on: 07/25/2021 09:21 AM   Modules accepted: Orders

## 2021-07-25 NOTE — Progress Notes (Signed)
Office Note 07/25/2021  CC:  Chief Complaint  Patient presents with   Annual Exam    Pt is fasting    HPI:  Eric Hunter is a 60 y.o. White male who is here for annual health maintenance exam and f/u DM, HTN, and HLD. A/P as of last visit on 05/17/21: "1) DM 2: poor control (a1c 1 mo ago 12.2%). Signif dietary changes plus increase in metformin to 1000 mg bid has glucoses much improved. We'll hold off on any additional meds today. No labs needed today.   2) LL's pretibial dermatitis--slowly resolving with cutivate cream->RF'd today.   3) Covid 19 resp infection: resolved.  He has been fully vaccinated."  INTERIM HX: Doing great.  Working hard at an urgent care. Says dietary and exercise habits are continuing to be good since I saw him last.  DM: glucoses 120s, compliant with metformin 1000mg  bid.  HTN: home bp's consistently <130/80.  HLD: tolerating zetia 10mg  qd and atorva 80 qd.   Past Medical History:  Diagnosis Date   Coronary artery disease    DES to RCA 2010 in setting of acute event; repeat cath 03/02/10 for chest pain showed widely patent stent and normal LV function.   COVID-19 virus infection 05/04/2021   DDD (degenerative disc disease), lumbar    Diabetes mellitus without complication (HCC) 2019   Diverticular disease    h/o 'itis   Fatty liver    Genital warts 06/23/2013   History of pancreatitis    History of peptic ulcer disease    Hyperlipidemia    Atorva 80. Recommended adding zetia 11/2019   Hypertension    Mood disorder (HCC)    per old records; anger and irritability + elevated libido--improved on sertraline.   Renal artery stenosis (HCC) 2011   Discordant data (mild bilat ostial disease on distal aortogram at time of cath, renal arteries appeared normal.  Then renal doppler u/s showed R>L RAS, renal sizes similar.  Recheck of renal artery dopplers 06/2013 showed no significant changes--repeat in 12 months recommended.   Right flank pain     apparently had a trigger point b/c once the orthopedist injected the region he had no further pain.  (extensive w/u done for this problem prior to the injection, though).    Past Surgical History:  Procedure Laterality Date   COLONOSCOPY  2007   Normal per pt.  Recall 2017 (done in [place], 2018).   CORONARY STENT PLACEMENT  09/14/09   RCA (Dr. [place])   KNEE SURGERY  2007   Rt knee arthroscopy   KNEE SURGERY  1983   Bilat knee debridement   PALATE SURGERY  age 31 mo   TYMPANOSTOMY TUBE PLACEMENT  1967    Family History  Problem Relation Age of Onset   Heart attack Father    Hypertension Father    Heart attack Sister    Hypertension Sister    Heart attack Brother    Hypertension Brother    Colon cancer Brother    Diabetes Neg Hx     Social History   Socioeconomic History   Marital status: Married    Spouse name: Not on file   Number of children: Not on file   Years of education: Not on file   Highest education level: Not on file  Occupational History   Not on file  Tobacco Use   Smoking status: Former    Types: Pipe   Smokeless tobacco: Never   Tobacco comments:  Pipe Only.  Vaping Use   Vaping Use: Never used  Substance and Sexual Activity   Alcohol use: No   Drug use: Yes    Types: Marijuana   Sexual activity: Not on file  Other Topics Concern   Not on file  Social History Narrative   Married, no children.   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   Education: BS from [employer].   No tobacco.  Rare use of marijuana (recreational).  No alcohol or drugs (other than marij).   Exercise: yard work, Systems analyst, [employer] and horseback riding.   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         Social Determinants of Health   Financial Resource Strain: Not on file  Food Insecurity: Not on file  Transportation Needs: Not on file  Physical  Activity: Not on file  Stress: Not on file  Social Connections: Not on file  Intimate Partner Violence: Not on file    Outpatient Medications Prior to Visit  Medication Sig Dispense Refill   aspirin 325 MG tablet Take 325 mg by mouth daily.     cetirizine (ZYRTEC) 10 MG tablet Take 1 tablet (10 mg total) by mouth daily. 30 tablet 11   fluticasone (CUTIVATE) 0.05 % cream APPLY TO RASH ON LOWER LEGS TWICE A DAY AS NEEDED 30 g 0   fluticasone (FLONASE) 50 MCG/ACT nasal spray Place 2 sprays into both nostrils daily. 16 g 6   hydrocortisone (ANUSOL-HC) 2.5 % rectal cream Place 1 application rectally 2 (two) times daily. 30 g 2   nitroGLYCERIN (NITROSTAT) 0.4 MG SL tablet Place 1 tablet (0.4 mg total) under the tongue every 5 (five) minutes as needed. (Patient not taking: Reported on 07/25/2021) 30 tablet 11   amLODipine (NORVASC) 10 MG tablet TAKE 1 TABLET BY MOUTH EVERY DAY 90 tablet 0   atorvastatin (LIPITOR) 80 MG tablet Take 1 tablet (80 mg total) by mouth daily. 90 tablet 3   ezetimibe (ZETIA) 10 MG tablet TAKE 1 TABLET BY MOUTH EVERY DAY 30 tablet 0   hydrochlorothiazide (HYDRODIURIL) 25 MG tablet TAKE 1 TABLET (25 MG TOTAL) BY MOUTH DAILY. 90 tablet 0   losartan (COZAAR) 100 MG tablet TAKE 1 TABLET BY MOUTH EVERY DAY 30 tablet 0   metFORMIN (GLUCOPHAGE) 1000 MG tablet Take 1 tablet (1,000 mg total) by mouth 2 (two) times daily with a meal. 60 tablet 3   metFORMIN (GLUCOPHAGE) 500 MG tablet TAKE 1 TABLET (500 MG TOTAL) BY MOUTH 2 (TWO) TIMES DAILY WITH A MEAL. 60 tablet 0   metoprolol succinate (TOPROL-XL) 100 MG 24 hr tablet TAKE ONE TAB BY MOUTH WITH OR IMMEDIATELY FLLOWING MEAL 90 tablet 0   No facility-administered medications prior to visit.    Allergies  Allergen Reactions   Orphenadrine Rash   Tramadol     Per pt: legs kick, trouble coming off   Keflex [Cephalexin] Rash   Penicillins Rash and Other (See Comments)    Angio-edema   ROS Review of Systems  Constitutional:   Negative for appetite change, chills, fatigue and fever.  HENT:  Negative for congestion, dental problem, ear pain and sore throat.   Eyes:  Negative for discharge, redness and visual disturbance.  Respiratory:  Negative for cough, chest tightness, shortness of breath and wheezing.   Cardiovascular:  Negative for chest pain, palpitations  and leg swelling.  Gastrointestinal:  Negative for abdominal pain, blood in stool, diarrhea, nausea and vomiting.  Genitourinary:  Negative for difficulty urinating, dysuria, flank pain, frequency, hematuria and urgency.  Musculoskeletal:  Negative for arthralgias, back pain, joint swelling, myalgias and neck stiffness.  Skin:  Negative for pallor and rash.  Neurological:  Negative for dizziness, speech difficulty, weakness and headaches.  Hematological:  Negative for adenopathy. Does not bruise/bleed easily.  Psychiatric/Behavioral:  Negative for confusion and sleep disturbance. The patient is not nervous/anxious.    PE; Vitals with BMI 07/25/2021 05/17/2021 04/18/2021  Height 5\' 7"  5\' 7"  5\' 7"   Weight 201 lbs 205 lbs 204 lbs 6 oz  BMI 31.47 32.1 32.01  Systolic 116 125 [id number]  Diastolic 77 80 78  Pulse 63 60 77   Gen: Alert, well appearing.  Patient is oriented to person, place, time, and situation. AFFECT: pleasant, lucid thought and speech. ENT: Ears: EACs clear, normal epithelium.  TMs with good light reflex and landmarks bilaterally.  Eyes: no injection, icteris, swelling, or exudate.  EOMI, PERRLA. Nose: no drainage or turbinate edema/swelling.  No injection or focal lesion.  Mouth: lips without lesion/swelling.  Oral mucosa pink and moist.  Dentition intact and without obvious caries or gingival swelling.  Oropharynx without erythema, exudate, or swelling.  Neck: supple/nontender.  No LAD, mass, or TM.  Carotid pulses 2+ bilaterally, without bruits. CV: RRR, no m/r/g.   LUNGS: CTA bilat, nonlabored resps, good aeration in all lung fields. ABD: soft, NT,  ND, BS normal.  No hepatospenomegaly or mass.  No bruits. EXT: no clubbing, cyanosis, or edema.  Musculoskeletal: no joint swelling, erythema, warmth, or tenderness.  ROM of all joints intact. Skin - no sores or suspicious lesions or rashes or color changes  Pertinent labs:  Lab Results  Component Value Date   TSH 2.05 11/25/2019   Lab Results  Component Value Date   WBC 6.0 11/25/2019   HGB 14.7 11/25/2019   HCT 44.0 11/25/2019   MCV 88.2 11/25/2019   PLT 182.0 11/25/2019   Lab Results  Component Value Date   CREATININE 1.05 04/18/2021   BUN 17 04/18/2021   NA 134 (L) 04/18/2021   K 3.5 04/18/2021   CL 94 (L) 04/18/2021   CO2 29 04/18/2021   Lab Results  Component Value Date   ALT 20 04/18/2021   AST 13 04/18/2021   ALKPHOS 47 04/18/2021   BILITOT 1.8 (H) 04/18/2021   Lab Results  Component Value Date   CHOL 213 (H) 04/18/2021   Lab Results  Component Value Date   HDL 41.20 04/18/2021   Lab Results  Component Value Date   LDLCALC 134 (H) 11/25/2019   Lab Results  Component Value Date   TRIG 288.0 (H) 04/18/2021   Lab Results  Component Value Date   CHOLHDL 5 04/18/2021   Lab Results  Component Value Date   PSA 0.4 05/09/2019   PSA 0.30 05/28/2018   PSA 0.36 07/09/2014   Lab Results  Component Value Date   HGBA1C 12.2 (H) 04/18/2021   ASSESSMENT AND PLAN:   1) DM 2: diet/exercise better.  Cont metformin 1000 bid. Hba1c and lytes/cr today.  2) HTN: cont toprol xl 100 qd, losartan 100 qd, amlodipine 10 qd, and hctz 25 qd. Lytes/cr today.  3) HLD: cont atorva 80 qd and zetia 10 qd. FLP and hepatic panel today.  4) Health maintenance exam: Reviewed age and gender appropriate health maintenance issues (prudent diet, regular  exercise, health risks of tobacco and excessive alcohol, use of seatbelts, fire alarms in home, use of sunscreen).  Also reviewed age and gender appropriate health screening as well as vaccine recommendations. Vaccines:  Prevnar 20->given today.  Tdap->deferred until next f/u b/c we're out of stock today.  Offered to send rx for Tdap to his pharmacy today but he declined. Labs: fasting HP, Hba1c, PSA. Prostate ca screening:  PSA today. Colon ca screening:  2007 normal TCS in [place], [place].  Due for recall->ordered GI referral today.  An After Visit Summary was printed and given to the patient.  FOLLOW UP:  No follow-ups on file.  Signed:  Santiago Bumpers, MD           07/25/2021

## 2021-07-25 NOTE — Patient Instructions (Signed)
Health Maintenance, Male Adopting a healthy lifestyle and getting preventive care are important in promoting health and wellness. Ask your health care provider about: The right schedule for you to have regular tests and exams. Things you can do on your own to prevent diseases and keep yourself healthy. What should I know about diet, weight, and exercise? Eat a healthy diet  Eat a diet that includes plenty of vegetables, fruits, low-fat dairy products, and lean protein. Do not eat a lot of foods that are high in solid fats, added sugars, or sodium.  Maintain a healthy weight Body mass index (BMI) is a measurement that can be used to identify possible weight problems. It estimates body fat based on height and weight. Your health care provider can help determine your BMI and help you achieve or maintain ahealthy weight. Get regular exercise Get regular exercise. This is one of the most important things you can do for your health. Most adults should: Exercise for at least 150 minutes each week. The exercise should increase your heart rate and make you sweat (moderate-intensity exercise). Do strengthening exercises at least twice a week. This is in addition to the moderate-intensity exercise. Spend less time sitting. Even light physical activity can be beneficial. Watch cholesterol and blood lipids Have your blood tested for lipids and cholesterol at 62 years of age, then havethis test every 5 years. You may need to have your cholesterol levels checked more often if: Your lipid or cholesterol levels are high. You are older than 62 years of age. You are at high risk for heart disease. What should I know about cancer screening? Many types of cancers can be detected early and may often be prevented. Depending on your health history and family history, you may need to have cancer screening at various ages. This may include screening for: Colorectal cancer. Prostate cancer. Skin cancer. Lung  cancer. What should I know about heart disease, diabetes, and high blood pressure? Blood pressure and heart disease High blood pressure causes heart disease and increases the risk of stroke. This is more likely to develop in people who have high blood pressure readings, are of African descent, or are overweight. Talk with your health care provider about your target blood pressure readings. Have your blood pressure checked: Every 3-5 years if you are 18-39 years of age. Every year if you are 40 years old or older. If you are between the ages of 65 and 75 and are a current or former smoker, ask your health care provider if you should have a one-time screening for abdominal aortic aneurysm (AAA). Diabetes Have regular diabetes screenings. This checks your fasting blood sugar level. Have the screening done: Once every three years after age 45 if you are at a normal weight and have a low risk for diabetes. More often and at a younger age if you are overweight or have a high risk for diabetes. What should I know about preventing infection? Hepatitis B If you have a higher risk for hepatitis B, you should be screened for this virus. Talk with your health care provider to find out if you are at risk forhepatitis B infection. Hepatitis C Blood testing is recommended for: Everyone born from 1945 through 1965. Anyone with known risk factors for hepatitis C. Sexually transmitted infections (STIs) You should be screened each year for STIs, including gonorrhea and chlamydia, if: You are sexually active and are younger than 62 years of age. You are older than 62 years of age   and your health care provider tells you that you are at risk for this type of infection. Your sexual activity has changed since you were last screened, and you are at increased risk for chlamydia or gonorrhea. Ask your health care provider if you are at risk. Ask your health care provider about whether you are at high risk for HIV.  Your health care provider may recommend a prescription medicine to help prevent HIV infection. If you choose to take medicine to prevent HIV, you should first get tested for HIV. You should then be tested every 3 months for as long as you are taking the medicine. Follow these instructions at home: Lifestyle Do not use any products that contain nicotine or tobacco, such as cigarettes, e-cigarettes, and chewing tobacco. If you need help quitting, ask your health care provider. Do not use street drugs. Do not share needles. Ask your health care provider for help if you need support or information about quitting drugs. Alcohol use Do not drink alcohol if your health care provider tells you not to drink. If you drink alcohol: Limit how much you have to 0-2 drinks a day. Be aware of how much alcohol is in your drink. In the [place] one drink equals one 12 oz bottle of beer (355 mL), one 5 oz glass of wine (148 mL), or one 1 oz glass of hard liquor (44 mL). General instructions Schedule regular health, dental, and eye exams. Stay current with your vaccines. Tell your health care provider if: You often feel depressed. You have ever been abused or do not feel safe at home. Summary Adopting a healthy lifestyle and getting preventive care are important in promoting health and wellness. Follow your health care provider's instructions about healthy diet, exercising, and getting tested or screened for diseases. Follow your health care provider's instructions on monitoring your cholesterol and blood pressure. This information is not intended to replace advice given to you by your health care provider. Make sure you discuss any questions you have with your healthcare provider. Document Revised: 11/20/2018 Document Reviewed: 11/20/2018 Elsevier Patient Education  2022 Elsevier Inc.  

## 2021-08-09 ENCOUNTER — Other Ambulatory Visit: Payer: Self-pay | Admitting: Family Medicine

## 2021-08-16 ENCOUNTER — Encounter: Payer: 59 | Admitting: Family Medicine

## 2021-09-19 ENCOUNTER — Other Ambulatory Visit: Payer: Self-pay

## 2021-09-19 ENCOUNTER — Ambulatory Visit (INDEPENDENT_AMBULATORY_CARE_PROVIDER_SITE_OTHER): Payer: 59

## 2021-09-19 DIAGNOSIS — Z23 Encounter for immunization: Secondary | ICD-10-CM

## 2022-02-08 ENCOUNTER — Ambulatory Visit: Payer: Managed Care, Other (non HMO) | Admitting: Family Medicine

## 2022-02-08 ENCOUNTER — Encounter: Payer: Self-pay | Admitting: Family Medicine

## 2022-02-08 ENCOUNTER — Other Ambulatory Visit: Payer: Self-pay

## 2022-02-08 VITALS — BP 142/78 | HR 83 | Temp 98.0°F | Ht 67.0 in | Wt 199.0 lb

## 2022-02-08 DIAGNOSIS — J180 Bronchopneumonia, unspecified organism: Secondary | ICD-10-CM

## 2022-02-08 DIAGNOSIS — U071 COVID-19: Secondary | ICD-10-CM

## 2022-02-08 MED ORDER — ALBUTEROL SULFATE HFA 108 (90 BASE) MCG/ACT IN AERS: 2.0000 | INHALATION_SPRAY | Freq: Four times a day (QID) | RESPIRATORY_TRACT | 0 refills | Status: AC | PRN

## 2022-02-08 MED ORDER — AZITHROMYCIN 250 MG PO TABS: ORAL_TABLET | ORAL | 0 refills | Status: DC

## 2022-02-08 MED ORDER — PREDNISONE 20 MG PO TABS: ORAL_TABLET | ORAL | 0 refills | Status: DC

## 2022-02-08 MED ORDER — HYDROCODONE BIT-HOMATROP MBR 5-1.5 MG/5ML PO SOLN: ORAL | 0 refills | Status: DC

## 2022-02-08 NOTE — Progress Notes (Signed)
OFFICE VISIT ? ?02/08/2022 ? ?CC:  ?Chief Complaint  ?Patient presents with  ? Sinus Congestion  ?  Pt also c/o postnasal drip. Tested positive for covid 8-9 days ago on 2/20 or 2/21. Completed Paxlovid rx yesterday and using Robitussin CF otc for the past 8-9 days as well.   ? ? ?Patient is a 60 y.o. male who presents for sinus congestion. ? ?HPI: ?About 8 or 9 days ago started to get nasal congestion and runny nose and cough. ?Felt fatigued.  COVID tested positive on 01/30/2022. ?Paxlovid was prescribed for him and he took this. ?Still having significant [place] cough, postnasal drip, feeling of some shortness of breath easily but none at rest.  Very fatigued.  No fevers throughout all of this.  He is eating and drinking well. ?Robitussin-CF not helping much. ? ?No headache, no sinus pain, no ear pain, no wheezing, no lower extremity edema.  No rash.  No abdominal pain. ? ? ?Past Medical History:  ?Diagnosis Date  ? Coronary artery disease   ? DES to RCA 2010 in setting of acute event; repeat cath 03/02/10 for chest pain showed widely patent stent and normal LV function.  ? COVID-19 virus infection 05/04/2021  ? DDD (degenerative disc disease), lumbar   ? Diabetes mellitus without complication (HCC) 2019  ? Diverticular disease   ? h/o 'itis  ? Fatty liver   ? Genital warts 06/23/2013  ? History of pancreatitis   ? History of peptic ulcer disease   ? Hyperlipidemia   ? Atorva 80. Recommended adding zetia 11/2019  ? Hypertension   ? Mood disorder (HCC)   ? per old records; anger and irritability + elevated libido--improved on sertraline.  ? Renal artery stenosis (HCC) 2011  ? Discordant data (mild bilat ostial disease on distal aortogram at time of cath, renal arteries appeared normal.  Then renal doppler u/s showed R>L RAS, renal sizes similar.  Recheck of renal artery dopplers 06/2013 showed no significant changes--repeat in 12 months recommended.  ? Right flank pain   ? apparently had a trigger point b/c once the  orthopedist injected the region he had no further pain.  (extensive w/u done for this problem prior to the injection, though).  ? ? ?Past Surgical History:  ?Procedure Laterality Date  ? COLONOSCOPY  2007  ? Normal per pt.  Recall 2017 (done in [place], [place]).  ? CORONARY STENT PLACEMENT  09/14/09  ? RCA (Dr. Clarene [place])  ? KNEE SURGERY  2007  ? Rt knee arthroscopy  ? KNEE SURGERY  1983  ? Bilat knee debridement  ? PALATE SURGERY  age 82 mo  ? TYMPANOSTOMY TUBE PLACEMENT  1967  ? ? ?Outpatient Medications Prior to Visit  ?Medication Sig Dispense Refill  ? amLODipine (NORVASC) 10 MG tablet Take 1 tablet (10 mg total) by mouth daily. 90 tablet 3  ? aspirin 325 MG tablet Take 325 mg by mouth daily.    ? atorvastatin (LIPITOR) 80 MG tablet Take 1 tablet (80 mg total) by mouth daily. 90 tablet 3  ? cetirizine (ZYRTEC) 10 MG tablet Take 1 tablet (10 mg total) by mouth daily. 30 tablet 11  ? ezetimibe (ZETIA) 10 MG tablet Take 1 tablet (10 mg total) by mouth daily. 90 tablet 3  ? hydrochlorothiazide (HYDRODIURIL) 25 MG tablet Take 1 tablet (25 mg total) by mouth daily. 90 tablet 3  ? losartan (COZAAR) 100 MG tablet Take 1 tablet (100 mg total) by mouth daily. 90 tablet 3  ?  metFORMIN (GLUCOPHAGE) 1000 MG tablet Take 1 tablet (1,000 mg total) by mouth 2 (two) times daily with a meal. 180 tablet 3  ? metoprolol succinate (TOPROL-XL) 100 MG 24 hr tablet Take with or immediately following a meal. 90 tablet 3  ? fluticasone (FLONASE) 50 MCG/ACT nasal spray Place 2 sprays into both nostrils daily. (Patient not taking: Reported on 02/08/2022) 16 g 6  ? hydrocortisone (ANUSOL-HC) 2.5 % rectal cream Place 1 application rectally 2 (two) times daily. (Patient not taking: Reported on 02/08/2022) 30 g 2  ? nitroGLYCERIN (NITROSTAT) 0.4 MG SL tablet Place 1 tablet (0.4 mg total) under the tongue every 5 (five) minutes as needed. (Patient not taking: Reported on 07/25/2021) 30 tablet 11  ? fluticasone (CUTIVATE) 0.05 % cream APPLY TO RASH ON LOWER  LEGS TWICE A DAY AS NEEDED (Patient not taking: Reported on 02/08/2022) 30 g 0  ? ?No facility-administered medications prior to visit.  ? ? ?Allergies  ?Allergen Reactions  ? Orphenadrine Rash  ? Tramadol   ?  Per pt: legs kick, trouble coming off  ? Keflex [Cephalexin] Rash  ? Penicillins Rash and Other (See Comments)  ?  Angio-edema  ? ? ?ROS ?As per HPI ? ?PE: ?Vitals with BMI 02/08/2022 07/25/2021 05/17/2021  ?Height 5\' 7"  5\' 7"  5\' 7"   ?Weight 199 lbs 201 lbs 205 lbs  ?BMI 31.16 31.47 32.1  ?Systolic 142 116  ?Diastolic 78 77 80  ?Pulse 83 63 60  ?02 sat 96% on RA ? ?Physical Exam ? ?VS: noted--normal. ?Gen: alert, NAD, NONTOXIC APPEARING--coughing frequently. ?HEENT: eyes without injection, drainage, or swelling.  Ears: EACs clear, TMs with normal light reflex and landmarks.  Nose: Clear rhinorrhea, with some dried, crusty exudate adherent to mildly injected mucosa.  No purulent d/c.  No paranasal sinus TTP.  No facial swelling.  Throat and mouth without focal lesion.  No pharyngial swelling, erythema, or exudate.   ?Neck: supple, no LAD.   ?LUNGS: CTA bilat, nonlabored resps.   ?CV: RRR, no m/r/g. ?EXT: no c/c/e ?SKIN: no rash ? ? ?LABS:  ?  Chemistry   ?   ?Component Value Date/Time  ? NA 142 07/25/2021 0913  ? K 4.9 07/25/2021 0913  ? CL 104 07/25/2021 0913  ? CO2 29 07/25/2021 0913  ? BUN 20 07/25/2021 0913  ? CREATININE 1.09 07/25/2021 0913  ? CREATININE 1.23 05/09/2019 0949  ?    ?Component Value Date/Time  ? CALCIUM 10.3 07/25/2021 0913  ? ALKPHOS 31 (L) 07/25/2021 0913  ? AST 12 07/25/2021 0913  ? ALT 10 07/25/2021 0913  ? BILITOT 0.8 07/25/2021 0913  ?  ? ?Lab Results  ?Component Value Date  ? HGBA1C 6.7 (H) 07/25/2021  ? ?IMPRESSION AND PLAN: ? ?COVID 19 respiratory illness, prolonged symptoms, suspect bacterial superinfection. ?Azithromycin x5 days. ?Prednisone 40 mg a day x5 days, then 20 mg a day x5 days. ?Albuterol inhaler, 2 puffs every 4-6 hours as needed. ?Continue Robitussin and daytime. ?For  bedtime cough he can use Hycodan 1 to 2 teaspoons, #120 mils sent. ? ?An After Visit Summary was printed and given to the patient. ? ?FOLLOW UP: Return if symptoms worsen or fail to improve. ? ?Signed:  07/27/2021, MD           02/08/2022 ? ?

## 2022-07-03 IMAGING — DX DG HIP (WITH OR WITHOUT PELVIS) 2-3V*R*
3 series · 3 of 3 positions shown · non-contrast
Comparison: None.

CLINICAL DATA: Pain without trauma

EXAM:
DG HIP (WITH OR WITHOUT PELVIS) 2-3V RIGHT

[pelvis ap]
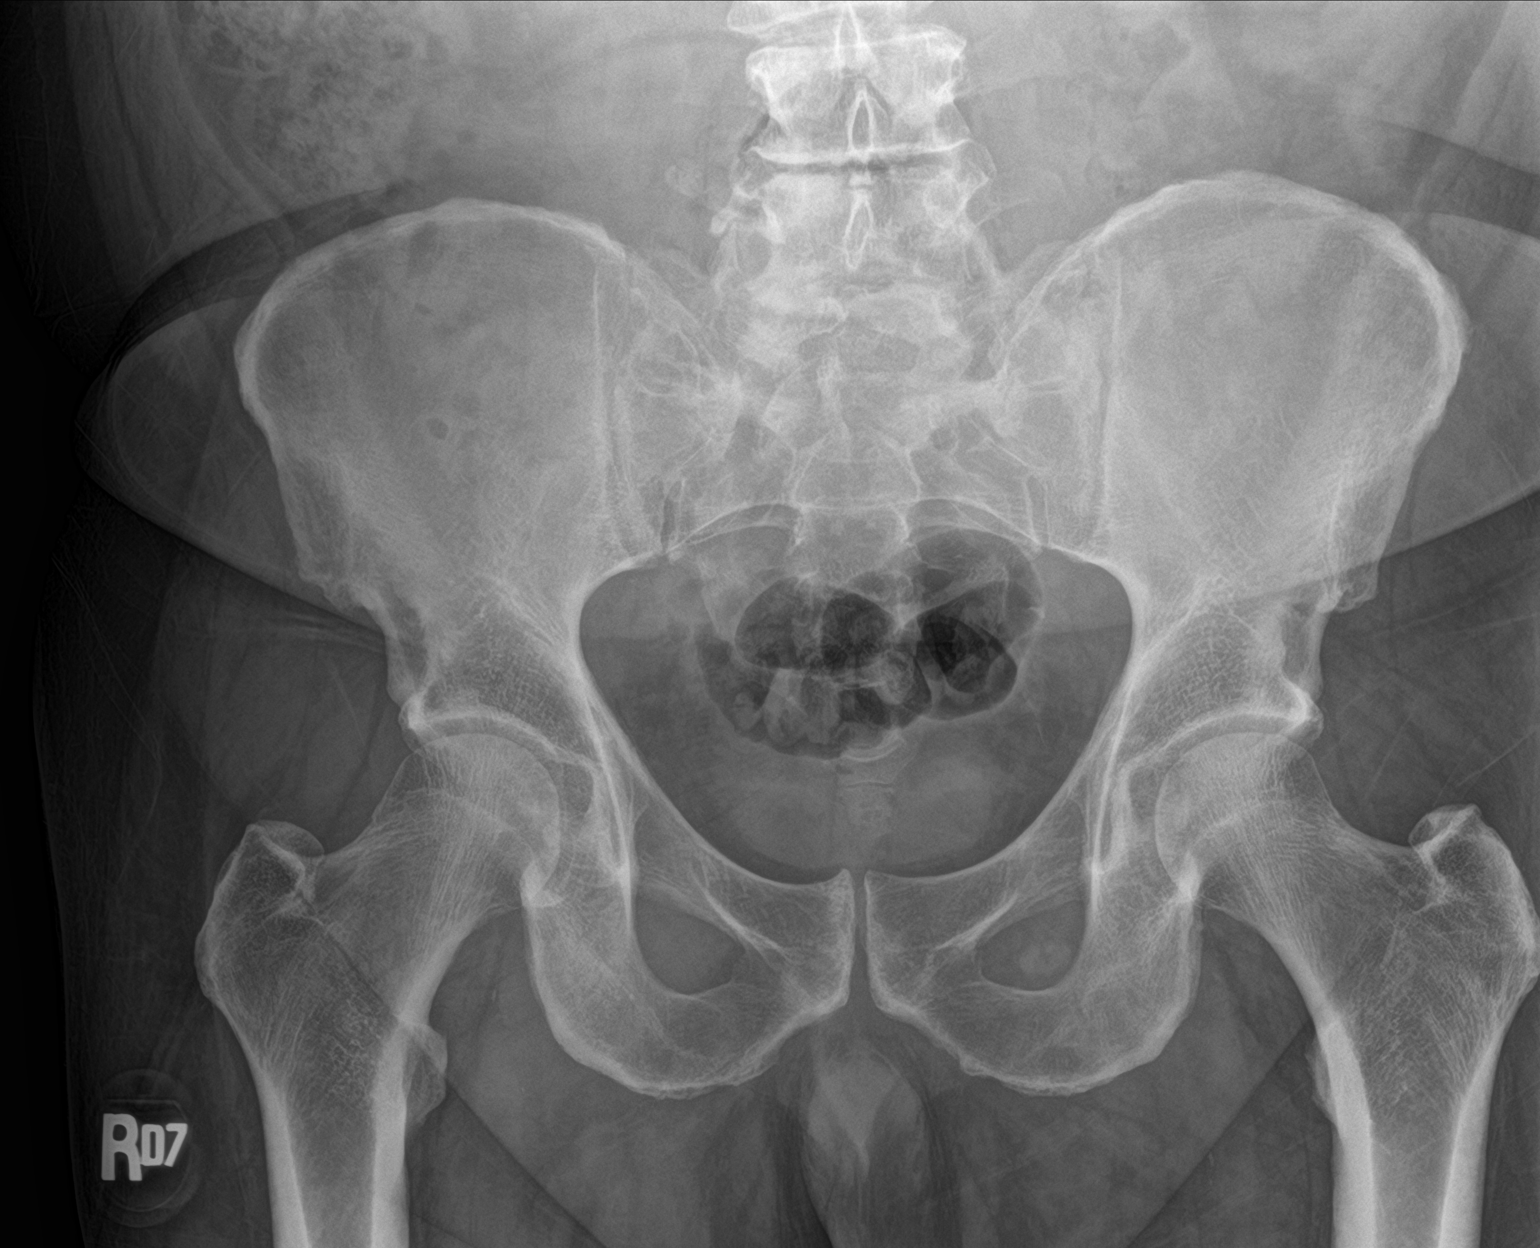

[hip ap]
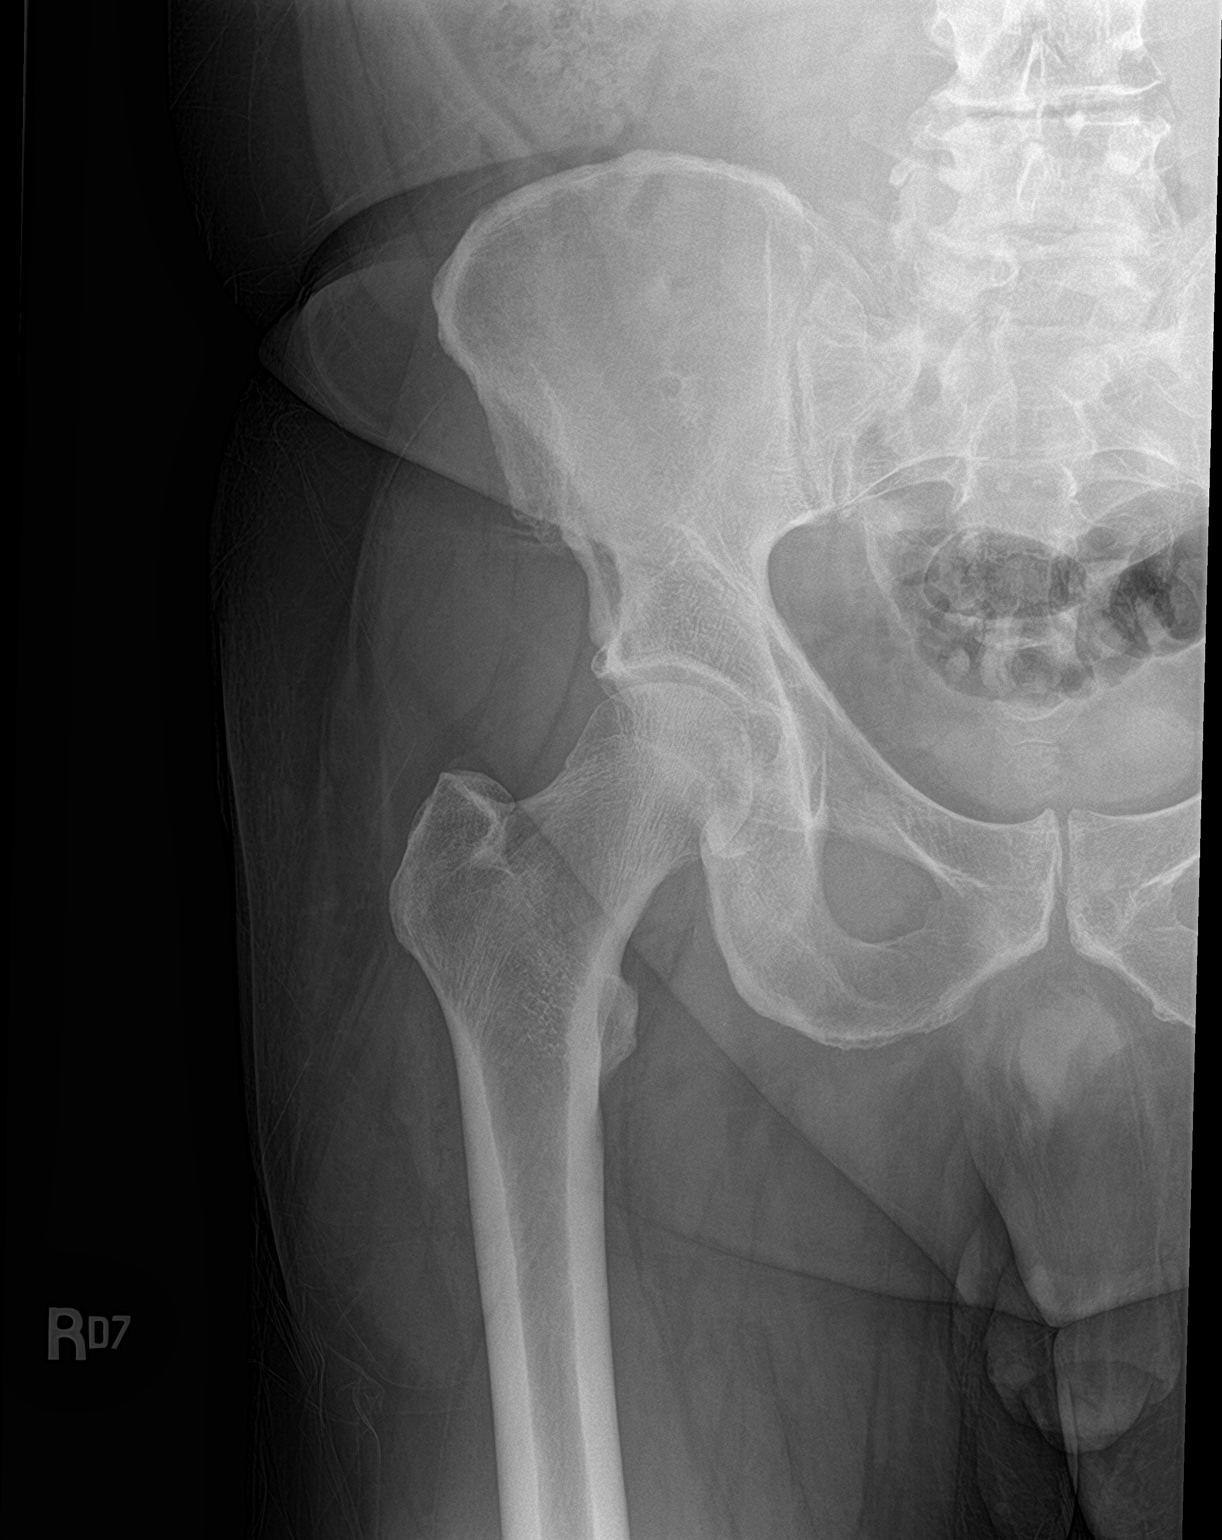

[hip lat]
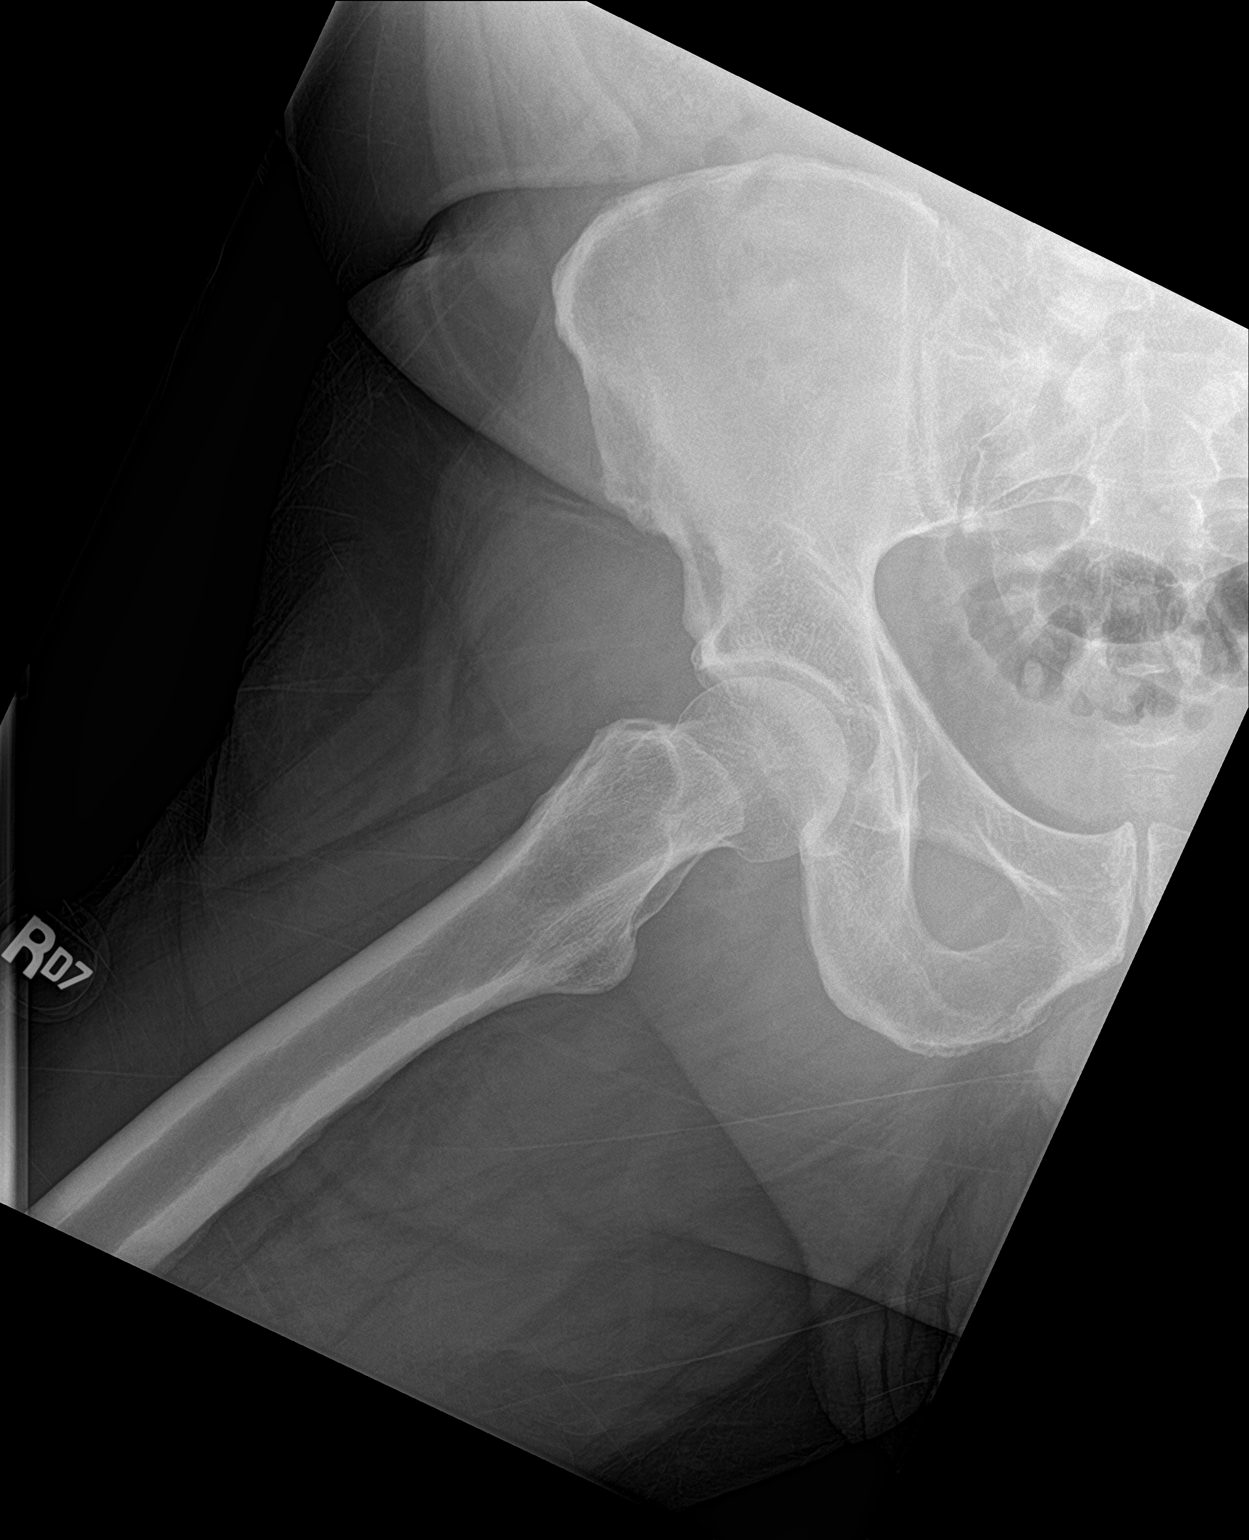

[3 of 3 positions shown; findings below may reference images not displayed]

FINDINGS: There is no evidence of hip fracture or dislocation. There is no
evidence of arthropathy or other focal bone abnormality.
IMPRESSION: Negative.

## 2022-08-01 ENCOUNTER — Other Ambulatory Visit: Payer: Self-pay | Admitting: Family Medicine

## 2022-08-01 NOTE — Telephone Encounter (Signed)
Patient was last seen 07/25/21 for CPE, advised to follow up 3 months. We can provide a temporary supply until appointment. He can schedule a routine follow up for blood pressure or next CPE. LM for patient to schedule.

## 2022-08-03 ENCOUNTER — Other Ambulatory Visit: Payer: Self-pay | Admitting: Family Medicine

## 2022-08-17 ENCOUNTER — Other Ambulatory Visit: Payer: Self-pay | Admitting: Family Medicine

## 2022-10-05 ENCOUNTER — Encounter: Payer: Self-pay | Admitting: Family Medicine

## 2022-10-05 ENCOUNTER — Ambulatory Visit: Payer: Managed Care, Other (non HMO) | Admitting: Family Medicine

## 2022-10-05 VITALS — BP 128/73 | HR 67 | Wt 209.8 lb

## 2022-10-05 DIAGNOSIS — B9689 Other specified bacterial agents as the cause of diseases classified elsewhere: Secondary | ICD-10-CM | POA: Diagnosis not present

## 2022-10-05 DIAGNOSIS — R059 Cough, unspecified: Secondary | ICD-10-CM | POA: Diagnosis not present

## 2022-10-05 DIAGNOSIS — J329 Chronic sinusitis, unspecified: Secondary | ICD-10-CM | POA: Diagnosis not present

## 2022-10-05 LAB — POC COVID19 BINAXNOW: SARS Coronavirus 2 Ag: NEGATIVE

## 2022-10-05 LAB — POCT INFLUENZA A/B
Influenza A, POC: NEGATIVE
Influenza B, POC: NEGATIVE

## 2022-10-05 MED ORDER — BENZONATATE 200 MG PO CAPS: 200.0000 mg | ORAL_CAPSULE | Freq: Two times a day (BID) | ORAL | 0 refills | Status: DC

## 2022-10-05 MED ORDER — IPRATROPIUM BROMIDE 0.06 % NA SOLN: 2.0000 | Freq: Four times a day (QID) | NASAL | 12 refills | Status: AC

## 2022-10-05 MED ORDER — DOXYCYCLINE HYCLATE 100 MG PO TABS: 100.0000 mg | ORAL_TABLET | Freq: Two times a day (BID) | ORAL | 0 refills | Status: DC

## 2022-10-05 NOTE — Progress Notes (Signed)
Eric Hunter , 06/07/1961, 61 y.o., male MRN: [id number] Patient Care Team    Relationship Specialty Notifications Start End  McGowen, Maryjean Morn, MD PCP - General Family Medicine  11/06/12   Runell Gess, MD Attending Physician Cardiology  11/06/12     Chief Complaint  Patient presents with   Cough    Started Monday/cough/HA/congestion     Subjective: Pt presents for an OV with complaints of cough, headache and nasal congestion of 4 days duration.   Pt has tried flonase to ease their symptoms.  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  He reports frontal headache, nasal drainage. GFR > 70.    04/18/2021    1:06 PM 11/25/2019    9:25 AM 05/28/2018   10:18 AM 02/14/2018    8:45 AM  Depression screen PHQ 2/9  Decreased Interest 0 0 0 0  Down, Depressed, Hopeless 0 0 1 0  PHQ - 2 Score 0 0 1 0  Altered sleeping   0 0  Tired, decreased energy   0 0  Change in appetite   2 0  Feeling bad or failure about yourself    1 0  Trouble concentrating   0 0  Moving slowly or fidgety/restless   0 0  Suicidal thoughts   0 1  PHQ-9 Score   4 1  Difficult doing work/chores   Not difficult at all Not difficult at all    Allergies  Allergen Reactions   Orphenadrine Rash   Tramadol     Per pt: legs kick, trouble coming off   Keflex [Cephalexin] Rash   Penicillins Rash and Other (See Comments)    Angio-edema   Social History   Social History Narrative   Married, no children.   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   Education: BS from [employer].   No tobacco.  Rare use of marijuana (recreational).  No alcohol or drugs (other than marij).   Exercise: yard work, Systems analyst, [employer] and horseback riding.   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         Past Medical History:  Diagnosis Date   Coronary artery disease    DES to RCA 2010 in setting of acute event;  repeat cath 03/02/10 for chest pain showed widely patent stent and normal LV function.   COVID-19 virus infection 05/04/2021   DDD (degenerative disc disease), lumbar    Diabetes mellitus without complication (HCC) 2019   Diverticular disease    h/o 'itis   Fatty liver    Genital warts 06/23/2013   History of pancreatitis    History of peptic ulcer disease    Hyperlipidemia    Atorva 80. Recommended adding zetia 11/2019   Hypertension    Mood disorder (HCC)    per old records; anger and irritability + elevated libido--improved on sertraline.   Renal artery stenosis (HCC) 2011   Discordant data (mild bilat ostial disease on distal aortogram at time of cath, renal arteries appeared normal.  Then renal doppler u/s showed R>L RAS, renal sizes similar.  Recheck of renal artery dopplers 06/2013 showed no significant changes--repeat in 12 months recommended.   Right flank pain    apparently had a trigger point b/c once the orthopedist injected the region he had no further pain.  (extensive w/u done  for this problem prior to the injection, though).   Past Surgical History:  Procedure Laterality Date   COLONOSCOPY  2007   Normal per pt.  Recall 2017 (done in [place], [place]).   CORONARY STENT PLACEMENT  09/14/09   RCA (Dr. Rex Kras)   KNEE SURGERY  2007   Rt knee arthroscopy   KNEE SURGERY  1983   Bilat knee debridement   PALATE SURGERY  age 28 mo   TYMPANOSTOMY TUBE PLACEMENT  1967   Family History  Problem Relation Age of Onset   Heart attack Father    Hypertension Father    Heart attack Sister    Hypertension Sister    Heart attack Brother    Hypertension Brother    Colon cancer Brother    Diabetes Neg Hx    Allergies as of 10/05/2022       Reactions   Orphenadrine Rash   Tramadol    Per pt: legs kick, trouble coming off   Keflex [cephalexin] Rash   Penicillins Rash, Other (See Comments)   Angio-edema        Medication List        Accurate as of October 05, 2022 11:20  AM. If you have any questions, ask your nurse or doctor.          STOP taking these medications    azithromycin 250 MG tablet Commonly known as: ZITHROMAX Stopped by: Howard Pouch, DO   hydrocortisone 2.5 % rectal cream Commonly known as: ANUSOL-HC Stopped by: Howard Pouch, DO   predniSONE 20 MG tablet Commonly known as: DELTASONE Stopped by: Howard Pouch, DO       TAKE these medications    albuterol 108 (90 Base) MCG/ACT inhaler Commonly known as: VENTOLIN HFA Inhale 2 puffs into the lungs every 6 (six) hours as needed for wheezing or shortness of breath.   amLODipine 10 MG tablet Commonly known as: NORVASC TAKE 1 TABLET BY MOUTH EVERY DAY   aspirin 325 MG tablet Take 325 mg by mouth daily.   atorvastatin 80 MG tablet Commonly known as: LIPITOR TAKE 1 TABLET BY MOUTH EVERY DAY   benzonatate 200 MG capsule Commonly known as: TESSALON Take 1 capsule (200 mg total) by mouth 2 (two) times daily. Started by: Howard Pouch, DO   cetirizine 10 MG tablet Commonly known as: ZYRTEC Take 1 tablet (10 mg total) by mouth daily.   doxycycline 100 MG tablet Commonly known as: VIBRA-TABS Take 1 tablet (100 mg total) by mouth 2 (two) times daily. Started by: Howard Pouch, DO   ezetimibe 10 MG tablet Commonly known as: ZETIA TAKE 1 TABLET BY MOUTH EVERY DAY   fluticasone 50 MCG/ACT nasal spray Commonly known as: FLONASE Place 2 sprays into both nostrils daily.   hydrochlorothiazide 25 MG tablet Commonly known as: HYDRODIURIL TAKE 1 TABLET (25 MG TOTAL) BY MOUTH DAILY.   HYDROcodone bit-homatropine 5-1.5 MG/5ML syrup Commonly known as: HYCODAN 1-2 tsp po qhs prn cough   ipratropium 0.06 % nasal spray Commonly known as: ATROVENT Place 2 sprays into both nostrils 4 (four) times daily. Started by: Howard Pouch, DO   losartan 100 MG tablet Commonly known as: COZAAR Take 1 tablet (100 mg total) by mouth daily.   metFORMIN 1000 MG tablet Commonly known as:  GLUCOPHAGE Take 1 tablet (1,000 mg total) by mouth 2 (two) times daily with a meal.   metoprolol succinate 100 MG 24 hr tablet Commonly known as: TOPROL-XL Take 1 tablet with or immediately following  a meal.   nitroGLYCERIN 0.4 MG SL tablet Commonly known as: NITROSTAT Place 1 tablet (0.4 mg total) under the tongue every 5 (five) minutes as needed.        All past medical history, surgical history, allergies, family history, immunizations andmedications were updated in the EMR today and reviewed under the history and medication portions of their EMR.     Review of Systems  Constitutional:  Positive for malaise/fatigue. Negative for chills and fever.  HENT:  Positive for congestion and sinus pain. Negative for ear discharge, ear pain and sore throat.   Eyes:  Negative for discharge and redness.  Respiratory:  Positive for cough and sputum production. Negative for shortness of breath and wheezing.   Gastrointestinal:  Negative for abdominal pain, nausea and vomiting.  Musculoskeletal:  Positive for myalgias.  Skin:  Negative for rash.  Neurological:  Positive for headaches. Negative for dizziness.   Negative, with the exception of above mentioned in HPI   Objective:  BP 128/73   Pulse 67   Wt 209 lb 12.8 oz (95.2 kg)   SpO2 99%   BMI 32.86 kg/m  Body mass index is 32.86 kg/m. Physical Exam Vitals and nursing note reviewed.  Constitutional:      General: He is not in acute distress.    Appearance: Normal appearance. He is not ill-appearing, toxic-appearing or diaphoretic.  HENT:     Head: Normocephalic and atraumatic.     Right Ear: Tympanic membrane and ear canal normal.     Left Ear: Tympanic membrane and ear canal normal.     Nose: Congestion and rhinorrhea present.     Comments: PND present    Mouth/Throat:     Mouth: Mucous membranes are moist.     Pharynx: No oropharyngeal exudate or posterior oropharyngeal erythema.  Eyes:     General: No scleral icterus.        Right eye: No discharge.        Left eye: No discharge.     Extraocular Movements: Extraocular movements intact.     Pupils: Pupils are equal, round, and reactive to light.  Cardiovascular:     Rate and Rhythm: Normal rate and regular rhythm.  Pulmonary:     Effort: Pulmonary effort is normal. No respiratory distress.     Breath sounds: Normal breath sounds. No wheezing, rhonchi or rales.  Musculoskeletal:     Cervical back: Neck supple.  Lymphadenopathy:     Cervical: Cervical adenopathy present.  Skin:    General: Skin is warm and dry.     Coloration: Skin is not jaundiced or pale.     Findings: No rash.  Neurological:     Mental Status: He is alert and oriented to person, place, and time. Mental status is at baseline.  Psychiatric:        Mood and Affect: Mood normal.        Behavior: Behavior normal.        Thought Content: Thought content normal.        Judgment: Judgment normal.     No results found. No results found. No results found for this or any previous visit (from the past 24 hour(s)).  Assessment/Plan: Eric Hunter is a 61 y.o. male present for OV for   Bacterial sinusitis/Cough, unspecified type Signs and symptoms consistent with bacterial sinus infection.  - POCT Influenza A/B> negative - POC COVID-19 BinaxNow> negative Rest, hydrate.  +/- flonase, mucinex (DM if cough), nettie pot or  nasal saline.  Doxy BID prescribed, take until completed.  Tessalon perles for cough.  Atrovent nasal spray for PND If cough present it can last up to 6-8 weeks.  F/U 2 weeks if not improved.   Reviewed expectations re: course of current medical issues. Discussed self-management of symptoms. Outlined signs and symptoms indicating need for more acute intervention. Patient verbalized understanding and all questions were answered. Patient received an After-Visit Summary.    Orders Placed This Encounter  Procedures   POCT Influenza A/B   POC COVID-19  BinaxNow   Meds ordered this encounter  Medications   doxycycline (VIBRA-TABS) 100 MG tablet    Sig: Take 1 tablet (100 mg total) by mouth 2 (two) times daily.    Dispense:  20 tablet    Refill:  0   ipratropium (ATROVENT) 0.06 % nasal spray    Sig: Place 2 sprays into both nostrils 4 (four) times daily.    Dispense:  15 mL    Refill:  12   benzonatate (TESSALON) 200 MG capsule    Sig: Take 1 capsule (200 mg total) by mouth 2 (two) times daily.    Dispense:  20 capsule    Refill:  0   Referral Orders  No referral(s) requested today     Note is dictated utilizing voice recognition software. Although note has been proof read prior to signing, occasional typographical errors still can be missed. If any questions arise, please do not hesitate to call for verification.   electronically signed by:  Felix Pacini, DO  [facility] [place]

## 2022-10-05 NOTE — Patient Instructions (Signed)

## 2022-11-11 ENCOUNTER — Other Ambulatory Visit: Payer: Self-pay | Admitting: Family Medicine

## 2022-12-06 NOTE — Progress Notes (Signed)
This encounter was created in error - please disregard.

## 2022-12-10 ENCOUNTER — Other Ambulatory Visit: Payer: Self-pay | Admitting: Family Medicine

## 2022-12-20 ENCOUNTER — Other Ambulatory Visit: Payer: Self-pay | Admitting: Family Medicine

## 2022-12-26 ENCOUNTER — Other Ambulatory Visit: Payer: Self-pay | Admitting: Family Medicine

## 2022-12-30 ENCOUNTER — Other Ambulatory Visit: Payer: Self-pay | Admitting: Family Medicine

## 2023-01-01 ENCOUNTER — Telehealth: Payer: Self-pay

## 2023-01-01 MED ORDER — METOPROLOL SUCCINATE ER 100 MG PO TB24: ORAL_TABLET | ORAL | 0 refills | Status: DC

## 2023-01-01 MED ORDER — HYDROCHLOROTHIAZIDE 25 MG PO TABS: 25.0000 mg | ORAL_TABLET | Freq: Every day | ORAL | 0 refills | Status: DC

## 2023-01-01 MED ORDER — LOSARTAN POTASSIUM 100 MG PO TABS: 100.0000 mg | ORAL_TABLET | Freq: Every day | ORAL | 0 refills | Status: DC

## 2023-01-01 NOTE — Telephone Encounter (Signed)
Patient refill request.  Patient needs enough meds to get him to appt on 1/29 with Dr. Anitra Lauth.  Offered earlier appt this week, he declined.  [facility]  losartan (COZAAR) 100 MG tablet   hydrochlorothiazide (HYDRODIURIL) 25 MG tablet   metoprolol succinate (TOPROL-XL) 100 MG 24 hr tablet

## 2023-01-01 NOTE — Telephone Encounter (Signed)
30 days supply given for meds.

## 2023-01-08 ENCOUNTER — Encounter: Payer: Self-pay | Admitting: Family Medicine

## 2023-01-08 ENCOUNTER — Ambulatory Visit: Payer: BLUE CROSS/BLUE SHIELD | Admitting: Family Medicine

## 2023-01-08 VITALS — BP 140/72 | HR 72 | Temp 98.0°F | Ht 67.0 in | Wt 202.2 lb

## 2023-01-08 DIAGNOSIS — Z125 Encounter for screening for malignant neoplasm of prostate: Secondary | ICD-10-CM | POA: Diagnosis not present

## 2023-01-08 DIAGNOSIS — I10 Essential (primary) hypertension: Secondary | ICD-10-CM

## 2023-01-08 DIAGNOSIS — E7800 Pure hypercholesterolemia, unspecified: Secondary | ICD-10-CM | POA: Diagnosis not present

## 2023-01-08 DIAGNOSIS — E119 Type 2 diabetes mellitus without complications: Secondary | ICD-10-CM | POA: Diagnosis not present

## 2023-01-08 DIAGNOSIS — Z0000 Encounter for general adult medical examination without abnormal findings: Secondary | ICD-10-CM | POA: Diagnosis not present

## 2023-01-08 DIAGNOSIS — Z23 Encounter for immunization: Secondary | ICD-10-CM | POA: Diagnosis not present

## 2023-01-08 DIAGNOSIS — Z1211 Encounter for screening for malignant neoplasm of colon: Secondary | ICD-10-CM | POA: Diagnosis not present

## 2023-01-08 LAB — MICROALBUMIN / CREATININE URINE RATIO
Creatinine,U: 145.2 mg/dL
Microalb Creat Ratio: 1.3 mg/g (ref 0.0–30.0)
Microalb, Ur: 1.9 mg/dL (ref 0.0–1.9)

## 2023-01-08 LAB — HEMOGLOBIN A1C: Hgb A1c MFr Bld: 10.4 % — ABNORMAL HIGH (ref 4.6–6.5)

## 2023-01-08 LAB — COMPREHENSIVE METABOLIC PANEL
ALT: 19 U/L (ref 0–53)
AST: 14 U/L (ref 0–37)
Albumin: 4.6 g/dL (ref 3.5–5.2)
Alkaline Phosphatase: 57 U/L (ref 39–117)
BUN: 18 mg/dL (ref 6–23)
CO2: 31 mEq/L (ref 19–32)
Calcium: 10.4 mg/dL (ref 8.4–10.5)
Chloride: 97 mEq/L (ref 96–112)
Creatinine, Ser: 1.08 mg/dL (ref 0.40–1.50)
GFR: 72.88 mL/min (ref >60.00)
Glucose, Bld: 319 mg/dL — ABNORMAL HIGH (ref 70–99)
Potassium: 4.4 mEq/L (ref 3.5–5.1)
Sodium: 137 mEq/L (ref 135–145)
Total Bilirubin: 1.4 mg/dL — ABNORMAL HIGH (ref 0.2–1.2)
Total Protein: 7.6 g/dL (ref 6.0–8.3)

## 2023-01-08 LAB — PSA: PSA: 0.29 ng/mL (ref 0.10–4.00)

## 2023-01-08 LAB — LIPID PANEL
Cholesterol: 127 mg/dL (ref 0–200)
HDL: 40.8 mg/dL (ref >39.00)
LDL Cholesterol: 52 mg/dL (ref 0–99)
NonHDL: 85.91
Total CHOL/HDL Ratio: 3
Triglycerides: 168 mg/dL — ABNORMAL HIGH (ref 0.0–149.0)
VLDL: 33.6 mg/dL (ref 0.0–40.0)

## 2023-01-08 LAB — TSH: TSH: 1.87 u[IU]/mL (ref 0.35–5.50)

## 2023-01-08 MED ORDER — AMLODIPINE BESYLATE 10 MG PO TABS: 10.0000 mg | ORAL_TABLET | Freq: Every day | ORAL | 1 refills | Status: DC

## 2023-01-08 MED ORDER — EZETIMIBE 10 MG PO TABS: 10.0000 mg | ORAL_TABLET | Freq: Every day | ORAL | 1 refills | Status: DC

## 2023-01-08 MED ORDER — METFORMIN HCL 1000 MG PO TABS: 1000.0000 mg | ORAL_TABLET | Freq: Two times a day (BID) | ORAL | 1 refills | Status: DC

## 2023-01-08 MED ORDER — METOPROLOL SUCCINATE ER 100 MG PO TB24: ORAL_TABLET | ORAL | 1 refills | Status: DC

## 2023-01-08 MED ORDER — HYDROCHLOROTHIAZIDE 25 MG PO TABS: 25.0000 mg | ORAL_TABLET | Freq: Every day | ORAL | 1 refills | Status: DC

## 2023-01-08 MED ORDER — ATORVASTATIN CALCIUM 80 MG PO TABS: 80.0000 mg | ORAL_TABLET | Freq: Every day | ORAL | 1 refills | Status: DC

## 2023-01-08 MED ORDER — LOSARTAN POTASSIUM 100 MG PO TABS: 100.0000 mg | ORAL_TABLET | Freq: Every day | ORAL | 1 refills | Status: DC

## 2023-01-08 NOTE — Patient Instructions (Signed)
Health Maintenance, Male Adopting a healthy lifestyle and getting preventive care are important in promoting health and wellness. Ask your health care provider about: The right schedule for you to have regular tests and exams. Things you can do on your own to prevent diseases and keep yourself healthy. What should I know about diet, weight, and exercise? Eat a healthy diet  Eat a diet that includes plenty of vegetables, fruits, low-fat dairy products, and lean protein. Do not eat a lot of foods that are high in solid fats, added sugars, or sodium. Maintain a healthy weight Body mass index (BMI) is a measurement that can be used to identify possible weight problems. It estimates body fat based on height and weight. Your health care provider can help determine your BMI and help you achieve or maintain a healthy weight. Get regular exercise Get regular exercise. This is one of the most important things you can do for your health. Most adults should: Exercise for at least 150 minutes each week. The exercise should increase your heart rate and make you sweat (moderate-intensity exercise). Do strengthening exercises at least twice a week. This is in addition to the moderate-intensity exercise. Spend less time sitting. Even light physical activity can be beneficial. Watch cholesterol and blood lipids Have your blood tested for lipids and cholesterol at 64 years of age, then have this test every 5 years. You may need to have your cholesterol levels checked more often if: Your lipid or cholesterol levels are high. You are older than 64 years of age. You are at high risk for heart disease. What should I know about cancer screening? Many types of cancers can be detected early and may often be prevented. Depending on your health history and family history, you may need to have cancer screening at various ages. This may include screening for: Colorectal cancer. Prostate cancer. Skin cancer. Lung  cancer. What should I know about heart disease, diabetes, and high blood pressure? Blood pressure and heart disease High blood pressure causes heart disease and increases the risk of stroke. This is more likely to develop in people who have high blood pressure readings or are overweight. Talk with your health care provider about your target blood pressure readings. Have your blood pressure checked: Every 3-5 years if you are 18-39 years of age. Every year if you are 40 years old or older. If you are between the ages of 65 and 75 and are a current or former smoker, ask your health care provider if you should have a one-time screening for abdominal aortic aneurysm (AAA). Diabetes Have regular diabetes screenings. This checks your fasting blood sugar level. Have the screening done: Once every three years after age 45 if you are at a normal weight and have a low risk for diabetes. More often and at a younger age if you are overweight or have a high risk for diabetes. What should I know about preventing infection? Hepatitis B If you have a higher risk for hepatitis B, you should be screened for this virus. Talk with your health care provider to find out if you are at risk for hepatitis B infection. Hepatitis C Blood testing is recommended for: Everyone born from 1945 through 1965. Anyone with known risk factors for hepatitis C. Sexually transmitted infections (STIs) You should be screened each year for STIs, including gonorrhea and chlamydia, if: You are sexually active and are younger than 64 years of age. You are older than 64 years of age and your   health care provider tells you that you are at risk for this type of infection. Your sexual activity has changed since you were last screened, and you are at increased risk for chlamydia or gonorrhea. Ask your health care provider if you are at risk. Ask your health care provider about whether you are at high risk for HIV. Your health care provider  may recommend a prescription medicine to help prevent HIV infection. If you choose to take medicine to prevent HIV, you should first get tested for HIV. You should then be tested every 3 months for as long as you are taking the medicine. Follow these instructions at home: Alcohol use Do not drink alcohol if your health care provider tells you not to drink. If you drink alcohol: Limit how much you have to 0-2 drinks a day. Know how much alcohol is in your drink. In the [place] one drink equals one 12 oz bottle of beer (355 mL), one 5 oz glass of wine (148 mL), or one 1 oz glass of hard liquor (44 mL). Lifestyle Do not use any products that contain nicotine or tobacco. These products include cigarettes, chewing tobacco, and vaping devices, such as e-cigarettes. If you need help quitting, ask your health care provider. Do not use street drugs. Do not share needles. Ask your health care provider for help if you need support or information about quitting drugs. General instructions Schedule regular health, dental, and eye exams. Stay current with your vaccines. Tell your health care provider if: You often feel depressed. You have ever been abused or do not feel safe at home. Summary Adopting a healthy lifestyle and getting preventive care are important in promoting health and wellness. Follow your health care provider's instructions about healthy diet, exercising, and getting tested or screened for diseases. Follow your health care provider's instructions on monitoring your cholesterol and blood pressure. This information is not intended to replace advice given to you by your health care provider. Make sure you discuss any questions you have with your health care provider. Document Revised: 04/18/2021 Document Reviewed: 04/18/2021 Elsevier Patient Education  2023 [employer]  

## 2023-01-08 NOTE — [facility] Notes (Signed)
Office Note 01/08/2023  CC:  Chief Complaint  Patient presents with   Medical Management of Chronic Issues    Pt is fasting   Patient is a 62 y.o. male who is here for annual health maintenance exam and f/u DM, HTN, and HLD.  INTERIM HX: Feeling well. Working long hours. Has some extra stress lately, taking care of a family member with drug abuse issues.  He's not monitoring bp. Glucoses 110-115 lately. Compliant with meds but has not taken any yet today.  Past Medical History:  Diagnosis Date   Coronary artery disease    DES to RCA 2010 in setting of acute event; repeat cath 03/02/10 for chest pain showed widely patent stent and normal LV function.   COVID-19 virus infection 05/04/2021   DDD (degenerative disc disease), lumbar    Diabetes mellitus without complication ([place]) 1610   Diverticular disease    h/o 'itis   Fatty liver    Genital warts 06/23/2013   History of pancreatitis    History of peptic ulcer disease    Hyperlipidemia    Atorva 80. Recommended adding zetia 11/2019   Hypertension    Mood disorder ([place])    per old records; anger and irritability + elevated libido--improved on sertraline.   Renal artery stenosis ([place]) 2011   Discordant data (mild bilat ostial disease on distal aortogram at time of cath, renal arteries appeared normal.  Then renal doppler u/s showed R>L RAS, renal sizes similar.  Recheck of renal artery dopplers 06/2013 showed no significant changes--repeat in 12 months recommended.   Right flank pain    apparently had a trigger point b/c once the orthopedist injected the region he had no further pain.  (extensive w/u done for this problem prior to the injection, though).    Past Surgical History:  Procedure Laterality Date   COLONOSCOPY  2007   Normal per pt.  Recall 2017 (done in [place], [place]).   CORONARY STENT PLACEMENT  09/14/09   RCA (Dr. Rex Kras)   [place]  2007   Rt knee arthroscopy   KNEE SURGERY  1983   Bilat knee  debridement   PALATE SURGERY  age 59 mo   TYMPANOSTOMY TUBE PLACEMENT  1967    Family History  Problem Relation Age of Onset   Heart attack Father    Hypertension Father    Heart attack Sister    Hypertension Sister    Heart attack Brother    Hypertension Brother    Colon cancer Brother    Diabetes Neg Hx     Social History   Socioeconomic History   Marital status: Married    Spouse name: Not on file   Number of children: Not on file   Years of education: Not on file   Highest education level: Not on file  Occupational History   Not on file  Tobacco Use   Smoking status: Former    Types: Pipe   Smokeless tobacco: Never   Tobacco comments:    Pipe Only.  Vaping Use   Vaping Use: Never used  Substance and Sexual Activity   Alcohol use: No   Drug use: Yes    Types: Marijuana   Sexual activity: Not on file  Other Topics Concern   Not on file  Social History Narrative   Married, no children.   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   Education: BS from [employer].   No  tobacco.  Rare use of marijuana (recreational).  No alcohol or drugs (other than marij).   Exercise: yard work, Systems analyst, [employer] and horseback riding.   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         Social Determinants of Health   Financial Resource Strain: Not on file  Food Insecurity: Not on file  Transportation Needs: Not on file  Physical Activity: Not on file  Stress: Not on file  Social Connections: Not on file  Intimate Partner Violence: Not on file    Outpatient Medications Prior to Visit  Medication Sig Dispense Refill   aspirin 325 MG tablet Take 325 mg by mouth daily.     cetirizine (ZYRTEC) 10 MG tablet Take 1 tablet (10 mg total) by mouth daily. 30 tablet 11   fluticasone (FLONASE) 50 MCG/ACT nasal spray Place 2 sprays into both nostrils daily. 16 g 6    ipratropium (ATROVENT) 0.06 % nasal spray Place 2 sprays into both nostrils 4 (four) times daily. 15 mL 12   albuterol (VENTOLIN HFA) 108 (90 Base) MCG/ACT inhaler Inhale 2 puffs into the lungs every 6 (six) hours as needed for wheezing or shortness of breath. (Patient not taking: Reported on 01/08/2023) 1 each 0   nitroGLYCERIN (NITROSTAT) 0.4 MG SL tablet Place 1 tablet (0.4 mg total) under the tongue every 5 (five) minutes as needed. (Patient not taking: Reported on 01/08/2023) 30 tablet 11   amLODipine (NORVASC) 10 MG tablet TAKE 1 TABLET BY MOUTH EVERY DAY 30 tablet 0   atorvastatin (LIPITOR) 80 MG tablet TAKE 1 TABLET BY MOUTH EVERY DAY 30 tablet 0   benzonatate (TESSALON) 200 MG capsule Take 1 capsule (200 mg total) by mouth 2 (two) times daily. 20 capsule 0   doxycycline (VIBRA-TABS) 100 MG tablet Take 1 tablet (100 mg total) by mouth 2 (two) times daily. 20 tablet 0   ezetimibe (ZETIA) 10 MG tablet TAKE 1 TABLET BY MOUTH EVERY DAY (Patient not taking: Reported on 01/08/2023) 30 tablet 0   hydrochlorothiazide (HYDRODIURIL) 25 MG tablet Take 1 tablet (25 mg total) by mouth daily. 30 tablet 0   HYDROcodone bit-homatropine (HYCODAN) 5-1.5 MG/5ML syrup 1-2 tsp po qhs prn cough (Patient not taking: Reported on 10/05/2022) 120 mL 0   losartan (COZAAR) 100 MG tablet Take 1 tablet (100 mg total) by mouth daily. 30 tablet 0   metFORMIN (GLUCOPHAGE) 1000 MG tablet Take 1 tablet (1,000 mg total) by mouth 2 (two) times daily with a meal. 180 tablet 3   metoprolol succinate (TOPROL-XL) 100 MG 24 hr tablet Take with or immediately following a meal. 30 tablet 0   No facility-administered medications prior to visit.    Allergies  Allergen Reactions   Orphenadrine Rash   Tramadol     Per pt: legs kick, trouble coming off   Keflex [Cephalexin] Rash   Penicillins Rash and Other (See Comments)    Angio-edema    Review of Systems  Constitutional:  Negative for appetite change, chills, fatigue and fever.   HENT:  Negative for congestion, dental problem, ear pain and sore throat.   Eyes:  Negative for discharge, redness and visual disturbance.  Respiratory:  Negative for cough, chest tightness, shortness of breath and wheezing.   Cardiovascular:  Negative for chest pain, palpitations and leg swelling.  Gastrointestinal:  Negative for abdominal pain, blood in stool, diarrhea, nausea and vomiting.  Genitourinary:  Negative for difficulty urinating, dysuria, flank pain, frequency, hematuria and urgency.  Musculoskeletal:  Negative for arthralgias, back pain, joint swelling, myalgias and neck stiffness.  Skin:  Negative for pallor and rash.  Neurological:  Negative for dizziness, speech difficulty, weakness and headaches.  Hematological:  Negative for adenopathy. Does not bruise/bleed easily.  Psychiatric/Behavioral:  Negative for confusion and sleep disturbance. The patient is not nervous/anxious.     PE;    01/08/2023    8:43 AM 10/05/2022   10:56 AM 10/05/2022   10:53 AM  Vitals with BMI  Height 5\' 7"     Weight 202 lbs 3 oz  209 lbs 13 oz  BMI 31.66    Systolic 149 128  Diastolic 80 73 90  Pulse 72  67    Gen: Alert, well appearing.  Patient is oriented to person, place, time, and situation. AFFECT: pleasant, lucid thought and speech. ENT: Ears: EACs clear, normal epithelium.  TMs with good light reflex and landmarks bilaterally.  Eyes: no injection, icteris, swelling, or exudate.  EOMI, PERRLA. Nose: no drainage or turbinate edema/swelling.  No injection or focal lesion.  Mouth: lips without lesion/swelling.  Oral mucosa pink and moist.  Dentition intact and without obvious caries or gingival swelling.  Oropharynx without erythema, exudate, or swelling.  Neck: supple/nontender.  No LAD, mass, or TM.  Carotid pulses 2+ bilaterally, without bruits. CV: RRR, no m/r/g.   LUNGS: CTA bilat, nonlabored resps, good aeration in all lung fields. ABD: soft, NT, ND, BS normal.  No  hepatospenomegaly or mass.  No bruits. EXT: no clubbing, cyanosis, or edema.  Musculoskeletal: no joint swelling, erythema, warmth, or tenderness.  ROM of all joints intact. Skin - no sores or suspicious lesions or rashes or color changes Foot exam -  no swelling, tenderness or skin or vascular lesions. Color and temperature is normal. Sensation is intact. Peripheral pulses are palpable. Toenails are normal.  Pertinent labs:  Lab Results  Component Value Date   TSH 1.21 07/25/2021   Lab Results  Component Value Date   WBC 6.5 07/25/2021   HGB 13.9 07/25/2021   HCT 42.0 07/25/2021   MCV 86.4 07/25/2021   PLT 190.0 07/25/2021   Lab Results  Component Value Date   CREATININE 1.09 07/25/2021   BUN 20 07/25/2021   NA 142 07/25/2021   K 4.9 07/25/2021   CL 104 07/25/2021   CO2 29 07/25/2021   Lab Results  Component Value Date   ALT 10 07/25/2021   AST 12 07/25/2021   ALKPHOS 31 (L) 07/25/2021   BILITOT 0.8 07/25/2021   Lab Results  Component Value Date   CHOL 148 07/25/2021   Lab Results  Component Value Date   HDL 42.90 07/25/2021   Lab Results  Component Value Date   LDLCALC 89 07/25/2021   Lab Results  Component Value Date   TRIG 82.0 07/25/2021   Lab Results  Component Value Date   CHOLHDL 3 07/25/2021   Lab Results  Component Value Date   PSA 0.28 07/25/2021   PSA 0.4 05/09/2019   PSA 0.30 05/28/2018   ASSESSMENT AND PLAN:   #1 health maintenance exam: Reviewed age and gender appropriate health maintenance issues (prudent diet, regular exercise, health risks of tobacco and excessive alcohol, use of seatbelts, fire alarms in home, use of sunscreen).  Also reviewed age and gender appropriate health screening as well as vaccine recommendations. Vaccines: Tdap->given today.   Labs: fasting HP, Hba1c, PSA. Prostate ca screening:  PSA today. Colon ca screening:  2007 normal TCS in [place], [place].  Due for recall->ordered GI referral today.  2 hypertension,  BP appear today but has not taken his medications yet. He will try to get in the habit of checking at home and/or work. In the meantime we will continue amlodipine 10 mg a day, HCTZ 25 mg a day, and losartan 100 mg a day as well as Toprol-XL 100 mg a day. Electrolytes and creatinine today.  3.  Diabetes, doing well on metformin 1000 mg twice daily. Hemoglobin A1c today. Urine microalbumin/creatinine today. Feet exam normal today.  4.  Hypercholesterolemia. He is on a atorvastatin 80 mg a day. Zetia 10 mg in the past but he has not been taking this lately. Lipid panel and hepatic panel today.  An After Visit Summary was printed and given to the patient.  FOLLOW UP:  Return in about 6 months (around 07/09/2023) for routine chronic illness f/u.  Signed:  Crissie Sickles, MD           01/08/2023

## 2023-01-22 ENCOUNTER — Telehealth: Payer: Self-pay | Admitting: Family Medicine

## 2023-01-22 NOTE — Telephone Encounter (Signed)
Pt called with additional questions and concerns about Cholesterol. He would like a CMA to specifically go over his numbers with him. Please give the patient a call regarding this matter.

## 2023-01-23 NOTE — Telephone Encounter (Signed)
Spoke with pt regarding labs and instructions.   

## 2023-02-01 ENCOUNTER — Ambulatory Visit: Payer: BLUE CROSS/BLUE SHIELD | Admitting: Family Medicine

## 2023-02-01 ENCOUNTER — Encounter: Payer: Self-pay | Admitting: Family Medicine

## 2023-02-01 VITALS — BP 116/75 | HR 61 | Ht 67.0 in | Wt 201.8 lb

## 2023-02-01 DIAGNOSIS — R051 Acute cough: Secondary | ICD-10-CM

## 2023-02-01 DIAGNOSIS — J209 Acute bronchitis, unspecified: Secondary | ICD-10-CM | POA: Diagnosis not present

## 2023-02-01 DIAGNOSIS — J069 Acute upper respiratory infection, unspecified: Secondary | ICD-10-CM

## 2023-02-01 LAB — POCT INFLUENZA A/B
Influenza A, POC: NEGATIVE
Influenza B, POC: NEGATIVE

## 2023-02-01 LAB — POC COVID19 BINAXNOW: SARS Coronavirus 2 Ag: NEGATIVE

## 2023-02-01 MED ORDER — HYDROCODONE BIT-HOMATROP MBR 5-1.5 MG/5ML PO SOLN: ORAL | 0 refills | Status: DC

## 2023-02-01 MED ORDER — PREDNISONE 20 MG PO TABS: ORAL_TABLET | ORAL | 0 refills | Status: DC

## 2023-02-01 NOTE — [facility] Notes (Signed)
OFFICE VISIT  02/01/2023  CC:  Chief Complaint  Patient presents with   Headache    Taking Tylenol; has been running low grade fever.    Cough    X4 days; Productive w/ light -bright yellow phlegm; pt also c/o bilat ear pain, right side neck pain, mild sternum pain with cough; postnasal drip; fatigue;no otc meds used.     Patient is a 62 y.o. male who presents for sinus congestion  HPI: 4-day history of nasal congestion, postnasal drip, headache, fatigue, and cough.  He hears some wheezing on inspiration, chest feels tight.  No shortness of breath, no chest pain.  Low-grade fever.  No pain in the face or upper teeth.  He ears do hurt some.  Past Medical History:  Diagnosis Date   Coronary artery disease    DES to RCA 2010 in setting of acute event; repeat cath 03/02/10 for chest pain showed widely patent stent and normal LV function.   COVID-19 virus infection 05/04/2021   DDD (degenerative disc disease), lumbar    Diabetes mellitus without complication ([place]) [id number]   Diverticular disease    h/o 'itis   Fatty liver    Genital warts 06/23/2013   History of pancreatitis    History of peptic ulcer disease    Hyperlipidemia    Atorva 80. Recommended adding zetia 11/2019   Hypertension    Mood disorder ([place])    per old records; anger and irritability + elevated libido--improved on sertraline.   Renal artery stenosis ([place]) 2011   Discordant data (mild bilat ostial disease on distal aortogram at time of cath, renal arteries appeared normal.  Then renal doppler u/s showed R>L RAS, renal sizes similar.  Recheck of renal artery dopplers 06/2013 showed no significant changes--repeat in 12 months recommended.   Right flank pain    apparently had a trigger point b/c once the orthopedist injected the region he had no further pain.  (extensive w/u done for this problem prior to the injection, though).    Past Surgical History:  Procedure Laterality Date   COLONOSCOPY  2007   Normal per pt.   Recall 2017 (done in [place], [place]).   CORONARY STENT PLACEMENT  09/14/09   RCA (Dr. Rex Kras)   [place]  2007   Rt knee arthroscopy   KNEE SURGERY  1983   Bilat knee debridement   PALATE SURGERY  age 49 mo   TYMPANOSTOMY TUBE PLACEMENT  1967    Outpatient Medications Prior to Visit  Medication Sig Dispense Refill   amLODipine (NORVASC) 10 MG tablet Take 1 tablet (10 mg total) by mouth daily. 90 tablet 1   aspirin 325 MG tablet Take 325 mg by mouth daily.     atorvastatin (LIPITOR) 80 MG tablet Take 1 tablet (80 mg total) by mouth daily. 90 tablet 1   cetirizine (ZYRTEC) 10 MG tablet Take 1 tablet (10 mg total) by mouth daily. 30 tablet 11   ezetimibe (ZETIA) 10 MG tablet Take 1 tablet (10 mg total) by mouth daily. 90 tablet 1   fluticasone (FLONASE) 50 MCG/ACT nasal spray Place 2 sprays into both nostrils daily. 16 g 6   hydrochlorothiazide (HYDRODIURIL) 25 MG tablet Take 1 tablet (25 mg total) by mouth daily. 90 tablet 1   ipratropium (ATROVENT) 0.06 % nasal spray Place 2 sprays into both nostrils 4 (four) times daily. 15 mL 12   losartan (COZAAR) 100 MG tablet Take 1 tablet (100 mg total) by mouth daily. 90 tablet  1   metFORMIN (GLUCOPHAGE) 1000 MG tablet Take 1 tablet (1,000 mg total) by mouth 2 (two) times daily with a meal. 180 tablet 1   metoprolol succinate (TOPROL-XL) 100 MG 24 hr tablet Take with or immediately following a meal. 90 tablet 1   nitroGLYCERIN (NITROSTAT) 0.4 MG SL tablet Place 1 tablet (0.4 mg total) under the tongue every 5 (five) minutes as needed. 30 tablet 11   albuterol (VENTOLIN HFA) 108 (90 Base) MCG/ACT inhaler Inhale 2 puffs into the lungs every 6 (six) hours as needed for wheezing or shortness of breath. (Patient not taking: Reported on 01/08/2023) 1 each 0   No facility-administered medications prior to visit.    Allergies  Allergen Reactions   Orphenadrine Rash   Tramadol     Per pt: legs kick, trouble coming off   Keflex [Cephalexin] Rash    Penicillins Rash and Other (See Comments)    Angio-edema    Review of Systems  As per HPI  PE:    02/01/2023    2:48 PM 01/08/2023    9:09 AM 01/08/2023    8:43 AM  Vitals with BMI  Height 5' 7"$   5' 7"$   Weight 201 lbs 13 oz  202 lbs 3 oz  BMI [id number]  [id number]  Systolic [id number]  Diastolic 75 72 80  Pulse 61  72     Physical Exam  VS: noted--normal. Gen: alert, NAD, NONTOXIC APPEARING. HEENT: eyes without injection, drainage, or swelling.  Ears: EACs clear, TMs with normal light reflex and landmarks.  Nose: Clear rhinorrhea, with some dried, crusty exudate adherent to mildly injected mucosa.  No purulent d/c.  No paranasal sinus TTP.  No facial swelling.  Throat and mouth without focal lesion.  No pharyngial swelling, erythema, or exudate.   Neck: supple, no LAD.   LUNGS: CTA bilat, nonlabored resps.  He coughs very easily with forced exhalation. CV: RRR, no m/r/g. EXT: no c/c/e SKIN: no rash   LABS:  Last CBC Lab Results  Component Value Date   WBC 6.5 07/25/2021   HGB 13.9 07/25/2021   HCT 42.0 07/25/2021   MCV 86.4 07/25/2021   MCH 28.9 08/19/2012   RDW 13.4 07/25/2021   PLT 190.0 [id number]   Last metabolic panel Lab Results  Component Value Date   GLUCOSE 319 (H) 01/08/2023   NA 137 01/08/2023   K 4.4 01/08/2023   CL 97 01/08/2023   CO2 31 01/08/2023   BUN 18 01/08/2023   CREATININE 1.08 01/08/2023   CALCIUM 10.4 01/08/2023   PROT 7.6 01/08/2023   ALBUMIN 4.6 01/08/2023   BILITOT 1.4 (H) 01/08/2023   ALKPHOS 57 01/08/2023   AST 14 01/08/2023   ALT 19 01/08/2023   Last hemoglobin A1c Lab Results  Component Value Date   HGBA1C 10.4 (H) 01/08/2023   Lab Results  Component Value Date   TSH 1.87 01/08/2023   Lab Results  Component Value Date   CHOL 127 01/08/2023   HDL 40.80 01/08/2023   LDLCALC 52 01/08/2023   LDLDIRECT 129.0 04/18/2021   TRIG 168.0 (H) 01/08/2023   CHOLHDL 3 01/08/2023   IMPRESSION AND PLAN:  URI with bronchitis. Flu  and covid NEG today. Prednisone 40 mg a day x 5 days. He has an albuterol inhaler to use 2 puffs every 4 hours as needed. Hycodan syrup, 1 to 2 teaspoons twice daily as needed, 120 mL.  An After Visit Summary was printed and given to the patient.  FOLLOW UP: Return if symptoms worsen or fail to improve.  Signed:  Crissie Sickles, MD           02/01/2023

## 2023-02-13 ENCOUNTER — Telehealth: Payer: Self-pay

## 2023-02-13 NOTE — Telephone Encounter (Signed)
Tried calling patient regarding colonoscopy, referral was placed on 1/29. GI left VM to have him scheduled, unable to LVM. If pt returns call, please advise him to call [phone] to schedule with GI.

## 2023-07-06 NOTE — Patient Instructions (Incomplete)
   It was very nice to see you today!   Please call [facility] to be scheduled for your next colonoscopy at [phone].  PLEASE NOTE:   If you had any lab tests please let us know if you have not heard back within a few days. You may see your results on MyChart before we have a chance to review them but we will give you a call once they are reviewed by [place]. If we ordered any referrals today, please let us know if you have not heard from their office within the next 2 weeks. You should receive a letter via MyChart confirming if the referral was approved and their office contact information to schedule.  Please try these tips to maintain a healthy lifestyle:  Eat most of your calories during the day when you are active. Eliminate processed foods including packaged sweets (pies, cakes, cookies), reduce intake of potatoes, white bread, white pasta, and white rice. Look for whole grain options, oat flour or almond flour.  Each meal should contain half fruits/vegetables, one quarter protein, and one quarter carbs (no bigger than a computer mouse).  Cut down on sweet beverages. This includes juice, soda, and sweet tea. Also watch fruit intake, though this is a healthier sweet option, it still contains natural sugar! Limit to 3 servings daily.  Drink at least 1 glass of water with each meal and aim for at least 8 glasses per day  Exercise at least 150 minutes every week.

## 2023-07-09 ENCOUNTER — Other Ambulatory Visit: Payer: BLUE CROSS/BLUE SHIELD

## 2023-07-09 ENCOUNTER — Encounter: Payer: Self-pay | Admitting: Family Medicine

## 2023-07-09 ENCOUNTER — Ambulatory Visit: Payer: BLUE CROSS/BLUE SHIELD | Admitting: Family Medicine

## 2023-07-09 VITALS — BP 108/76 | HR 77 | Wt 181.0 lb

## 2023-07-09 DIAGNOSIS — K909 Intestinal malabsorption, unspecified: Secondary | ICD-10-CM

## 2023-07-09 DIAGNOSIS — E7800 Pure hypercholesterolemia, unspecified: Secondary | ICD-10-CM

## 2023-07-09 DIAGNOSIS — E119 Type 2 diabetes mellitus without complications: Secondary | ICD-10-CM

## 2023-07-09 DIAGNOSIS — I10 Essential (primary) hypertension: Secondary | ICD-10-CM | POA: Diagnosis not present

## 2023-07-09 DIAGNOSIS — Z794 Long term (current) use of insulin: Secondary | ICD-10-CM | POA: Diagnosis not present

## 2023-07-09 DIAGNOSIS — Z1211 Encounter for screening for malignant neoplasm of colon: Secondary | ICD-10-CM

## 2023-07-09 LAB — GLUCOSE, POCT (MANUAL RESULT ENTRY): POC Glucose: 427 mg/dl — AB (ref 70–99)

## 2023-07-09 LAB — COMPREHENSIVE METABOLIC PANEL
ALT: 17 U/L (ref 0–53)
AST: 11 U/L (ref 0–37)
Albumin: 4.5 g/dL (ref 3.5–5.2)
Alkaline Phosphatase: 59 U/L (ref 39–117)
BUN: 21 mg/dL (ref 6–23)
CO2: 28 mEq/L (ref 19–32)
Calcium: 10.7 mg/dL — ABNORMAL HIGH (ref 8.4–10.5)
Chloride: 88 mEq/L — ABNORMAL LOW (ref 96–112)
Creatinine, Ser: 1.3 mg/dL (ref 0.40–1.50)
GFR: 58.14 mL/min — ABNORMAL LOW (ref >60.00)
Glucose, Bld: 535 mg/dL (ref 70–99)
Potassium: 3.9 mEq/L (ref 3.5–5.1)
Sodium: 131 mEq/L — ABNORMAL LOW (ref 135–145)
Total Bilirubin: 2.3 mg/dL — ABNORMAL HIGH (ref 0.2–1.2)
Total Protein: 7.7 g/dL (ref 6.0–8.3)

## 2023-07-09 LAB — LDL CHOLESTEROL, DIRECT: Direct LDL: 120 mg/dL

## 2023-07-09 LAB — LIPID PANEL
Cholesterol: 272 mg/dL — ABNORMAL HIGH (ref 0–200)
HDL: 37.2 mg/dL — ABNORMAL LOW (ref >39.00)
Total CHOL/HDL Ratio: 7
Triglycerides: 585 mg/dL — ABNORMAL HIGH (ref 0.0–149.0)

## 2023-07-09 LAB — VITAMIN B12: Vitamin B-12: 420 pg/mL (ref 211–911)

## 2023-07-09 MED ORDER — INSULIN LISPRO (1 UNIT DIAL) 100 UNIT/ML (KWIKPEN): PEN_INJECTOR | SUBCUTANEOUS | 1 refills | Status: AC

## 2023-07-09 MED ORDER — LOSARTAN POTASSIUM 100 MG PO TABS: 100.0000 mg | ORAL_TABLET | Freq: Every day | ORAL | 1 refills | Status: DC

## 2023-07-09 MED ORDER — AMLODIPINE BESYLATE 10 MG PO TABS: 10.0000 mg | ORAL_TABLET | Freq: Every day | ORAL | 1 refills | Status: DC

## 2023-07-09 MED ORDER — LANTUS SOLOSTAR 100 UNIT/ML ~~LOC~~ SOPN: PEN_INJECTOR | SUBCUTANEOUS | 1 refills | Status: DC

## 2023-07-09 MED ORDER — METOPROLOL SUCCINATE ER 100 MG PO TB24: ORAL_TABLET | ORAL | 1 refills | Status: DC

## 2023-07-09 MED ORDER — NYSTATIN 100000 UNIT/ML MT SUSP: 5.0000 mL | Freq: Four times a day (QID) | OROMUCOSAL | 0 refills | Status: DC

## 2023-07-09 MED ORDER — HYDROCHLOROTHIAZIDE 25 MG PO TABS: 25.0000 mg | ORAL_TABLET | Freq: Every day | ORAL | 1 refills | Status: DC

## 2023-07-09 MED ORDER — ATORVASTATIN CALCIUM 80 MG PO TABS: 80.0000 mg | ORAL_TABLET | Freq: Every day | ORAL | 1 refills | Status: DC

## 2023-07-09 MED ORDER — EZETIMIBE 10 MG PO TABS: 10.0000 mg | ORAL_TABLET | Freq: Every day | ORAL | 1 refills | Status: DC

## 2023-07-09 NOTE — Progress Notes (Signed)
OFFICE VISIT  07/09/2023  CC:  Chief Complaint  Patient presents with   Medical Management of Chronic Issues    Pt is concerned about how much weight he has lost recently. Pt denies changing diet/activity levels. Pt states he also feels his diabetes is not under control. I told him we would be checking his A1C today and pt requested glucose as well.     Patient is a 62 y.o. male who presents for 69-month follow-up diabetes, hypertension, and hyperlipidemia.  INTERIM HX: Eric Hunter is not feeling too well lately.  He is grieving for the death of his nephew who lives with him, happened a few months ago. He feels like he has not been taking care of himself very well, has been very stressed, has noted glucoses to be in the 300s a lot of the time. He feels an uncomfortable sensation in his mouth, feels like he has thrush. Intermittently feels blurry vision.  Positive for polyuria and polydipsia. Some morning nausea is present. He continues to work full-time.  He reports compliance with all of his medications.  ROS as above, plus--> no fevers, no CP, no SOB, no wheezing, no cough, no dizziness, no HAs, no rashes, no melena/hematochezia.  No myalgias or arthralgias.  No focal weakness, paresthesias, or tremors.  No acute vision or hearing abnormalities.  No dysuria . No vomiting, diarrhea ,or abd pain.  No palpitations.    Past Medical History:  Diagnosis Date   Coronary artery disease    DES to RCA 2010 in setting of acute event; repeat cath 03/02/10 for chest pain showed widely patent stent and normal LV function.   COVID-19 virus infection 05/04/2021   DDD (degenerative disc disease), lumbar    Diabetes mellitus without complication (HCC) 2019   Diverticular disease    h/o 'itis   Fatty liver    Genital warts 06/23/2013   History of pancreatitis    History of peptic ulcer disease    Hyperlipidemia    Atorva 80. Recommended adding zetia 11/2019   Hypertension    Mood disorder (HCC)    per  old records; anger and irritability + elevated libido--improved on sertraline.   Renal artery stenosis (HCC) 2011   Discordant data (mild bilat ostial disease on distal aortogram at time of cath, renal arteries appeared normal.  Then renal doppler u/s showed R>L RAS, renal sizes similar.  Recheck of renal artery dopplers 06/2013 showed no significant changes--repeat in 12 months recommended.   Right flank pain    apparently had a trigger point b/c once the orthopedist injected the region he had no further pain.  (extensive w/u done for this problem prior to the injection, though).    Past Surgical History:  Procedure Laterality Date   COLONOSCOPY  2007   Normal per pt.  Recall 2017 (done in [place], [place]).   CORONARY STENT PLACEMENT  09/14/09   RCA (Dr. Clarene Duke)   KNEE SURGERY  2007   Rt knee arthroscopy   KNEE SURGERY  1983   Bilat knee debridement   PALATE SURGERY  age 40 mo   TYMPANOSTOMY TUBE PLACEMENT  1967    Outpatient Medications Prior to Visit  Medication Sig Dispense Refill   aspirin 325 MG tablet Take 325 mg by mouth daily.     cetirizine (ZYRTEC) 10 MG tablet Take 1 tablet (10 mg total) by mouth daily. 30 tablet 11   fluticasone (FLONASE) 50 MCG/ACT nasal spray Place 2 sprays into both nostrils daily. 16 g  6   nitroGLYCERIN (NITROSTAT) 0.4 MG SL tablet Place 1 tablet (0.4 mg total) under the tongue every 5 (five) minutes as needed. 30 tablet 11   metFORMIN (GLUCOPHAGE) 1000 MG tablet Take 1 tablet (1,000 mg total) by mouth 2 (two) times daily with a meal. 180 tablet 1   albuterol (VENTOLIN HFA) 108 (90 Base) MCG/ACT inhaler Inhale 2 puffs into the lungs every 6 (six) hours as needed for wheezing or shortness of breath. (Patient not taking: Reported on 01/08/2023) 1 each 0   ipratropium (ATROVENT) 0.06 % nasal spray Place 2 sprays into both nostrils 4 (four) times daily. (Patient not taking: Reported on 07/09/2023) 15 mL 12   amLODipine (NORVASC) 10 MG tablet Take 1 tablet (10 mg  total) by mouth daily. 90 tablet 1   atorvastatin (LIPITOR) 80 MG tablet Take 1 tablet (80 mg total) by mouth daily. 90 tablet 1   ezetimibe (ZETIA) 10 MG tablet Take 1 tablet (10 mg total) by mouth daily. (Patient not taking: Reported on 07/09/2023) 90 tablet 1   hydrochlorothiazide (HYDRODIURIL) 25 MG tablet Take 1 tablet (25 mg total) by mouth daily. 90 tablet 1   HYDROcodone bit-homatropine (HYCODAN) 5-1.5 MG/5ML syrup 1-2 tsp po bid prn cough 120 mL 0   losartan (COZAAR) 100 MG tablet Take 1 tablet (100 mg total) by mouth daily. 90 tablet 1   metoprolol succinate (TOPROL-XL) 100 MG 24 hr tablet Take with or immediately following a meal. 90 tablet 1   predniSONE (DELTASONE) 20 MG tablet 2 tabs po qd x 5d 10 tablet 0   No facility-administered medications prior to visit.    Allergies  Allergen Reactions   Orphenadrine Rash   Tramadol     Per pt: legs kick, trouble coming off   Keflex [Cephalexin] Rash   Penicillins Rash and Other (See Comments)    Angio-edema    Review of Systems As per HPI  PE:    07/09/2023    9:01 AM 02/01/2023    2:48 PM 01/08/2023    9:09 AM  Vitals with BMI  Height  5\' 7"    Weight 181 lbs 201 lbs 13 oz   BMI  31.6   Systolic 108 116 440  Diastolic 76 75 72  Pulse 77 61      Physical Exam  Gen: Alert, well appearing.  Patient is oriented to person, place, time, and situation. AFFECT: pleasant, lucid thought and speech. Mouth: Tongue with subtle white plaque, subtle white plaque on buccal mucosa.  LABS:  Last CBC Lab Results  Component Value Date   WBC 6.5 07/25/2021   HGB 13.9 07/25/2021   HCT 42.0 07/25/2021   MCV 86.4 07/25/2021   MCH 28.9 08/19/2012   RDW 13.4 07/25/2021   PLT 190.0 07/25/2021   Last metabolic panel Lab Results  Component Value Date   GLUCOSE 319 (H) 01/08/2023   NA 137 01/08/2023   K 4.4 01/08/2023   CL 97 01/08/2023   CO2 31 01/08/2023   BUN 18 01/08/2023   CREATININE 1.08 01/08/2023   GFR 72.88 01/08/2023    CALCIUM 10.4 01/08/2023   PROT 7.6 01/08/2023   ALBUMIN 4.6 01/08/2023   BILITOT 1.4 (H) 01/08/2023   ALKPHOS 57 01/08/2023   AST 14 01/08/2023   ALT 19 01/08/2023   Last lipids Lab Results  Component Value Date   CHOL 127 01/08/2023   HDL 40.80 01/08/2023   LDLCALC 52 01/08/2023   LDLDIRECT 129.0 04/18/2021   TRIG 168.0 (  H) 01/08/2023   CHOLHDL 3 01/08/2023   Last hemoglobin A1c Lab Results  Component Value Date   HGBA1C 10.4 (H) 01/08/2023   Last thyroid functions Lab Results  Component Value Date   TSH 1.87 01/08/2023   IMPRESSION AND PLAN:  #1 type 2 diabetes without complication, poor control. Gluc 427 here in the office today. Unable to get point-of-care A1c today so I have added this lab to his venous sample. Discontinue metformin. Start insulin. Instructions:  Start 10 units of Lantus daily. Increase dose by 1 unit every day until your fasting sugar is in the 100-110 range. Start Humalog 5 units with each meal. Add 1 unit to this dose if Premeal glucose is 151-200.  Add 2 units if Premeal glucose is greater than 200.  #2 hypertension, well-controlled on amlodipine 10 mg a day, HCTZ 25 mg a day, losartan 100 mg a day, and Toprol-XL 100 mg a day. Electrolytes and creatinine today.  3.  Hypercholesterolemia, doing well on a atorvastatin 80 mg a day and Zetia 10 mg a day. Lipid panel and hepatic panel today.  #4 grieving.  He seems to be getting through this appropriately but apathy is the biggest thing that remains.  He wants to just continue to work through this without any medication.  An After Visit Summary was printed and given to the patient.  FOLLOW UP: Return in about 2 weeks (around 07/23/2023) for f/u DM.  Signed:  Santiago Bumpers, MD           07/09/2023

## 2023-07-10 ENCOUNTER — Telehealth: Payer: Self-pay

## 2023-07-10 NOTE — Telephone Encounter (Signed)
FYI. Please see below.  [facility] Night - Client TELEPHONE ADVICE RECORD [facility] Patient Name First: Eric Last: Hunter Gender: Male DOB: 1961/08/29 Age: 62 Y 3 M 23 D Return Phone Number: Address: City/State/Zip:[place] [facility] Night - Client Client Site [facility] Night Provider Santiago Bumpers - MD Contact Type Call Who Is Calling Lab / Radiology Lab Name [facility] Lab Phone Number [phone] Lab Tech Name [facility] Lab Reference Number n/a Chief Complaint Lab Result (Critical or Stat) Call Type Lab Send to RN Reason for Call Report lab results Initial Comment Caller states is calling from [facility] with critical results. Lab closes in 13 minutes. Translation No Nurse Assessment Nurse: Danie Chandler, RN, Morgyn Date/Time (Eastern Time): 07/09/2023 5:19:49 PM Is there an on-call provider listed? ---Yes Please list name of person reporting value (Lab Employee) and a contact number. ---[phone] [id number] Please document the following items: Lab name Lab value (read back to lab to verify) Reference range for lab value Date and time blood was drawn Collect time of birth for bilirubin results ---Glucose level 535 ref range 50-499 drawn 07/09/23 at 932 Please collect the patient contact information from the lab. (name, phone number and address) ---[phone] List any special notes provided by lab. ---prior results 5/9 343 8/5 104 1/29 319 Disp. Time Lamount Cohen Time) Disposition Final User 07/09/2023 5:24:59 PM Paged On Call back to [facility] [place], [place], [place] 07/09/2023 5:32:19 PM Called On-Call Provider [place], RN, Morgyn 07/09/2023 5:35:06 PM Lab Call Danie Chandler, RN, Morgyn Reason: critical lab result 07/09/2023 5:35:11 PM Clinical Call Yes Danie Chandler, RN, Morgyn Final Disposition 07/09/2023 5:35:11 PM Clinical Call Yes Danie Chandler, RN, Morgyn PLEASE NOTE: All timestamps contained within this  report are represented as [place] Standard Time. CONFIDENTIALTY NOTICE: This fax transmission is intended only for the addressee. It contains information that is legally privileged, confidential or otherwise protected from use or disclosure. If you are not the intended recipient, you are strictly prohibited from reviewing, disclosing, copying using or disseminating any of this information or taking any action in reliance on or regarding this information. If you have received this fax in error, please notify us immediately by telephone so that we can arrange for its return to [place]. Phone: [phone], Toll-Free: [phone], Fax: [phone] Page: 2 of 2 Call Id: [id number] Comments User: Harrold Donath, RN Date/Time Lamount Cohen Time): 07/09/2023 5:23:36 PM per client directives called patient for triage - number given by the lab is a non working number. Paging [facility] Phone DateTime Result/ Outcome Message Type Notes Felix Pacini [phone] 07/09/2023 5:24:59 PM Called On Call Provider - Left Message Doctor Paged Joya Martyr [phone] 07/09/2023 5:32:19 PM Called On Call Provider - Reached Doctor Paged Joya Martyr 07/09/2023 5:34:48 PM Spoke with On Call - General Message Result reported critical result to ocp as we could not follow directives and triage patient - only number for patient given by lab is a non working number

## 2023-07-10 NOTE — Progress Notes (Signed)
Pt given results/recommendations.

## 2023-07-10 NOTE — Telephone Encounter (Signed)
Noted. Pt will be contacted.

## 2023-07-23 ENCOUNTER — Ambulatory Visit: Payer: BLUE CROSS/BLUE SHIELD | Admitting: Family Medicine

## 2023-07-23 NOTE — Progress Notes (Deleted)
OFFICE VISIT  07/23/2023  CC: No chief complaint on file.   Patient is a 62 y.o. male who presents for 2-week follow-up poorly controlled diabetes. A/P as of last visit: "1 type 2 diabetes without complication, poor control. Gluc 427 here in the office today. Unable to get point-of-care A1c today so I have added this lab to his venous sample. Discontinue metformin. Start insulin. Instructions:  Start 10 units of Lantus daily. Increase dose by 1 unit every day until your fasting sugar is in the 100-110 range. Start Humalog 5 units with each meal. Add 1 unit to this dose if Premeal glucose is 151-200.  Add 2 units if Premeal glucose is greater than 200.   #2 hypertension, well-controlled on amlodipine 10 mg a day, HCTZ 25 mg a day, losartan 100 mg a day, and Toprol-XL 100 mg a day. Electrolytes and creatinine today.   3.  Hypercholesterolemia, doing well on a atorvastatin 80 mg a day and Zetia 10 mg a day. Lipid panel and hepatic panel today.   #4 grieving.  He seems to be getting through this appropriately but apathy is the biggest thing that remains.  He wants to just continue to work through this without any medication."  INTERIM HX: ***    Past Medical History:  Diagnosis Date   Coronary artery disease    DES to RCA 2010 in setting of acute event; repeat cath 03/02/10 for chest pain showed widely patent stent and normal LV function.   COVID-19 virus infection 05/04/2021   DDD (degenerative disc disease), lumbar    Diabetes mellitus without complication (HCC) 2019   Diverticular disease    h/o 'itis   Fatty liver    Genital warts 06/23/2013   History of pancreatitis    History of peptic ulcer disease    Hyperlipidemia    Atorva 80. Recommended adding zetia 11/2019   Hypertension    Mood disorder (HCC)    per old records; anger and irritability + elevated libido--improved on sertraline.   Renal artery stenosis (HCC) 2011   Discordant data (mild bilat ostial disease on  distal aortogram at time of cath, renal arteries appeared normal.  Then renal doppler u/s showed R>L RAS, renal sizes similar.  Recheck of renal artery dopplers 06/2013 showed no significant changes--repeat in 12 months recommended.   Right flank pain    apparently had a trigger point b/c once the orthopedist injected the region he had no further pain.  (extensive w/u done for this problem prior to the injection, though).    Past Surgical History:  Procedure Laterality Date   COLONOSCOPY  2007   Normal per pt.  Recall 2017 (done in [place], [place]).   CORONARY STENT PLACEMENT  09/14/09   RCA (Dr. Clarene Duke)   KNEE SURGERY  2007   Rt knee arthroscopy   KNEE SURGERY  1983   Bilat knee debridement   PALATE SURGERY  age 40 mo   TYMPANOSTOMY TUBE PLACEMENT  1967    Outpatient Medications Prior to Visit  Medication Sig Dispense Refill   albuterol (VENTOLIN HFA) 108 (90 Base) MCG/ACT inhaler Inhale 2 puffs into the lungs every 6 (six) hours as needed for wheezing or shortness of breath. (Patient not taking: Reported on 01/08/2023) 1 each 0   amLODipine (NORVASC) 10 MG tablet Take 1 tablet (10 mg total) by mouth daily. 90 tablet 1   aspirin 325 MG tablet Take 325 mg by mouth daily.     atorvastatin (LIPITOR) 80 MG  tablet Take 1 tablet (80 mg total) by mouth daily. 90 tablet 1   cetirizine (ZYRTEC) 10 MG tablet Take 1 tablet (10 mg total) by mouth daily. 30 tablet 11   ezetimibe (ZETIA) 10 MG tablet Take 1 tablet (10 mg total) by mouth daily. 90 tablet 1   fluticasone (FLONASE) 50 MCG/ACT nasal spray Place 2 sprays into both nostrils daily. 16 g 6   hydrochlorothiazide (HYDRODIURIL) 25 MG tablet Take 1 tablet (25 mg total) by mouth daily. 90 tablet 1   insulin glargine (LANTUS SOLOSTAR) 100 UNIT/ML Solostar Pen 10 units SQ qd 15 mL 1   insulin lispro (HUMALOG KWIKPEN) 100 UNIT/ML KwikPen 10 units SQ qAC 15 mL 1   ipratropium (ATROVENT) 0.06 % nasal spray Place 2 sprays into both nostrils 4 (four) times  daily. (Patient not taking: Reported on 07/09/2023) 15 mL 12   losartan (COZAAR) 100 MG tablet Take 1 tablet (100 mg total) by mouth daily. 90 tablet 1   metoprolol succinate (TOPROL-XL) 100 MG 24 hr tablet Take with or immediately following a meal. 90 tablet 1   nitroGLYCERIN (NITROSTAT) 0.4 MG SL tablet Place 1 tablet (0.4 mg total) under the tongue every 5 (five) minutes as needed. 30 tablet 11   nystatin (MYCOSTATIN) 100000 UNIT/ML suspension Take 5 mLs (500,000 Units total) by mouth 4 (four) times daily. 200 mL 0   No facility-administered medications prior to visit.    Allergies  Allergen Reactions   Orphenadrine Rash   Tramadol     Per pt: legs kick, trouble coming off   Keflex [Cephalexin] Rash   Penicillins Rash and Other (See Comments)    Angio-edema    Review of Systems As per HPI  PE:    07/09/2023    9:01 AM 02/01/2023    2:48 PM 01/08/2023    9:09 AM  Vitals with BMI  Height  5\' 7"    Weight 181 lbs 201 lbs 13 oz   BMI  31.6   Systolic 108 116 657  Diastolic 76 75 72  Pulse 77 61      Physical Exam  ***  LABS:  Last CBC Lab Results  Component Value Date   WBC 6.5 07/25/2021   HGB 13.9 07/25/2021   HCT 42.0 07/25/2021   MCV 86.4 07/25/2021   MCH 28.9 08/19/2012   RDW 13.4 07/25/2021   PLT 190.0 07/25/2021   Last metabolic panel Lab Results  Component Value Date   GLUCOSE 535 (HH) 07/09/2023   NA 131 (L) 07/09/2023   K 3.9 07/09/2023   CL 88 (L) 07/09/2023   CO2 28 07/09/2023   BUN 21 07/09/2023   CREATININE 1.30 07/09/2023   GFR 58.14 (L) 07/09/2023   CALCIUM 10.7 (H) 07/09/2023   PROT 7.7 07/09/2023   ALBUMIN 4.5 07/09/2023   BILITOT 2.3 (H) 07/09/2023   ALKPHOS 59 07/09/2023   AST 11 07/09/2023   ALT 17 07/09/2023   Last lipids Lab Results  Component Value Date   CHOL 272 (H) 07/09/2023   HDL 37.20 (L) 07/09/2023   LDLCALC 52 01/08/2023   LDLDIRECT 120.0 07/09/2023   TRIG (H) 07/09/2023    585.0 Triglyceride is over 400;  calculations on Lipids are invalid.   CHOLHDL 7 07/09/2023   Last hemoglobin A1c Lab Results  Component Value Date   HGBA1C CANCELED 07/09/2023   Last thyroid functions Lab Results  Component Value Date   TSH 1.87 01/08/2023   Last vitamin B12 and Folate Lab Results  Component Value Date   VITAMINB12 420 07/09/2023   IMPRESSION AND PLAN:  No problem-specific Assessment & Plan notes found for this encounter.   An After Visit Summary was printed and given to the patient.  FOLLOW UP: No follow-ups on file.  Signed:  Santiago Bumpers, MD           07/23/2023

## 2023-08-03 ENCOUNTER — Telehealth: Payer: Self-pay

## 2023-08-03 NOTE — Telephone Encounter (Signed)
No I cannot do a work note unless I have seen him for this particular problem. Also, double vision is something he needs to be seen for urgently.  Advise him to go to the emergency department. thx

## 2023-08-03 NOTE — Telephone Encounter (Signed)
Please advise  [facility] Day - Client Nonclinical Telephone Record  [facility] Client [facility] Day - Client Client Site [facility] - Day Provider Santiago Bumpers - MD Contact Type Call Who Is Calling Patient / Member / Family / Caregiver Caller Name Brailyn Topel Caller Phone Number [phone] Patient Name Eric Hunter Patient DOB 1961/06/26 Call Type Message Only Information Provided Reason for Call Request for General Office Information Initial Comment Caller states he has a change in vision and can not see; he is having double vision. He had to take off today and needs a note for work. Disp. Time Disposition Final User 08/03/2023 12:25:11 PM General Information Provided Yes Dotts, Daneen Call Closed By: Jesse Fall Transaction Date/Time: 08/03/2023 12:17:59 PM (ET)

## 2023-08-06 NOTE — Telephone Encounter (Signed)
Patient is aware of Dr. Samul Dada recommendations "No I cannot do a work note unless I have seen him for this particular problem. Also, double vision is something he needs to be seen for urgently.  Advise him to go to the emergency department."  Patient stated " I know I need to go to ED." I want to see Dr. Milinda Cave also. I scheduled an appt for pt on 8/28 with Dr. Milinda Cave.

## 2023-08-08 ENCOUNTER — Ambulatory Visit: Payer: BLUE CROSS/BLUE SHIELD | Admitting: Family Medicine

## 2023-08-08 ENCOUNTER — Telehealth: Payer: Self-pay

## 2023-08-08 ENCOUNTER — Encounter: Payer: Self-pay | Admitting: Family Medicine

## 2023-08-08 VITALS — BP 105/63 | HR 51 | Temp 97.8°F | Wt 196.2 lb

## 2023-08-08 DIAGNOSIS — E119 Type 2 diabetes mellitus without complications: Secondary | ICD-10-CM | POA: Diagnosis not present

## 2023-08-08 DIAGNOSIS — Z794 Long term (current) use of insulin: Secondary | ICD-10-CM | POA: Diagnosis not present

## 2023-08-08 DIAGNOSIS — H532 Diplopia: Secondary | ICD-10-CM

## 2023-08-08 DIAGNOSIS — H547 Unspecified visual loss: Secondary | ICD-10-CM | POA: Diagnosis not present

## 2023-08-08 NOTE — Progress Notes (Signed)
OFFICE VISIT  08/08/2023  CC:  Chief Complaint  Patient presents with   Loss of Vision    Right eye vision change concern of cataract; double vision;8 days    Patient is a 62 y.o. male who presents accompanied by his wife for decreased vision.  HPI: Approximately 8 to 9 days ago he started to notice significant change in his vision, primarily in the right eye. Acuity went way down.  He describes seeing things double.  He sees 2 sets of lines in the middle of the road. Says his left eye feels worse as well but not nearly as bad as the right. No eye pain, no headaches, no eye swelling, no recent sinus infection.  Of note, his diabetes had been very out-of-control up until approximately 1 month ago when he followed up and we started him on insulin.  During the time of severely high sugars in the prior months he does report his vision will blurry but he says this cleared up significantly once his sugars started coming down in the 200s range. He states the current visual abnormality is substantially different.  He reports that a couple of years ago he saw Dr. Allena Katz with [facility].  He said that he was told that he had early stages of a cataract and early stages of glaucoma.  ROS as above, plus--> no fevers, no dizziness,  No myalgias or arthralgias.  No focal weakness, paresthesias, or tremors.  No acute hearing abnormalities.  No speech or swallowing difficulty.   Past Medical History:  Diagnosis Date   Coronary artery disease    DES to RCA 2010 in setting of acute event; repeat cath 03/02/10 for chest pain showed widely patent stent and normal LV function.   COVID-19 virus infection 05/04/2021   DDD (degenerative disc disease), lumbar    Diabetes mellitus without complication (HCC) 2019   Diverticular disease    h/o 'itis   Fatty liver    Genital warts 06/23/2013   History of pancreatitis    History of peptic ulcer disease    Hyperlipidemia    Atorva 80. Recommended  adding zetia 11/2019   Hypertension    Mood disorder (HCC)    per old records; anger and irritability + elevated libido--improved on sertraline.   Renal artery stenosis (HCC) 2011   Discordant data (mild bilat ostial disease on distal aortogram at time of cath, renal arteries appeared normal.  Then renal doppler u/s showed R>L RAS, renal sizes similar.  Recheck of renal artery dopplers 06/2013 showed no significant changes--repeat in 12 months recommended.   Right flank pain    apparently had a trigger point b/c once the orthopedist injected the region he had no further pain.  (extensive w/u done for this problem prior to the injection, though).    Past Surgical History:  Procedure Laterality Date   COLONOSCOPY  2007   Normal per pt.  Recall 2017 (done in [place], [place]).   CORONARY STENT PLACEMENT  09/14/09   RCA (Dr. Clarene Duke)   KNEE SURGERY  2007   Rt knee arthroscopy   KNEE SURGERY  1983   Bilat knee debridement   PALATE SURGERY  age 77 mo   TYMPANOSTOMY TUBE PLACEMENT  1967    Outpatient Medications Prior to Visit  Medication Sig Dispense Refill   amLODipine (NORVASC) 10 MG tablet Take 1 tablet (10 mg total) by mouth daily. 90 tablet 1   aspirin 325 MG tablet Take 325 mg by mouth daily.  atorvastatin (LIPITOR) 80 MG tablet Take 1 tablet (80 mg total) by mouth daily. 90 tablet 1   cetirizine (ZYRTEC) 10 MG tablet Take 1 tablet (10 mg total) by mouth daily. 30 tablet 11   fluticasone (FLONASE) 50 MCG/ACT nasal spray Place 2 sprays into both nostrils daily. 16 g 6   hydrochlorothiazide (HYDRODIURIL) 25 MG tablet Take 1 tablet (25 mg total) by mouth daily. 90 tablet 1   insulin glargine (LANTUS SOLOSTAR) 100 UNIT/ML Solostar Pen 10 units SQ qd 15 mL 1   insulin lispro (HUMALOG KWIKPEN) 100 UNIT/ML KwikPen 10 units SQ qAC 15 mL 1   ipratropium (ATROVENT) 0.06 % nasal spray Place 2 sprays into both nostrils 4 (four) times daily. 15 mL 12   losartan (COZAAR) 100 MG tablet Take 1 tablet  (100 mg total) by mouth daily. 90 tablet 1   metoprolol succinate (TOPROL-XL) 100 MG 24 hr tablet Take with or immediately following a meal. 90 tablet 1   nitroGLYCERIN (NITROSTAT) 0.4 MG SL tablet Place 1 tablet (0.4 mg total) under the tongue every 5 (five) minutes as needed. 30 tablet 11   albuterol (VENTOLIN HFA) 108 (90 Base) MCG/ACT inhaler Inhale 2 puffs into the lungs every 6 (six) hours as needed for wheezing or shortness of breath. (Patient not taking: Reported on 01/08/2023) 1 each 0   ezetimibe (ZETIA) 10 MG tablet Take 1 tablet (10 mg total) by mouth daily. (Patient not taking: Reported on 08/08/2023) 90 tablet 1   nystatin (MYCOSTATIN) 100000 UNIT/ML suspension Take 5 mLs (500,000 Units total) by mouth 4 (four) times daily. (Patient not taking: Reported on 08/08/2023) 200 mL 0   No facility-administered medications prior to visit.    Allergies  Allergen Reactions   Orphenadrine Rash   Tramadol     Per pt: legs kick, trouble coming off   Keflex [Cephalexin] Rash   Penicillins Rash and Other (See Comments)    Angio-edema    Review of Systems  As per HPI  PE:    08/08/2023    3:56 PM 07/09/2023    9:01 AM 02/01/2023    2:48 PM  Vitals with BMI  Height   5\' 7"   Weight 196 lbs 3 oz 181 lbs 201 lbs 13 oz  BMI   31.6  Systolic 105 108 440  Diastolic 63 76 75  Pulse 51 77 61     Physical Exam  Gen: Alert, well appearing.  Patient is oriented to person, place, time, and situation. AFFECT: pleasant, lucid thought and speech. EOMI, PERRL, no bulbar or palpebral injection or swelling.  R eye with obscured view of retina. Visual fields intact.  LABS:  Last CBC Lab Results  Component Value Date   WBC 6.5 07/25/2021   HGB 13.9 07/25/2021   HCT 42.0 07/25/2021   MCV 86.4 07/25/2021   MCH 28.9 08/19/2012   RDW 13.4 07/25/2021   PLT 190.0 07/25/2021   Last metabolic panel Lab Results  Component Value Date   GLUCOSE 535 (HH) 07/09/2023   NA 131 (L) 07/09/2023   K  3.9 07/09/2023   CL 88 (L) 07/09/2023   CO2 28 07/09/2023   BUN 21 07/09/2023   CREATININE 1.30 07/09/2023   GFR 58.14 (L) 07/09/2023   CALCIUM 10.7 (H) 07/09/2023   PROT 7.7 07/09/2023   ALBUMIN 4.5 07/09/2023   BILITOT 2.3 (H) 07/09/2023   ALKPHOS 59 07/09/2023   AST 11 07/09/2023   ALT 17 07/09/2023   Last lipids Lab Results  Component Value Date   CHOL 272 (H) 07/09/2023   HDL 37.20 (L) 07/09/2023   LDLCALC 52 01/08/2023   LDLDIRECT 120.0 07/09/2023   TRIG (H) 07/09/2023    585.0 Triglyceride is over 400; calculations on Lipids are invalid.   CHOLHDL 7 07/09/2023   Last hemoglobin A1c Lab Results  Component Value Date   HGBA1C CANCELED 07/09/2023   Last thyroid functions Lab Results  Component Value Date   TSH 1.87 01/08/2023   IMPRESSION AND PLAN:  #1 acute blurry vision, question of double vision (onset 8 d/a).  He called here on 08/03/2023 requesting a work excuse due to this vision concern and I recommend that he go to the emergency department for evaluation at that time.  He declined. Will attempt to get him urgent ophthalmologist referral.  #2 diabetes, poorly controlled. Improving glucoses since getting on Lantus and Humalog. He is currently on 22 units of Lantus once daily and 14 units of Humalog before every meal. He will continue to increase these insulins to get fasting range of 100-110 and 2-hour postprandial range of 140-170.  An After Visit Summary was printed and given to the patient.  FOLLOW UP: Return in about 2 months (around 10/08/2023) for routine chronic illness f/u.  Signed:  Santiago Bumpers, MD           08/08/2023

## 2023-08-09 NOTE — Telephone Encounter (Signed)
Patient has an appointment with [facility] on Sept 3rd. Patient is aware of all the appointment details.

## 2023-08-10 NOTE — Telephone Encounter (Signed)
Noted! Thank you

## 2023-08-17 ENCOUNTER — Telehealth: Payer: Self-pay | Admitting: Family Medicine

## 2023-08-17 NOTE — Telephone Encounter (Signed)
Forms placed on PCP desk for review first, nothing has been completed.  Please advise on next steps

## 2023-08-17 NOTE — Telephone Encounter (Signed)
Patient dropped off document via Fax, to be filled out by provider. Patient requested to send it back via Fax within ASAP. Document is located in providers tray at front office.Please advise at Mobile [phone] (mobile) FMLA  paperwork faxed placed in [place] in tray upfront.

## 2023-08-24 ENCOUNTER — Telehealth: Payer: Self-pay | Admitting: Family Medicine

## 2023-08-24 DIAGNOSIS — Z0279 Encounter for issue of other medical certificate: Secondary | ICD-10-CM

## 2023-08-24 NOTE — Telephone Encounter (Signed)
Form faxed   Short Term Disability form  , to be filled out by provider. Patient requested to send it back via Fax within ASAP. Document is located in providers tray at front office.Please advise at Mobile [phone] (mobile) Placed in providers in basket up front

## 2023-08-27 NOTE — Telephone Encounter (Addendum)
Please assist patient with scheduling to discuss forms, thanks.

## 2023-08-27 NOTE — Telephone Encounter (Signed)
FYI regarding forms. Please see below.

## 2023-08-27 NOTE — Telephone Encounter (Signed)
I need the patient's help with some specifics for this form: I need the dates that he is unable to work (essentially need the first day he missed work and then an estimate of when he will be able to return). Any follow-up regarding his ophthalmology visit?  Any treatment plan that they have him on?  The form asks for this as well. I will use this info to fill out the form and if I have any further questions we will contact them.  He then needs a follow-up visit set with me in a couple of weeks or so.

## 2023-08-27 NOTE — Telephone Encounter (Signed)
Patient called back to check status of STD paperwork.  I told patient we received paperwork Friday afternoon, it has not been completed and Dr. Milinda Cave has a few questions.  I also reminded him that there is a 5-7 business day turn around for completing any paperwork.  I need the dates that he is unable to work. 8/23 was last day of work  estimate of when he will be able to return. He would like to return to work this week. 9/19  Patient has another form he will be dropping today.  Release to go back to work.   Any follow-up regarding his ophthalmology visit?  Any treatment plan that they have him on?  They prescribed corrective lenses.  Eye surgery is scheduled on 12/18/23.   He then needs a follow-up visit set with me in a couple of weeks or so.  Patient did not schedule at this time.

## 2023-08-27 NOTE — Telephone Encounter (Signed)
Patient scheduled follow up appt with Dr. Milinda Cave on 9/24 at 11:20AM.

## 2023-08-28 ENCOUNTER — Encounter: Payer: Self-pay | Admitting: Family Medicine

## 2023-08-28 NOTE — Telephone Encounter (Signed)
Forms completed. I handed to [facility] and asked her to call patient to get two specific pieces of information from patient--> name of his ophthalmologist and name of the pt's employer. Then formed can completely be filled out and it will be faxed in.

## 2023-09-04 ENCOUNTER — Encounter: Payer: Self-pay | Admitting: Family Medicine

## 2023-09-04 ENCOUNTER — Ambulatory Visit: Payer: BLUE CROSS/BLUE SHIELD | Admitting: Family Medicine

## 2023-09-04 VITALS — BP 120/74 | HR 60 | Temp 98.0°F | Ht 67.0 in | Wt 204.8 lb

## 2023-09-04 DIAGNOSIS — Z23 Encounter for immunization: Secondary | ICD-10-CM

## 2023-09-04 DIAGNOSIS — Z794 Long term (current) use of insulin: Secondary | ICD-10-CM | POA: Diagnosis not present

## 2023-09-04 DIAGNOSIS — H269 Unspecified cataract: Secondary | ICD-10-CM

## 2023-09-04 DIAGNOSIS — E119 Type 2 diabetes mellitus without complications: Secondary | ICD-10-CM | POA: Diagnosis not present

## 2023-09-04 DIAGNOSIS — H538 Other visual disturbances: Secondary | ICD-10-CM

## 2023-09-04 NOTE — Progress Notes (Signed)
OFFICE VISIT  09/04/2023  CC:  Chief Complaint  Patient presents with   Medical Management of Chronic Issues    1 month f/u vision and diabetes; pt got new glasses rx last week and no longer seeing double.     Patient is a 62 y.o. male who presents for 1 month follow-up double vision and diabetes. I last saw him 08/08/23. A/P as of that visit: "#1 acute blurry vision, question of double vision (onset 8 d/a).  He called here on 08/03/2023 requesting a work excuse due to this vision concern and I recommend that he go to the emergency department for evaluation at that time.  He declined. Will attempt to get him urgent ophthalmologist referral.   #2 diabetes, poorly controlled. Improving glucoses since getting on Lantus and Humalog. He is currently on 22 units of Lantus once daily and 14 units of Humalog before every meal. He will continue to increase these insulins to get fasting range of 100-110 and 2-hour postprandial range of 140-170."  INTERIM HX: Eric Hunter feels great. After I saw him he went to the ophthalmologist--> Dr. Deirdre Evener office (Dr. Benjamine Mola) where he was diagnosed with a cataract in the right eye and they made a change in his lenses to correct his vision.  He can drive without problem, see a computer without problem, right without problem, and read without problem.  He has no eye pain and no double vision. He is set to return to work tomorrow.  Past Medical History:  Diagnosis Date   Coronary artery disease    DES to RCA 2010 in setting of acute event; repeat cath 03/02/10 for chest pain showed widely patent stent and normal LV function.   COVID-19 virus infection 05/04/2021   DDD (degenerative disc disease), lumbar    Diabetes mellitus without complication (HCC) 2019   Diplopia    07/2023   Diverticular disease    h/o 'itis   Fatty liver    Genital warts 06/23/2013   History of pancreatitis    History of peptic ulcer disease    Hyperlipidemia    Atorva 80. Recommended  adding zetia 11/2019   Hypertension    Mood disorder (HCC)    per old records; anger and irritability + elevated libido--improved on sertraline.   Renal artery stenosis (HCC) 2011   Discordant data (mild bilat ostial disease on distal aortogram at time of cath, renal arteries appeared normal.  Then renal doppler u/s showed R>L RAS, renal sizes similar.  Recheck of renal artery dopplers 06/2013 showed no significant changes--repeat in 12 months recommended.   Right flank pain    apparently had a trigger point b/c once the orthopedist injected the region he had no further pain.  (extensive w/u done for this problem prior to the injection, though).    Past Surgical History:  Procedure Laterality Date   COLONOSCOPY  2007   Normal per pt.  Recall 2017 (done in [place], [place]).   CORONARY STENT PLACEMENT  09/14/09   RCA (Dr. Clarene Duke)   KNEE SURGERY  2007   Rt knee arthroscopy   KNEE SURGERY  1983   Bilat knee debridement   PALATE SURGERY  age 42 mo   TYMPANOSTOMY TUBE PLACEMENT  1967    Outpatient Medications Prior to Visit  Medication Sig Dispense Refill   amLODipine (NORVASC) 10 MG tablet Take 1 tablet (10 mg total) by mouth daily. 90 tablet 1   aspirin 325 MG tablet Take 325 mg by mouth daily.  atorvastatin (LIPITOR) 80 MG tablet Take 1 tablet (80 mg total) by mouth daily. 90 tablet 1   cetirizine (ZYRTEC) 10 MG tablet Take 1 tablet (10 mg total) by mouth daily. 30 tablet 11   fluticasone (FLONASE) 50 MCG/ACT nasal spray Place 2 sprays into both nostrils daily. 16 g 6   hydrochlorothiazide (HYDRODIURIL) 25 MG tablet Take 1 tablet (25 mg total) by mouth daily. 90 tablet 1   insulin glargine (LANTUS SOLOSTAR) 100 UNIT/ML Solostar Pen 10 units SQ qd (Patient taking differently: 22 units SQ qd) 15 mL 1   insulin lispro (HUMALOG KWIKPEN) 100 UNIT/ML KwikPen 10 units SQ qAC (Patient taking differently: 12 units SQ qAC) 15 mL 1   ipratropium (ATROVENT) 0.06 % nasal spray Place 2 sprays into  both nostrils 4 (four) times daily. 15 mL 12   losartan (COZAAR) 100 MG tablet Take 1 tablet (100 mg total) by mouth daily. 90 tablet 1   metoprolol succinate (TOPROL-XL) 100 MG 24 hr tablet Take with or immediately following a meal. 90 tablet 1   albuterol (VENTOLIN HFA) 108 (90 Base) MCG/ACT inhaler Inhale 2 puffs into the lungs every 6 (six) hours as needed for wheezing or shortness of breath. (Patient not taking: Reported on 01/08/2023) 1 each 0   ezetimibe (ZETIA) 10 MG tablet Take 1 tablet (10 mg total) by mouth daily. (Patient not taking: Reported on 08/08/2023) 90 tablet 1   nitroGLYCERIN (NITROSTAT) 0.4 MG SL tablet Place 1 tablet (0.4 mg total) under the tongue every 5 (five) minutes as needed. (Patient not taking: Reported on 09/04/2023) 30 tablet 11   nystatin (MYCOSTATIN) 100000 UNIT/ML suspension Take 5 mLs (500,000 Units total) by mouth 4 (four) times daily. (Patient not taking: Reported on 08/08/2023) 200 mL 0   No facility-administered medications prior to visit.    Allergies  Allergen Reactions   Orphenadrine Rash   Tramadol     Per pt: legs kick, trouble coming off   Keflex [Cephalexin] Rash   Penicillins Rash and Other (See Comments)    Angio-edema    Review of Systems As per HPI  PE:    09/04/2023   11:14 AM 08/08/2023    3:56 PM 07/09/2023    9:01 AM  Vitals with BMI  Height 5\' 7"     Weight 204 lbs 13 oz 196 lbs 3 oz 181 lbs  BMI 32.07    Systolic 120 105 161  Diastolic 74 63 76  Pulse 60 51 77     Physical Exam  Gen: Alert, well appearing.  Patient is oriented to person, place, time, and situation. AFFECT: pleasant, lucid thought and speech.   LABS:  Last CBC Lab Results  Component Value Date   WBC 6.5 07/25/2021   HGB 13.9 07/25/2021   HCT 42.0 07/25/2021   MCV 86.4 07/25/2021   MCH 28.9 08/19/2012   RDW 13.4 07/25/2021   PLT 190.0 07/25/2021   Last metabolic panel Lab Results  Component Value Date   GLUCOSE 535 (HH) 07/09/2023   NA 131  (L) 07/09/2023   K 3.9 07/09/2023   CL 88 (L) 07/09/2023   CO2 28 07/09/2023   BUN 21 07/09/2023   CREATININE 1.30 07/09/2023   GFR 58.14 (L) 07/09/2023   CALCIUM 10.7 (H) 07/09/2023   PROT 7.7 07/09/2023   ALBUMIN 4.5 07/09/2023   BILITOT 2.3 (H) 07/09/2023   ALKPHOS 59 07/09/2023   AST 11 07/09/2023   ALT 17 07/09/2023   Last hemoglobin A1c Lab Results  Component Value Date   HGBA1C CANCELED 07/09/2023   IMPRESSION AND PLAN:  #1 blurry vision/double vision. This has been corrected by ophthalmology and he has a follow-up ophthalmology appointment for discussion of cataract surgery on 12/18/2023. Completed authorizing return to work tomorrow without restriction.  2.  Diabetes without complication, recent poor control. Since he got on insulin a couple of months ago his control is much much better (gluc avg 150-160). He is currently on 22 units of Lantus daily and 12 units of Humalog with each meal.  He will continue to titrate gradually to a goal of 2-hour postprandial glucose of 140-170 range and fasting glucose in the 100-110 range. Next A1c due approximately 10/12/2023  An After Visit Summary was printed and given to the patient.  FOLLOW UP: Return for 5-6 wks f/u DM.  Signed:  Santiago Bumpers, MD           09/04/2023

## 2023-09-07 ENCOUNTER — Telehealth: Payer: Self-pay

## 2023-09-07 MED ORDER — LANTUS SOLOSTAR 100 UNIT/ML ~~LOC~~ SOPN: PEN_INJECTOR | SUBCUTANEOUS | 1 refills | Status: DC

## 2023-09-07 NOTE — Telephone Encounter (Signed)
An updated rx has been sent to his pharmacy. Tried calling patient, unable to LVM.   Note: if pt returns call, please advise of message above.

## 2023-09-07 NOTE — Telephone Encounter (Signed)
Patient needs new prescription for his insulin glargine (LANTUS SOLOSTAR) 100 UNIT/ML Solostar Pen   He is taking 24 units now.  He has a half of pen left to take. [facility] is stating insurance is denying because of "too soon to fill".  insulin glargine (LANTUS SOLOSTAR) 100 UNIT/ML Solostar Pen   [facility]

## 2023-10-09 ENCOUNTER — Encounter: Payer: Self-pay | Admitting: Family Medicine

## 2023-10-09 ENCOUNTER — Ambulatory Visit: Payer: BLUE CROSS/BLUE SHIELD | Admitting: Family Medicine

## 2023-10-09 VITALS — BP 121/78 | HR 60 | Temp 98.7°F | Wt 207.6 lb

## 2023-10-09 DIAGNOSIS — E7800 Pure hypercholesterolemia, unspecified: Secondary | ICD-10-CM | POA: Diagnosis not present

## 2023-10-09 DIAGNOSIS — Z794 Long term (current) use of insulin: Secondary | ICD-10-CM | POA: Diagnosis not present

## 2023-10-09 DIAGNOSIS — E119 Type 2 diabetes mellitus without complications: Secondary | ICD-10-CM | POA: Diagnosis not present

## 2023-10-09 DIAGNOSIS — I10 Essential (primary) hypertension: Secondary | ICD-10-CM | POA: Diagnosis not present

## 2023-10-09 LAB — LIPID PANEL
Cholesterol: 158 mg/dL (ref 0–200)
HDL: 47.2 mg/dL (ref >39.00)
LDL Cholesterol: 93 mg/dL (ref 0–99)
NonHDL: 110.82
Total CHOL/HDL Ratio: 3
Triglycerides: 87 mg/dL (ref 0.0–149.0)
VLDL: 17.4 mg/dL (ref 0.0–40.0)

## 2023-10-09 LAB — BASIC METABOLIC PANEL
BUN: 17 mg/dL (ref 6–23)
CO2: 30 meq/L (ref 19–32)
Calcium: 9.7 mg/dL (ref 8.4–10.5)
Chloride: 104 meq/L (ref 96–112)
Creatinine, Ser: 1.08 mg/dL (ref 0.40–1.50)
GFR: 72.49 mL/min (ref >60.00)
Glucose, Bld: 191 mg/dL — ABNORMAL HIGH (ref 70–99)
Potassium: 4.2 meq/L (ref 3.5–5.1)
Sodium: 139 meq/L (ref 135–145)

## 2023-10-09 LAB — POCT GLYCOSYLATED HEMOGLOBIN (HGB A1C)
HbA1c POC (<> result, manual entry): 7.6 % (ref 4.0–5.6)
HbA1c, POC (controlled diabetic range): 7.6 % — AB (ref 0.0–7.0)
HbA1c, POC (prediabetic range): 7.6 % — AB (ref 5.7–6.4)
Hemoglobin A1C: 7.6 % — AB (ref 4.0–5.6)

## 2023-10-09 MED ORDER — ATORVASTATIN CALCIUM 80 MG PO TABS: 80.0000 mg | ORAL_TABLET | Freq: Every day | ORAL | 1 refills | Status: DC

## 2023-10-09 MED ORDER — METOPROLOL SUCCINATE ER 100 MG PO TB24: 100.0000 mg | ORAL_TABLET | Freq: Every day | ORAL | 1 refills | Status: DC

## 2023-10-09 MED ORDER — AMLODIPINE BESYLATE 10 MG PO TABS: 10.0000 mg | ORAL_TABLET | Freq: Every day | ORAL | 1 refills | Status: AC

## 2023-10-09 NOTE — Progress Notes (Signed)
OFFICE VISIT  10/09/2023  CC:  Chief Complaint  Patient presents with   Diabetes    Pt is fasting    Patient is a 61 y.o. male who presents for 5-week follow-up diabetes, HTN, and HLD. A/P as of last visit: "  Diabetes without complication, recent poor control. Since he got on insulin a couple of months ago his control is much much better (gluc avg 150-160). He is currently on 22 units of Lantus daily and 12 units of Humalog with each meal.  He will continue to titrate gradually to a goal of 2-hour postprandial glucose of 140-170 range and fasting glucose in the 100-110 range. Next A1c due approximately 10/12/2023"  INTERIM HX: Shawn feels great. Home glucoses normal for the most part. 24 units Lantus daily and Humalog 10 units at each meal.  ROS as above, plus--> no fevers, no CP, no SOB, no wheezing, no cough, no dizziness, no HAs, no rashes, no melena/hematochezia.  No polyuria or polydipsia.  No myalgias or arthralgias.  No focal weakness, paresthesias, or tremors.  No acute vision or hearing abnormalities.  No dysuria or unusual/new urinary urgency or frequency.  No recent changes in lower legs. No n/v/d or abd pain.  No palpitations.     Past Medical History:  Diagnosis Date   Coronary artery disease    DES to RCA 2010 in setting of acute event; repeat cath 03/02/10 for chest pain showed widely patent stent and normal LV function.   COVID-19 virus infection 05/04/2021   DDD (degenerative disc disease), lumbar    Diabetes mellitus without complication (HCC) 2019   Diplopia    07/2023   Diverticular disease    h/o 'itis   Fatty liver    Genital warts 06/23/2013   History of pancreatitis    History of peptic ulcer disease    Hyperlipidemia    Atorva 80. Recommended adding zetia 11/2019   Hypertension    Mood disorder (HCC)    per old records; anger and irritability + elevated libido--improved on sertraline.   Renal artery stenosis (HCC) 2011   Discordant data (mild  bilat ostial disease on distal aortogram at time of cath, renal arteries appeared normal.  Then renal doppler u/s showed R>L RAS, renal sizes similar.  Recheck of renal artery dopplers 06/2013 showed no significant changes--repeat in 12 months recommended.   Right flank pain    apparently had a trigger point b/c once the orthopedist injected the region he had no further pain.  (extensive w/u done for this problem prior to the injection, though).    Past Surgical History:  Procedure Laterality Date   COLONOSCOPY  2007   Normal per pt.  Recall 2017 (done in [place], [place]).   CORONARY STENT PLACEMENT  09/14/09   RCA (Dr. Clarene Duke)   KNEE SURGERY  2007   Rt knee arthroscopy   KNEE SURGERY  1983   Bilat knee debridement   PALATE SURGERY  age 32 mo   TYMPANOSTOMY TUBE PLACEMENT  1967    Outpatient Medications Prior to Visit  Medication Sig Dispense Refill   aspirin 325 MG tablet Take 325 mg by mouth daily.     cetirizine (ZYRTEC) 10 MG tablet Take 1 tablet (10 mg total) by mouth daily. 30 tablet 11   fluticasone (FLONASE) 50 MCG/ACT nasal spray Place 2 sprays into both nostrils daily. 16 g 6   hydrochlorothiazide (HYDRODIURIL) 25 MG tablet Take 1 tablet (25 mg total) by mouth daily. 90 tablet 1  insulin glargine (LANTUS SOLOSTAR) 100 UNIT/ML Solostar Pen 24 units SQ qd 15 mL 1   insulin lispro (HUMALOG KWIKPEN) 100 UNIT/ML KwikPen 10 units SQ qAC (Patient taking differently: 12 units SQ qAC) 15 mL 1   ipratropium (ATROVENT) 0.06 % nasal spray Place 2 sprays into both nostrils 4 (four) times daily. 15 mL 12   losartan (COZAAR) 100 MG tablet Take 1 tablet (100 mg total) by mouth daily. 90 tablet 1   nitroGLYCERIN (NITROSTAT) 0.4 MG SL tablet Place 1 tablet (0.4 mg total) under the tongue every 5 (five) minutes as needed. 30 tablet 11   amLODipine (NORVASC) 10 MG tablet Take 1 tablet (10 mg total) by mouth daily. 90 tablet 1   atorvastatin (LIPITOR) 80 MG tablet Take 1 tablet (80 mg total) by mouth  daily. 90 tablet 1   metoprolol succinate (TOPROL-XL) 100 MG 24 hr tablet Take with or immediately following a meal. 90 tablet 1   albuterol (VENTOLIN HFA) 108 (90 Base) MCG/ACT inhaler Inhale 2 puffs into the lungs every 6 (six) hours as needed for wheezing or shortness of breath. (Patient not taking: Reported on 01/08/2023) 1 each 0   ezetimibe (ZETIA) 10 MG tablet Take 1 tablet (10 mg total) by mouth daily. (Patient not taking: Reported on 10/09/2023) 90 tablet 1   No facility-administered medications prior to visit.    Allergies  Allergen Reactions   Orphenadrine Rash   Tramadol     Per pt: legs kick, trouble coming off   Keflex [Cephalexin] Rash   Penicillins Rash and Other (See Comments)    Angio-edema    Review of Systems As per HPI  PE:    10/09/2023    8:20 AM 09/04/2023   11:14 AM 08/08/2023    3:56 PM  Vitals with BMI  Height  5\' 7"    Weight 207 lbs 10 oz 204 lbs 13 oz 196 lbs 3 oz  BMI  32.07   Systolic 121 120 161  Diastolic 78 74 63  Pulse 60 60 51     Physical Exam  Gen: Alert, well appearing.  Patient is oriented to person, place, time, and situation. AFFECT: pleasant, lucid thought and speech. No further exam today  LABS:  Last CBC Lab Results  Component Value Date   WBC 6.5 07/25/2021   HGB 13.9 07/25/2021   HCT 42.0 07/25/2021   MCV 86.4 07/25/2021   MCH 28.9 08/19/2012   RDW 13.4 07/25/2021   PLT 190.0 07/25/2021   Last metabolic panel Lab Results  Component Value Date   GLUCOSE 535 (HH) 07/09/2023   NA 131 (L) 07/09/2023   K 3.9 07/09/2023   CL 88 (L) 07/09/2023   CO2 28 07/09/2023   BUN 21 07/09/2023   CREATININE 1.30 07/09/2023   GFR 58.14 (L) 07/09/2023   CALCIUM 10.7 (H) 07/09/2023   PROT 7.7 07/09/2023   ALBUMIN 4.5 07/09/2023   BILITOT 2.3 (H) 07/09/2023   ALKPHOS 59 07/09/2023   AST 11 07/09/2023   ALT 17 07/09/2023   Last lipids Lab Results  Component Value Date   CHOL 272 (H) 07/09/2023   HDL 37.20 (L) 07/09/2023    LDLCALC 52 01/08/2023   LDLDIRECT 120.0 07/09/2023   TRIG (H) 07/09/2023    585.0 Triglyceride is over 400; calculations on Lipids are invalid.   CHOLHDL 7 07/09/2023   Last hemoglobin A1c Lab Results  Component Value Date   HGBA1C 7.6 (A) 10/09/2023   HGBA1C 7.6 10/09/2023   HGBA1C  7.6 (A) 10/09/2023   HGBA1C 7.6 (A) 10/09/2023   Last thyroid functions Lab Results  Component Value Date   TSH 1.87 01/08/2023   Lab Results  Component Value Date   VITAMINB12 420 07/09/2023   IMPRESSION AND PLAN:  #1 diabetes without complication, much improved control since getting on insulin. Point-of-care hemoglobin A1c today is 7.6%. Continue Lantus 24 units a day and NovoLog 10 units at each meal. If continues with great control over the next 6 months then we may be able to get him back down on lower doses of insulin and back onto metformin as long as he maintains excellent diet and exercise habits.  #2 hypertension, well-controlled on Toprol-XL 100 mg a day, losartan 100 mg a day, HCTZ 25 mg a day, and amlodipine 10 mg a day.  3.  Hypercholesterolemia, poor control. Lipid results 3 months ago were in the setting of very poorly controlled diabetes. He will continue atorvastatin 80 mg a day and we will recheck lipids today.  4.  Cataract in right eye. He has plans for cataract surgery with Dr. Deirdre Evener office (Dr. Benjamine Mola) in January 2025.  An After Visit Summary was printed and given to the patient.  FOLLOW UP: Return for 3-6 mo for CPE.  Signed:  Santiago Bumpers, MD           10/09/2023

## 2023-11-19 LAB — HM DIABETES EYE EXAM

## 2023-12-08 ENCOUNTER — Other Ambulatory Visit: Payer: Self-pay | Admitting: Family Medicine

## 2024-01-19 ENCOUNTER — Other Ambulatory Visit: Payer: Self-pay | Admitting: Family Medicine

## 2024-02-22 ENCOUNTER — Other Ambulatory Visit: Payer: Self-pay | Admitting: Family Medicine

## 2024-03-18 ENCOUNTER — Encounter: Payer: BLUE CROSS/BLUE SHIELD | Admitting: Family Medicine

## 2024-03-18 NOTE — Progress Notes (Deleted)
 Office Note 03/18/2024  CC: No chief complaint on file.  Patient is a 63 y.o. male who is here for annual health maintenance exam and 94-month follow-up diabetes, hypertension, and hypercholesterolemia.. A/P as of last visit: "1 diabetes without complication, much improved control since getting on insulin. Point-of-care hemoglobin A1c today is 7.6%. Continue Lantus 24 units a day and NovoLog 10 units at each meal. If continues with great control over the next 6 months then we may be able to get him back down on lower doses of insulin and back onto metformin as long as he maintains excellent diet and exercise habits.   #2 hypertension, well-controlled on Toprol-XL 100 mg a day, losartan 100 mg a day, HCTZ 25 mg a day, and amlodipine 10 mg a day.   3.  Hypercholesterolemia, poor control. Lipid results 3 months ago were in the setting of very poorly controlled diabetes. He will continue atorvastatin 80 mg a day and we will recheck lipids today.   4.  Cataract in right eye. He has plans for cataract surgery with Dr. Deirdre Evener office (Dr. Benjamine Mola) in January 2025."  INTERIM HX: ***  Past Medical History:  Diagnosis Date   Coronary artery disease    DES to RCA 2010 in setting of acute event; repeat cath 03/02/10 for chest pain showed widely patent stent and normal LV function.   COVID-19 virus infection 05/04/2021   DDD (degenerative disc disease), lumbar    Diabetes mellitus without complication (HCC) 2019   Diplopia    07/2023   Diverticular disease    h/o 'itis   Fatty liver    Genital warts 06/23/2013   History of pancreatitis    History of peptic ulcer disease    Hyperlipidemia    Atorva 80. Recommended adding zetia 11/2019   Hypertension    Mood disorder (HCC)    per old records; anger and irritability + elevated libido--improved on sertraline.   Renal artery stenosis (HCC) 2011   Discordant data (mild bilat ostial disease on distal aortogram at time of cath, renal arteries  appeared normal.  Then renal doppler u/s showed R>L RAS, renal sizes similar.  Recheck of renal artery dopplers 06/2013 showed no significant changes--repeat in 12 months recommended.   Right flank pain    apparently had a trigger point b/c once the orthopedist injected the region he had no further pain.  (extensive w/u done for this problem prior to the injection, though).    Past Surgical History:  Procedure Laterality Date   COLONOSCOPY  2007   Normal per pt.  Recall 2017 (done in [place], [place]).   CORONARY STENT PLACEMENT  09/14/09   RCA (Dr. Clarene Duke)   KNEE SURGERY  2007   Rt knee arthroscopy   KNEE SURGERY  1983   Bilat knee debridement   PALATE SURGERY  age 66 mo   TYMPANOSTOMY TUBE PLACEMENT  1967    Family History  Problem Relation Age of Onset   Heart attack Father    Hypertension Father    Heart attack Sister    Hypertension Sister    Heart attack Brother    Hypertension Brother    Colon cancer Brother    Diabetes Neg Hx     Social History   Socioeconomic History   Marital status: Married    Spouse name: Not on file   Number of children: Not on file   Years of education: Not on file   Highest education level: Not on file  Occupational  History   Not on file  Tobacco Use   Smoking status: Former    Types: Pipe   Smokeless tobacco: Never   Tobacco comments:    Pipe Only.  Vaping Use   Vaping status: Never Used  Substance and Sexual Activity   Alcohol use: No   Drug use: Yes    Types: Marijuana   Sexual activity: Not on file  Other Topics Concern   Not on file  Social History Narrative   Married, no children.   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   Education: BS from [employer].   No tobacco.  Rare use of marijuana (recreational).  No alcohol or drugs (other than marij).   Exercise: yard work, Systems analyst, [employer] and horseback riding.   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         Social Drivers of Corporate investment banker Strain: Not on file  Food Insecurity: Not on file  Transportation Needs: Not on file  Physical Activity: Not on file  Stress: Not on file  Social Connections: Unknown (04/25/2022)   Received from [facility], [facility]   Social Network    Social Network: Not on file  Intimate Partner Violence: Unknown (03/17/2022)   Received from [facility], [facility]   HITS    Physically Hurt: Not on file    Insult or Talk Down To: Not on file    Threaten Physical Harm: Not on file    Scream or Curse: Not on file    Outpatient Medications Prior to Visit  Medication Sig Dispense Refill   albuterol (VENTOLIN HFA) 108 (90 Base) MCG/ACT inhaler Inhale 2 puffs into the lungs every 6 (six) hours as needed for wheezing or shortness of breath. (Patient not taking: Reported on 01/08/2023) 1 each 0   amLODipine (NORVASC) 10 MG tablet Take 1 tablet (10 mg total) by mouth daily. 90 tablet 1   aspirin 325 MG tablet Take 325 mg by mouth daily.     atorvastatin (LIPITOR) 80 MG tablet Take 1 tablet (80 mg total) by mouth daily. 90 tablet 1   cetirizine (ZYRTEC) 10 MG tablet Take 1 tablet (10 mg total) by mouth daily. 30 tablet 11   fluticasone (FLONASE) 50 MCG/ACT nasal spray Place 2 sprays into both nostrils daily. 16 g 6   hydrochlorothiazide (HYDRODIURIL) 25 MG tablet TAKE 1 TABLET (25 MG TOTAL) BY MOUTH DAILY. 30 tablet 0   insulin glargine (LANTUS SOLOSTAR) 100 UNIT/ML Solostar Pen 24 units SQ qd 15 mL 1   insulin lispro (HUMALOG KWIKPEN) 100 UNIT/ML KwikPen 10 units SQ qAC (Patient taking differently: 12 units SQ qAC) 15 mL 1   ipratropium (ATROVENT) 0.06 % nasal spray Place 2 sprays into both nostrils 4 (four) times daily. 15 mL 12   losartan (COZAAR) 100 MG tablet Take 1 tablet (100 mg total) by mouth daily. 90 tablet 1   metoprolol succinate (TOPROL-XL) 100 MG 24 hr tablet Take 1 tablet (100 mg  total) by mouth daily. Take with or immediately following a meal. 90 tablet 1   nitroGLYCERIN (NITROSTAT) 0.4 MG SL tablet Place 1 tablet (0.4 mg total) under the tongue every 5 (five) minutes as needed. 30 tablet 11   No facility-administered medications prior to visit.    Allergies  Allergen Reactions   Orphenadrine Rash  Tramadol     Per pt: legs kick, trouble coming off   Keflex [Cephalexin] Rash   Penicillins Rash and Other (See Comments)    Angio-edema    Review of Systems *** PE;    10/09/2023    8:20 AM 09/04/2023   11:14 AM 08/08/2023    3:56 PM  Vitals with BMI  Height  5\' 7"    Weight 207 lbs 10 oz 204 lbs 13 oz 196 lbs 3 oz  BMI  32.07   Systolic 121 120 960  Diastolic 78 74 63  Pulse 60 60 51     *** Pertinent labs:  Lab Results  Component Value Date   TSH 1.87 01/08/2023   Lab Results  Component Value Date   WBC 6.5 07/25/2021   HGB 13.9 07/25/2021   HCT 42.0 07/25/2021   MCV 86.4 07/25/2021   PLT 190.0 07/25/2021   Lab Results  Component Value Date   CREATININE 1.08 10/09/2023   BUN 17 10/09/2023   NA 139 10/09/2023   K 4.2 10/09/2023   CL 104 10/09/2023   CO2 30 10/09/2023   Lab Results  Component Value Date   ALT 17 07/09/2023   AST 11 07/09/2023   ALKPHOS 59 07/09/2023   BILITOT 2.3 (H) 07/09/2023   Lab Results  Component Value Date   CHOL 158 10/09/2023   Lab Results  Component Value Date   HDL 47.20 10/09/2023   Lab Results  Component Value Date   LDLCALC 93 10/09/2023   Lab Results  Component Value Date   TRIG 87.0 10/09/2023   Lab Results  Component Value Date   CHOLHDL 3 10/09/2023   Lab Results  Component Value Date   PSA 0.29 01/08/2023   PSA 0.28 07/25/2021   PSA 0.4 05/09/2019   Lab Results  Component Value Date   HGBA1C 7.6 (A) 10/09/2023   HGBA1C 7.6 10/09/2023   HGBA1C 7.6 (A) 10/09/2023   HGBA1C 7.6 (A) 10/09/2023   ASSESSMENT AND PLAN:   No problem-specific Assessment & Plan notes found  for this encounter.  1 health maintenance exam: Reviewed age and gender appropriate health maintenance issues (prudent diet, regular exercise, health risks of tobacco and excessive alcohol, use of seatbelts, fire alarms in home, use of sunscreen).  Also reviewed age and gender appropriate health screening as well as vaccine recommendations. Vaccines: all UTD   Labs: fasting HP, Hba1c, PSA. Prostate ca screening:  PSA today. Colon ca screening:  2007 normal TCS in [place], [place].  Due for recall->***.  An After Visit Summary was printed and given to the patient.  FOLLOW UP:  No follow-ups on file.  Signed:  Santiago Bumpers, MD           03/18/2024

## 2024-03-30 ENCOUNTER — Other Ambulatory Visit: Payer: Self-pay | Admitting: Family Medicine

## 2024-04-03 ENCOUNTER — Encounter: Payer: Self-pay | Admitting: Family Medicine

## 2024-04-04 ENCOUNTER — Encounter: Payer: Self-pay | Admitting: Family Medicine

## 2024-04-21 ENCOUNTER — Other Ambulatory Visit: Payer: Self-pay | Admitting: Family Medicine

## 2024-05-17 ENCOUNTER — Other Ambulatory Visit: Payer: Self-pay | Admitting: Family Medicine

## 2024-05-18 ENCOUNTER — Other Ambulatory Visit: Payer: Self-pay | Admitting: Family Medicine

## 2024-05-19 ENCOUNTER — Other Ambulatory Visit: Payer: Self-pay | Admitting: Family Medicine

## 2024-08-27 ENCOUNTER — Telehealth: Payer: Self-pay

## 2024-08-27 NOTE — Telephone Encounter (Signed)
 Reason for CRM: Patient needs a work note for his employer as he was out of work 9/15-9/16. Callback number for patient is [phone].

## 2024-08-27 NOTE — Telephone Encounter (Signed)
 Spoke with patient regarding work note request for recent dental pain, he contacted his dentist and they declined providing work note for him. He is reaching out to our office to see if PCP can accommodate his request.

## 2024-08-27 NOTE — Telephone Encounter (Deleted)
 Tried calling patient, unable to LVM

## 2024-08-27 NOTE — Telephone Encounter (Addendum)
 Tried calling patient, unable to LVM

## 2024-08-27 NOTE — Telephone Encounter (Signed)
 I did not see him for any medical problem recently so I cannot do a note.

## 2024-08-28 NOTE — Telephone Encounter (Unsigned)
 Copied from CRM [id number]. Topic: General - Other >> Aug 27, 2024  2:37 PM [place] E wrote: Reason for CRM: Patient needs a work note for his employer as he was out of work 9/15-9/16. Callback number for patient is [phone]. >> Aug 28, 2024  2:04 PM [place] J wrote: Patient is calling to follow up on the request for a work not. I was able to let the patient know that Dr. Candise is not willing to complete this request. He expressed understanding on this and has no further questions at this time.

## 2024-09-03 ENCOUNTER — Ambulatory Visit: Admitting: Family Medicine

## 2024-09-03 ENCOUNTER — Encounter: Payer: Self-pay | Admitting: Family Medicine

## 2024-09-03 VITALS — BP 168/77 | HR 68 | Temp 98.0°F | Ht 67.0 in | Wt 207.0 lb

## 2024-09-03 DIAGNOSIS — Z125 Encounter for screening for malignant neoplasm of prostate: Secondary | ICD-10-CM

## 2024-09-03 DIAGNOSIS — R6882 Decreased libido: Secondary | ICD-10-CM

## 2024-09-03 DIAGNOSIS — F4323 Adjustment disorder with mixed anxiety and depressed mood: Secondary | ICD-10-CM

## 2024-09-03 DIAGNOSIS — Z0000 Encounter for general adult medical examination without abnormal findings: Secondary | ICD-10-CM | POA: Diagnosis not present

## 2024-09-03 DIAGNOSIS — I10 Essential (primary) hypertension: Secondary | ICD-10-CM

## 2024-09-03 DIAGNOSIS — E7800 Pure hypercholesterolemia, unspecified: Secondary | ICD-10-CM

## 2024-09-03 DIAGNOSIS — Z794 Long term (current) use of insulin: Secondary | ICD-10-CM | POA: Diagnosis not present

## 2024-09-03 DIAGNOSIS — Z1211 Encounter for screening for malignant neoplasm of colon: Secondary | ICD-10-CM

## 2024-09-03 DIAGNOSIS — E119 Type 2 diabetes mellitus without complications: Secondary | ICD-10-CM

## 2024-09-03 DIAGNOSIS — K029 Dental caries, unspecified: Secondary | ICD-10-CM

## 2024-09-03 LAB — COMPREHENSIVE METABOLIC PANEL WITH GFR
ALT: 13 U/L (ref 0–53)
AST: 12 U/L (ref 0–37)
Albumin: 4.6 g/dL (ref 3.5–5.2)
Alkaline Phosphatase: 44 U/L (ref 39–117)
BUN: 11 mg/dL (ref 6–23)
CO2: 32 meq/L (ref 19–32)
Calcium: 10.2 mg/dL (ref 8.4–10.5)
Chloride: 101 meq/L (ref 96–112)
Creatinine, Ser: 1.12 mg/dL (ref 0.40–1.50)
GFR: 68.96 mL/min (ref >60.00)
Glucose, Bld: 187 mg/dL — ABNORMAL HIGH (ref 70–99)
Potassium: 4.5 meq/L (ref 3.5–5.1)
Sodium: 141 meq/L (ref 135–145)
Total Bilirubin: 2.2 mg/dL — ABNORMAL HIGH (ref 0.2–1.2)
Total Protein: 7.6 g/dL (ref 6.0–8.3)

## 2024-09-03 LAB — LIPID PANEL
Cholesterol: 173 mg/dL (ref 0–200)
HDL: 42.9 mg/dL (ref >39.00)
LDL Cholesterol: 102 mg/dL — ABNORMAL HIGH (ref 0–99)
NonHDL: 129.86
Total CHOL/HDL Ratio: 4
Triglycerides: 140 mg/dL (ref 0.0–149.0)
VLDL: 28 mg/dL (ref 0.0–40.0)

## 2024-09-03 LAB — TESTOSTERONE: Testosterone: 240.68 ng/dL — ABNORMAL LOW (ref 300.00–890.00)

## 2024-09-03 LAB — POCT GLYCOSYLATED HEMOGLOBIN (HGB A1C)
HbA1c POC (<> result, manual entry): 7.7 % (ref 4.0–5.6)
HbA1c, POC (controlled diabetic range): 7.7 % — AB (ref 0.0–7.0)
HbA1c, POC (prediabetic range): 7.7 % — AB (ref 5.7–6.4)
Hemoglobin A1C: 7.7 % — AB (ref 4.0–5.6)

## 2024-09-03 LAB — CBC
HCT: 44.9 % (ref 39.0–52.0)
Hemoglobin: 14.8 g/dL (ref 13.0–17.0)
MCHC: 33 g/dL (ref 30.0–36.0)
MCV: 86.5 fl (ref 78.0–100.0)
Platelets: 169 K/uL (ref 150.0–400.0)
RBC: 5.19 Mil/uL (ref 4.22–5.81)
RDW: 13.5 % (ref 11.5–15.5)
WBC: 6.7 K/uL (ref 4.0–10.5)

## 2024-09-03 LAB — PSA, MEDICARE: PSA: 0.4 ng/mL (ref 0.10–4.00)

## 2024-09-03 LAB — TSH: TSH: 1.62 u[IU]/mL (ref 0.35–5.50)

## 2024-09-03 MED ORDER — METOPROLOL SUCCINATE ER 100 MG PO TB24: 100.0000 mg | ORAL_TABLET | Freq: Every day | ORAL | 1 refills | Status: AC

## 2024-09-03 MED ORDER — LANTUS SOLOSTAR 100 UNIT/ML ~~LOC~~ SOPN: PEN_INJECTOR | SUBCUTANEOUS | 1 refills | Status: AC

## 2024-09-03 MED ORDER — HYDROCODONE-ACETAMINOPHEN 5-325 MG PO TABS: 1.0000 | ORAL_TABLET | Freq: Four times a day (QID) | ORAL | 0 refills | Status: AC | PRN

## 2024-09-03 MED ORDER — ATORVASTATIN CALCIUM 80 MG PO TABS: 80.0000 mg | ORAL_TABLET | Freq: Every day | ORAL | 1 refills | Status: AC

## 2024-09-03 MED ORDER — LOSARTAN POTASSIUM 100 MG PO TABS: 100.0000 mg | ORAL_TABLET | Freq: Every day | ORAL | 1 refills | Status: AC

## 2024-09-03 NOTE — Patient Instructions (Signed)
 Health Maintenance, Male  Adopting a healthy lifestyle and getting preventive care are important in promoting health and wellness. Ask your health care provider about:  The right schedule for you to have regular tests and exams.  Things you can do on your own to prevent diseases and keep yourself healthy.  What should I know about diet, weight, and exercise?  Eat a healthy diet    Eat a diet that includes plenty of vegetables, fruits, low-fat dairy products, and lean protein.  Do not eat a lot of foods that are high in solid fats, added sugars, or sodium.  Maintain a healthy weight  Body mass index (BMI) is a measurement that can be used to identify possible weight problems. It estimates body fat based on height and weight. Your health care provider can help determine your BMI and help you achieve or maintain a healthy weight.  Get regular exercise  Get regular exercise. This is one of the most important things you can do for your health. Most adults should:  Exercise for at least 150 minutes each week. The exercise should increase your heart rate and make you sweat (moderate-intensity exercise).  Do strengthening exercises at least twice a week. This is in addition to the moderate-intensity exercise.  Spend less time sitting. Even light physical activity can be beneficial.  Watch cholesterol and blood lipids  Have your blood tested for lipids and cholesterol at 65 years of age, then have this test every 5 years.  You may need to have your cholesterol levels checked more often if:  Your lipid or cholesterol levels are high.  You are older than 65 years of age.  You are at high risk for heart disease.  What should I know about cancer screening?  Many types of cancers can be detected early and may often be prevented. Depending on your health history and family history, you may need to have cancer screening at various ages. This may include screening for:  Colorectal cancer.  Prostate cancer.  Skin cancer.  Lung  cancer.  What should I know about heart disease, diabetes, and high blood pressure?  Blood pressure and heart disease  High blood pressure causes heart disease and increases the risk of stroke. This is more likely to develop in people who have high blood pressure readings or are overweight.  Talk with your health care provider about your target blood pressure readings.  Have your blood pressure checked:  Every 3-5 years if you are 24-52 years of age.  Every year if you are 3 years old or older.  If you are between the ages of 60 and 72 and are a current or former smoker, ask your health care provider if you should have a one-time screening for abdominal aortic aneurysm (AAA).  Diabetes  Have regular diabetes screenings. This checks your fasting blood sugar level. Have the screening done:  Once every three years after age 66 if you are at a normal weight and have a low risk for diabetes.  More often and at a younger age if you are overweight or have a high risk for diabetes.  What should I know about preventing infection?  Hepatitis B  If you have a higher risk for hepatitis B, you should be screened for this virus. Talk with your health care provider to find out if you are at risk for hepatitis B infection.  Hepatitis C  Blood testing is recommended for:  Everyone born from [birth year] through 1967.  Anyone  with known risk factors for hepatitis C.  Sexually transmitted infections (STIs)  You should be screened each year for STIs, including gonorrhea and chlamydia, if:  You are sexually active and are younger than 65 years of age.  You are older than 65 years of age and your health care provider tells you that you are at risk for this type of infection.  Your sexual activity has changed since you were last screened, and you are at increased risk for chlamydia or gonorrhea. Ask your health care provider if you are at risk.  Ask your health care provider about whether you are at high risk for HIV. Your health care provider  may recommend a prescription medicine to help prevent HIV infection. If you choose to take medicine to prevent HIV, you should first get tested for HIV. You should then be tested every 3 months for as long as you are taking the medicine.  Follow these instructions at home:  Alcohol use  Do not drink alcohol if your health care provider tells you not to drink.  If you drink alcohol:  Limit how much you have to 0-2 drinks a day.  Know how much alcohol is in your drink. In the [place] one drink equals one 12 oz bottle of beer (355 mL), one 5 oz glass of wine (148 mL), or one 1 oz glass of hard liquor (44 mL).  Lifestyle  Do not use any products that contain nicotine or tobacco. These products include cigarettes, chewing tobacco, and vaping devices, such as e-cigarettes. If you need help quitting, ask your health care provider.  Do not use street drugs.  Do not share needles.  Ask your health care provider for help if you need support or information about quitting drugs.  General instructions  Schedule regular health, dental, and eye exams.  Stay current with your vaccines.  Tell your health care provider if:  You often feel depressed.  You have ever been abused or do not feel safe at home.  Summary  Adopting a healthy lifestyle and getting preventive care are important in promoting health and wellness.  Follow your health care provider's instructions about healthy diet, exercising, and getting tested or screened for diseases.  Follow your health care provider's instructions on monitoring your cholesterol and blood pressure.  This information is not intended to replace advice given to you by your health care provider. Make sure you discuss any questions you have with your health care provider.  Document Revised: 04/18/2021 Document Reviewed: 04/18/2021  Elsevier Patient Education  2024 [employer].

## 2024-09-03 NOTE — Telephone Encounter (Signed)
 Copied from CRM #[id number]. Topic: General - Other >> Sep 03, 2024 12:39 PM Burnard DEL wrote: Reason for CRM: Patient was in the office today to see the provider and forgot to ask could he have a work note for the days that he was out of work for the issue that he seen the provider today for? He needs the excuse for  Sep 15 and 16th.  Please call patient when letter is ready to be picked up.   Patient previously requested work note, provider declined because he was not seen for this issue.

## 2024-09-03 NOTE — Progress Notes (Signed)
 Office Note 09/03/2024  CC: No chief complaint on file.  Patient is a 63 y.o. male who is here for annual health maintenance exam and 10 month follow-up hypertension, hyperlipidemia, and diabetes. A/P as of last visit: #1 diabetes without complication, much improved control since getting on insulin . Point-of-care hemoglobin A1c today is 7.6%. Continue Lantus  24 units a day and NovoLog 10 units at each meal. If continues with great control over the next 6 months then we may be able to get him back down on lower doses of insulin  and back onto metformin  as long as he maintains excellent diet and exercise habits.   #2 hypertension, well-controlled on Toprol -XL 100 mg a day, losartan  100 mg a day, HCTZ 25 mg a day, and amlodipine  10 mg a day.   3.  Hypercholesterolemia, poor control. Lipid results 3 months ago were in the setting of very poorly controlled diabetes. He will continue atorvastatin  80 mg a day and we will recheck lipids today.   4.  Cataract in right eye. He has plans for cataract surgery with Dr. Craige office (Dr. Juvenal) in January 2025.  INTERIM HX: Eric Hunter says he is doing okay from a glucose standpoint, says typical fasting is 130.  He admits he does not eat a low carbohydrate diet. He does no formal exercise but sometimes will play golf. He is taking 20 units of Lantus  daily but says he has not used the Humalog .  He says his blood pressure when checked regularly at work has been in the 120s over 70s.  He stopped his amlodipine  due to significant lower extremity edema. The edema has resolved since then. He has run out of Toprol , losartan , and HCTZ so he has not taken it recently.  He has had a couple weeks of significant pain in a tooth.  He has seen a dentist and due to his work schedule he has to wait until 10/01/2024 to get this extracted. He has been using 800 mg ibuprofen  a couple times a day as well as Tylenol  1000 mg a couple times a day.  He voices  significant frustration due to lack of intimacy with his wife. He has a strong libido and normal erectile function and feels affection towards her but she rejects this.  He is interested in talking to a counselor. Otherwise things in his life are going fine and he denies any persistent feeling of hopelessness, anhedonia, or crying spells.  He does express feeling very fatigued after a few days of hard work shifts.  Decreased motivation.  Past Medical History:  Diagnosis Date   Coronary artery disease    DES to RCA 2010 in setting of acute event; repeat cath 03/02/10 for chest pain showed widely patent stent and normal LV function.   COVID-19 virus infection 05/04/2021   DDD (degenerative disc disease), lumbar    Diabetes mellitus without complication (HCC) 2019   Diplopia    07/2023   Diverticular disease    h/o 'itis   Fatty liver    Genital warts 06/23/2013   History of pancreatitis    History of peptic ulcer disease    Hyperlipidemia    Atorva 80. Recommended adding zetia  11/2019   Hypertension    Mood disorder    per old records; anger and irritability + elevated libido--improved on sertraline.   Renal artery stenosis 2011   Discordant data (mild bilat ostial disease on distal aortogram at time of cath, renal arteries appeared normal.  Then renal doppler u/s showed  R>L RAS, renal sizes similar.  Recheck of renal artery dopplers 06/2013 showed no significant changes--repeat in 12 months recommended.   Right flank pain    apparently had a trigger point b/c once the orthopedist injected the region he had no further pain.  (extensive w/u done for this problem prior to the injection, though).    Past Surgical History:  Procedure Laterality Date   COLONOSCOPY  2007   Normal per pt.  Recall 2017 (done in [place], [place]).   CORONARY STENT PLACEMENT  09/14/09   RCA (Dr. Morgan)   KNEE SURGERY  2007   Rt knee arthroscopy   KNEE SURGERY  1983   Bilat knee debridement   PALATE SURGERY   age 70 mo   TYMPANOSTOMY TUBE PLACEMENT  1967    Family History  Problem Relation Age of Onset   Heart attack Father    Hypertension Father    Heart attack Sister    Hypertension Sister    Heart attack Brother    Hypertension Brother    Colon cancer Brother    Diabetes Neg Hx     Social History   Socioeconomic History   Marital status: Married    Spouse name: Not on file   Number of children: Not on file   Years of education: Not on file   Highest education level: Not on file  Occupational History   Not on file  Tobacco Use   Smoking status: Former    Types: Pipe   Smokeless tobacco: Never   Tobacco comments:    Pipe Only.  Vaping Use   Vaping status: Never Used  Substance and Sexual Activity   Alcohol use: No   Drug use: Yes    Types: Marijuana   Sexual activity: Not on file  Other Topics Concern   Not on file  Social History Narrative   Married, no children.   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   Education: BS from [employer].   No tobacco.  Rare use of marijuana (recreational).  No alcohol or drugs (other than marij).   Exercise: yard work, Systems analyst, [employer] and horseback riding.   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         Social Drivers of Corporate investment banker Strain: Not on file  Food Insecurity: Not on file  Transportation Needs: Not on file  Physical Activity: Not on file  Stress: Not on file  Social Connections: Unknown (04/25/2022)   Received from [facility]   Social Network    Social Network: Not on file  Intimate Partner Violence: Unknown (03/17/2022)   Received from [facility]   HITS    Physically Hurt: Not on file    Insult or Talk Down To: Not on file    Threaten Physical Harm: Not on file    Scream or Curse: Not on file    Outpatient Medications Prior to Visit  Medication Sig Dispense Refill    aspirin 325 MG tablet Take 325 mg by mouth daily.     hydrochlorothiazide  (HYDRODIURIL ) 25 MG tablet TAKE 1 TABLET (25 MG TOTAL) BY MOUTH DAILY. 30 tablet 0   insulin  lispro (HUMALOG  KWIKPEN) 100 UNIT/ML KwikPen 10 units SQ qAC (Patient taking differently: 12 units SQ qAC) 15 mL 1   ipratropium (ATROVENT ) 0.06 % nasal spray Place 2 sprays into  both nostrils 4 (four) times daily. 15 mL 12   albuterol  (VENTOLIN  HFA) 108 (90 Base) MCG/ACT inhaler Inhale 2 puffs into the lungs every 6 (six) hours as needed for wheezing or shortness of breath. (Patient not taking: Reported on 09/03/2024) 1 each 0   amLODipine  (NORVASC ) 10 MG tablet Take 1 tablet (10 mg total) by mouth daily. (Patient not taking: Reported on 09/03/2024) 90 tablet 1   nitroGLYCERIN  (NITROSTAT ) 0.4 MG SL tablet Place 1 tablet (0.4 mg total) under the tongue every 5 (five) minutes as needed. (Patient not taking: Reported on 09/03/2024) 30 tablet 11   atorvastatin  (LIPITOR) 80 MG tablet Take 1 tablet (80 mg total) by mouth daily. 90 tablet 1   cetirizine  (ZYRTEC ) 10 MG tablet Take 1 tablet (10 mg total) by mouth daily. 30 tablet 11   fluticasone  (FLONASE ) 50 MCG/ACT nasal spray Place 2 sprays into both nostrils daily. 16 g 6   insulin  glargine (LANTUS  SOLOSTAR) 100 UNIT/ML Solostar Pen INJECT 10 UNITS SUBCUTANEOUSLY EVERY DAY 15 mL 1   losartan  (COZAAR ) 100 MG tablet Take 1 tablet (100 mg total) by mouth daily. 90 tablet 1   metoprolol  succinate (TOPROL -XL) 100 MG 24 hr tablet Take 1 tablet (100 mg total) by mouth daily. Take with or immediately following a meal. 90 tablet 1   No facility-administered medications prior to visit.    Allergies  Allergen Reactions   Orphenadrine Rash   Tramadol     Per pt: legs kick, trouble coming off   Keflex  [Cephalexin ] Rash   Penicillins Rash and Other (See Comments)    Angio-edema    Review of Systems  Constitutional:  Negative for appetite change, chills, fatigue and fever.  HENT:  Negative for  congestion, dental problem, ear pain and sore throat.   Eyes:  Negative for discharge, redness and visual disturbance.  Respiratory:  Negative for cough, chest tightness, shortness of breath and wheezing.   Cardiovascular:  Negative for chest pain, palpitations and leg swelling.  Gastrointestinal:  Negative for abdominal pain, blood in stool, diarrhea, nausea and vomiting.  Genitourinary:  Negative for difficulty urinating, dysuria, flank pain, frequency, hematuria and urgency.  Musculoskeletal:  Negative for arthralgias, back pain, joint swelling, myalgias and neck stiffness.  Skin:  Negative for pallor and rash.  Neurological:  Negative for dizziness, speech difficulty, weakness and headaches.  Hematological:  Negative for adenopathy. Does not bruise/bleed easily.  Psychiatric/Behavioral:  Positive for dysphoric mood. Negative for confusion, sleep disturbance and suicidal ideas. The patient is nervous/anxious.     PE;    09/03/2024   10:31 AM 09/03/2024   10:26 AM 10/09/2023    8:20 AM  Vitals with BMI  Height  5' 7   Weight  207 lbs 207 lbs 10 oz  BMI  32.41   Systolic 168 195 878  Diastolic 77 87 78  Pulse  68 60    Gen: Alert, well appearing.  Patient is oriented to person, place, time, and situation. AFFECT: pleasant, lucid thought and speech. ENT: Ears: EACs clear, normal epithelium.  TMs with good light reflex and landmarks bilaterally.  Eyes: no injection, icteris, swelling, or exudate.  EOMI, PERRLA. Nose: no drainage or turbinate edema/swelling.  No injection or focal lesion.  Mouth: lips without lesion/swelling.  Oral mucosa pink and moist.  Dentition intact and without obvious caries or gingival swelling.  Oropharynx without erythema, exudate, or swelling.  Neck: supple/nontender.  No LAD, mass, or TM.  Carotid pulses 2+ bilaterally, without  bruits. CV: RRR, no m/r/g.   LUNGS: CTA bilat, nonlabored resps, good aeration in all lung fields. ABD: soft, NT, ND, BS normal.   No hepatospenomegaly or mass.  No bruits. EXT: no clubbing, cyanosis, or edema.  Musculoskeletal: no joint swelling, erythema, warmth, or tenderness.  ROM of all joints intact. Skin - no sores or suspicious lesions or rashes or color changes Foot exam -  no swelling, tenderness or skin or vascular lesions. Color and temperature is normal. Sensation is intact. Peripheral pulses are palpable. Toenails are normal.  Pertinent labs:  Lab Results  Component Value Date   TSH 1.87 01/08/2023   Lab Results  Component Value Date   WBC 6.5 07/25/2021   HGB 13.9 07/25/2021   HCT 42.0 07/25/2021   MCV 86.4 07/25/2021   PLT 190.0 07/25/2021   Lab Results  Component Value Date   CREATININE 1.08 10/09/2023   BUN 17 10/09/2023   NA 139 10/09/2023   K 4.2 10/09/2023   CL 104 10/09/2023   CO2 30 10/09/2023   Lab Results  Component Value Date   ALT 17 07/09/2023   AST 11 07/09/2023   ALKPHOS 59 07/09/2023   BILITOT 2.3 (H) 07/09/2023   Lab Results  Component Value Date   CHOL 158 10/09/2023   Lab Results  Component Value Date   HDL 47.20 10/09/2023   Lab Results  Component Value Date   LDLCALC 93 10/09/2023   Lab Results  Component Value Date   TRIG 87.0 10/09/2023   Lab Results  Component Value Date   CHOLHDL 3 10/09/2023   Lab Results  Component Value Date   PSA 0.29 01/08/2023   PSA 0.28 07/25/2021   PSA 0.4 05/09/2019   Lab Results  Component Value Date   HGBA1C 7.7 (A) 09/03/2024   HGBA1C 7.7 09/03/2024   HGBA1C 7.7 (A) 09/03/2024   HGBA1C 7.7 (A) 09/03/2024   ASSESSMENT AND PLAN:   No problem-specific Assessment & Plan notes found for this encounter.  #1 health maintenance exam: Reviewed age and gender appropriate health maintenance issues (prudent diet, regular exercise, health risks of tobacco and excessive alcohol, use of seatbelts, fire alarms in home, use of sunscreen).  Also reviewed age and gender appropriate health screening as well as vaccine  recommendations. Vaccines: Flu-->UTD.  Labs: fasting HP, Hba1c, PSA. Prostate ca screening:  PSA today. Colon ca screening:  2007 normal TCS in [place], [place].  Due for recall->ordered GI referral today.  2 diabetes without complication,  improved control since getting on insulin . Point-of-care hemoglobin A1c today is 7.7%. Continue Lantus  20 units a day and he needs to work harder on diet and exercise. He has Humalog  for as needed mealtime use but has not used it. Feet exam normal today. Check urine microalbumin/creatinine and renal function today.  #3 hypertension, well-controlled on Toprol -XL 100 mg a day, losartan  100 mg a day, and HCTZ 25 mg a day.  He says his blood pressure at home remained normal without the amlodipine . He needs to get back on the Toprol , losartan , and HCTZ--> prescriptions sent today. Check electrolytes and creatinine today.  Nex   3.  Hypercholesterolemia, not ideal control. LDL was 93 back in October 2024. Continue atorvastatin  80 mg a day.  Lipid panel and hepatic panel today.  4.  Adjustment disorder with mixed anxious and depressed mood. Refer to counseling today.  #5 chronic fatigue. He requests testosterone  checked today--> ordered. Checking CBC, c-Met, and TSH today.  6.  Tooth  pain. He has an appointment set with his dentist for tooth extraction on 10/01/2024. I did prescribe #45 Vicodin 5/325 today that he will use sparingly until then. He has been using high doses of Tylenol  and ibuprofen  regularly lately and knows he needs to cut back significantly on these.  An After Visit Summary was printed and given to the patient.  FOLLOW UP:  Return in about 3 months (around 12/03/2024) for routine chronic illness f/u.  Signed:  Gerlene Hockey, MD           09/03/2024

## 2024-09-03 NOTE — Telephone Encounter (Signed)
 That is fine. Please do letter as per patient request.

## 2024-09-03 NOTE — Telephone Encounter (Signed)
 Letter completed and printed. Patient was made aware letter placed in brown folder at the front desk for pick up

## 2024-09-04 ENCOUNTER — Ambulatory Visit: Payer: Self-pay | Admitting: Family Medicine

## 2024-09-04 DIAGNOSIS — R7989 Other specified abnormal findings of blood chemistry: Secondary | ICD-10-CM

## 2024-09-04 LAB — MICROALBUMIN / CREATININE URINE RATIO
Creatinine,U: 62.6 mg/dL
Microalb Creat Ratio: 14.9 mg/g (ref 0.0–30.0)
Microalb, Ur: 0.9 mg/dL (ref 0.0–1.9)

## 2024-09-05 MED ORDER — EZETIMIBE 10 MG PO TABS: 10.0000 mg | ORAL_TABLET | Freq: Every day | ORAL | 2 refills | Status: AC

## 2024-12-17 ENCOUNTER — Encounter: Payer: Self-pay | Admitting: Family Medicine

## 2024-12-17 ENCOUNTER — Ambulatory Visit (INDEPENDENT_AMBULATORY_CARE_PROVIDER_SITE_OTHER): Admitting: Family Medicine

## 2024-12-17 VITALS — BP 138/72 | HR 45 | Temp 99.5°F | Wt 201.2 lb

## 2024-12-17 DIAGNOSIS — J683 Other acute and subacute respiratory conditions due to chemicals, gases, fumes and vapors: Secondary | ICD-10-CM | POA: Diagnosis not present

## 2024-12-17 DIAGNOSIS — R051 Acute cough: Secondary | ICD-10-CM | POA: Diagnosis not present

## 2024-12-17 DIAGNOSIS — J111 Influenza due to unidentified influenza virus with other respiratory manifestations: Secondary | ICD-10-CM

## 2024-12-17 LAB — POCT INFLUENZA A/B
Influenza A, POC: NEGATIVE
Influenza B, POC: NEGATIVE

## 2024-12-17 LAB — POC COVID19 BINAXNOW: SARS Coronavirus 2 Ag: NEGATIVE

## 2024-12-17 MED ORDER — OSELTAMIVIR PHOSPHATE 75 MG PO CAPS: 75.0000 mg | ORAL_CAPSULE | Freq: Two times a day (BID) | ORAL | 0 refills | Status: AC

## 2024-12-17 MED ORDER — PREDNISONE 20 MG PO TABS: ORAL_TABLET | ORAL | 0 refills | Status: AC

## 2024-12-17 NOTE — Progress Notes (Signed)
 OFFICE VISIT  12/17/2024  CC:  Chief Complaint  Patient presents with   Cough    Monday evening pt started having cough, headache and body aches. He did take sudafed today at 4 am.    Patient is a 64 y.o. male who presents for cough.  HPI: Onset 2 days ago--> headache, sinus pressure, postnasal drip, cough, fatigue and generalized bodyaches. No nausea, vomiting, diarrhea, or rash. Mucinex D not helping. 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  Past Medical History:  Diagnosis Date   Coronary artery disease    DES to RCA 2010 in setting of acute event; repeat cath 03/02/10 for chest pain showed widely patent stent and normal LV function.   COVID-19 virus infection 05/04/2021   DDD (degenerative disc disease), lumbar    Diabetes mellitus without complication (HCC) 2019   Diplopia    07/2023   Diverticular disease    h/o 'itis   Fatty liver    Genital warts 06/23/2013   History of pancreatitis    History of peptic ulcer disease    Hyperlipidemia    Atorva 80. Recommended adding zetia  11/2019   Hypertension    Mood disorder    per old records; anger and irritability + elevated libido--improved on sertraline.   Renal artery stenosis 2011   Discordant data (mild bilat ostial disease on distal aortogram at time of cath, renal arteries appeared normal.  Then renal doppler u/s showed R>L RAS, renal sizes similar.  Recheck of renal artery dopplers 06/2013 showed no significant changes--repeat in 12 months recommended.   Right flank pain    apparently had a trigger point b/c once the orthopedist injected the region he had no further pain.  (extensive w/u done for this problem prior to the injection, though).    Past Surgical History:  Procedure Laterality Date   COLONOSCOPY  2007   Normal per pt.  Recall 2017 (done in [place], [place]).   CORONARY STENT PLACEMENT  09/14/09   RCA (Dr. Morgan)   KNEE SURGERY  2007   Rt knee arthroscopy   KNEE SURGERY   1983   Bilat knee debridement   PALATE SURGERY  age 77 mo   TYMPANOSTOMY TUBE PLACEMENT  1967    Outpatient Medications Prior to Visit  Medication Sig Dispense Refill   aspirin 325 MG tablet Take 325 mg by mouth daily.     atorvastatin  (LIPITOR) 80 MG tablet Take 1 tablet (80 mg total) by mouth daily. 90 tablet 1   ezetimibe  (ZETIA ) 10 MG tablet Take 1 tablet (10 mg total) by mouth daily. 30 tablet 2   hydrochlorothiazide  (HYDRODIURIL ) 25 MG tablet TAKE 1 TABLET (25 MG TOTAL) BY MOUTH DAILY. 30 tablet 0   HYDROcodone -acetaminophen  (NORCO/VICODIN) 5-325 MG tablet Take 1 tablet by mouth every 6 (six) hours as needed for moderate pain (pain score 4-6). 45 tablet 0   insulin  glargine (LANTUS  SOLOSTAR) 100 UNIT/ML Solostar Pen INJECT 20 UNITS SUBCUTANEOUSLY EVERY DAY 15 mL 1   ipratropium (ATROVENT ) 0.06 % nasal spray Place 2 sprays into both nostrils 4 (four) times daily. 15 mL 12   losartan  (COZAAR ) 100 MG tablet Take 1 tablet (100 mg total) by mouth daily. 90 tablet 1   metoprolol  succinate (TOPROL -XL) 100 MG 24 hr tablet Take 1 tablet (100 mg total) by mouth daily. Take with or immediately following a meal. 90 tablet 1   nitroGLYCERIN  (NITROSTAT ) 0.4 MG SL tablet Place 1 tablet (  0.4 mg total) under the tongue every 5 (five) minutes as needed. 30 tablet 11   albuterol  (VENTOLIN  HFA) 108 (90 Base) MCG/ACT inhaler Inhale 2 puffs into the lungs every 6 (six) hours as needed for wheezing or shortness of breath. (Patient not taking: Reported on 12/17/2024) 1 each 0   amLODipine  (NORVASC ) 10 MG tablet Take 1 tablet (10 mg total) by mouth daily. (Patient not taking: Reported on 12/17/2024) 90 tablet 1   insulin  lispro (HUMALOG  KWIKPEN) 100 UNIT/ML KwikPen 10 units SQ qAC (Patient taking differently: 12 units SQ qAC) 15 mL 1   No facility-administered medications prior to visit.    Allergies[1]  Review of Systems  As per HPI  PE:    12/17/2024   10:48 AM 09/03/2024   10:31 AM 09/03/2024   10:26 AM   Vitals with BMI  Height   5' 7  Weight 201 lbs 3 oz  207 lbs  BMI   32.41  Systolic 138 168 804  Diastolic 72 77 87  Pulse 45  68  02 sat 93% RA today  Physical Exam  VS: noted--normal. Gen: alert, NAD, NONTOXIC APPEARING. HEENT: eyes without injection, drainage, or swelling.  Ears: EACs clear, TMs with normal light reflex and landmarks.  Nose: Clear rhinorrhea, with some dried, crusty exudate adherent to mildly injected mucosa.  No purulent d/c.  No paranasal sinus TTP.  No facial swelling.  Throat and mouth without focal lesion.  No pharyngial swelling, erythema, or exudate.   Neck: supple, no LAD.   LUNGS: CTA bilat, nonlabored resps.   CV: RRR, no m/r/g. EXT: no c/c/e SKIN: no rash  LABS:  Last CBC Lab Results  Component Value Date   WBC 6.7 09/03/2024   HGB 14.8 09/03/2024   HCT 44.9 09/03/2024   MCV 86.5 09/03/2024   MCH 28.9 08/19/2012   RDW 13.5 09/03/2024   PLT 169.0 09/03/2024   Last metabolic panel Lab Results  Component Value Date   GLUCOSE 187 (H) 09/03/2024   NA 141 09/03/2024   K 4.5 09/03/2024   CL 101 09/03/2024   CO2 32 09/03/2024   BUN 11 09/03/2024   CREATININE 1.12 09/03/2024   GFR 68.96 09/03/2024   CALCIUM  10.2 09/03/2024   PROT 7.6 09/03/2024   ALBUMIN 4.6 09/03/2024   BILITOT 2.2 (H) 09/03/2024   ALKPHOS 44 09/03/2024   AST 12 09/03/2024   ALT 13 09/03/2024   Lab Results  Component Value Date   HGBA1C 7.7 (A) 09/03/2024   HGBA1C 7.7 09/03/2024   HGBA1C 7.7 (A) 09/03/2024   HGBA1C 7.7 (A) 09/03/2024   IMPRESSION AND PLAN:  Acute influenza-like illness. We are in the midst of flu season and he is just within the treatment window for antiviral. Treat with Tamiflu  75 mg twice daily x 5 days. Additionally, he has some signs of RAD today-- I encouraged him to use his albuterol  inhaler at home every 4 hours as needed. 80 mg Depo-Medrol  IM here today. Start prednisone  40 mg a day x 5 days tomorrow. Continue over-the-counter cough  medicine such as Mucinex DM.  An After Visit Summary was printed and given to the patient.  FOLLOW UP: Return if symptoms worsen or fail to improve.  Signed:  Gerlene Hockey, MD           12/17/2024      [1]  Allergies Allergen Reactions   Orphenadrine Rash   Tramadol     Per pt: legs kick, trouble coming off  Keflex  [Cephalexin ] Rash   Penicillins Rash and Other (See Comments)    Angio-edema

## 2024-12-23 MED ORDER — METHYLPREDNISOLONE ACETATE 80 MG/ML IJ SUSP
80.0000 mg | Freq: Once | INTRAMUSCULAR | Status: AC
  Administered 2024-12-17: 80 mg via INTRAMUSCULAR

## 2024-12-23 NOTE — Addendum Note (Signed)
 Addended by: FLETA CARE D on: 12/23/2024 01:54 PM   Modules accepted: Orders
